# Patient Record
Sex: Male | Born: 1940 | Race: White | Hispanic: No | Marital: Married | Smoking: Former smoker
Health system: Southern US, Community
[De-identification: ages and names within clinical notes are randomized; demographics above are authoritative.]

## PROBLEM LIST (undated history)

## (undated) DIAGNOSIS — I82409 Acute embolism and thrombosis of unspecified deep veins of unspecified lower extremity: Secondary | ICD-10-CM

## (undated) DIAGNOSIS — I4891 Unspecified atrial fibrillation: Secondary | ICD-10-CM

## (undated) DIAGNOSIS — I472 Ventricular tachycardia, unspecified: Secondary | ICD-10-CM

## (undated) DIAGNOSIS — E785 Hyperlipidemia, unspecified: Secondary | ICD-10-CM

## (undated) DIAGNOSIS — Z951 Presence of aortocoronary bypass graft: Secondary | ICD-10-CM

## (undated) DIAGNOSIS — T82198A Other mechanical complication of other cardiac electronic device, initial encounter: Secondary | ICD-10-CM

## (undated) DIAGNOSIS — I2510 Atherosclerotic heart disease of native coronary artery without angina pectoris: Secondary | ICD-10-CM

## (undated) DIAGNOSIS — F32A Depression, unspecified: Secondary | ICD-10-CM

## (undated) DIAGNOSIS — I739 Peripheral vascular disease, unspecified: Secondary | ICD-10-CM

## (undated) DIAGNOSIS — Z95810 Presence of automatic (implantable) cardiac defibrillator: Secondary | ICD-10-CM

## (undated) DIAGNOSIS — I4892 Unspecified atrial flutter: Secondary | ICD-10-CM

## (undated) DIAGNOSIS — F329 Major depressive disorder, single episode, unspecified: Secondary | ICD-10-CM

## (undated) DIAGNOSIS — I639 Cerebral infarction, unspecified: Secondary | ICD-10-CM

## (undated) DIAGNOSIS — E669 Obesity, unspecified: Secondary | ICD-10-CM

## (undated) DIAGNOSIS — E119 Type 2 diabetes mellitus without complications: Secondary | ICD-10-CM

## (undated) HISTORY — DX: Cerebral infarction, unspecified: I63.9

## (undated) HISTORY — DX: Atherosclerotic heart disease of native coronary artery without angina pectoris: I25.10

## (undated) HISTORY — DX: Ventricular tachycardia, unspecified: I47.20

## (undated) HISTORY — DX: Unspecified atrial flutter: I48.92

## (undated) HISTORY — PX: CATARACT EXTRACTION: SUR2

## (undated) HISTORY — DX: Ventricular tachycardia: I47.2

## (undated) HISTORY — DX: Presence of automatic (implantable) cardiac defibrillator: Z95.810

## (undated) HISTORY — DX: Peripheral vascular disease, unspecified: I73.9

## (undated) HISTORY — DX: Unspecified atrial fibrillation: I48.91

## (undated) HISTORY — DX: Presence of aortocoronary bypass graft: Z95.1

## (undated) HISTORY — DX: Obesity, unspecified: E66.9

## (undated) HISTORY — PX: AORTIC VALVE REPLACEMENT (AVR)/CORONARY ARTERY BYPASS GRAFTING (CABG): SHX5725

## (undated) HISTORY — DX: Hyperlipidemia, unspecified: E78.5

## (undated) HISTORY — DX: Type 2 diabetes mellitus without complications: E11.9

## (undated) HISTORY — DX: Acute embolism and thrombosis of unspecified deep veins of unspecified lower extremity: I82.409

## (undated) HISTORY — DX: Major depressive disorder, single episode, unspecified: F32.9

## (undated) HISTORY — DX: Depression, unspecified: F32.A

## (undated) HISTORY — DX: Other mechanical complication of other cardiac electronic device, initial encounter: T82.198A

---

## 1995-08-17 HISTORY — PX: CARDIAC CATHETERIZATION: SHX172

## 2001-12-17 ENCOUNTER — Ambulatory Visit (HOSPITAL_COMMUNITY): Admission: RE | Admit: 2001-12-17 | Discharge: 2001-12-17 | Payer: Self-pay | Admitting: Internal Medicine

## 2002-01-01 ENCOUNTER — Encounter: Payer: Self-pay | Admitting: Internal Medicine

## 2002-01-01 ENCOUNTER — Ambulatory Visit (HOSPITAL_COMMUNITY): Admission: RE | Admit: 2002-01-01 | Discharge: 2002-01-01 | Payer: Self-pay | Admitting: Internal Medicine

## 2002-01-02 ENCOUNTER — Ambulatory Visit (HOSPITAL_COMMUNITY): Admission: RE | Admit: 2002-01-02 | Discharge: 2002-01-02 | Payer: Self-pay | Admitting: Internal Medicine

## 2002-06-20 ENCOUNTER — Encounter: Payer: Self-pay | Admitting: Emergency Medicine

## 2002-06-20 ENCOUNTER — Inpatient Hospital Stay (HOSPITAL_COMMUNITY): Admission: EM | Admit: 2002-06-20 | Discharge: 2002-06-22 | Payer: Self-pay | Admitting: Emergency Medicine

## 2002-11-28 ENCOUNTER — Encounter: Payer: Self-pay | Admitting: Internal Medicine

## 2002-11-28 ENCOUNTER — Ambulatory Visit (HOSPITAL_COMMUNITY): Admission: RE | Admit: 2002-11-28 | Discharge: 2002-11-28 | Payer: Self-pay | Admitting: Internal Medicine

## 2008-06-07 ENCOUNTER — Inpatient Hospital Stay (HOSPITAL_COMMUNITY): Admission: EM | Admit: 2008-06-07 | Discharge: 2008-06-08 | Payer: Self-pay | Admitting: Emergency Medicine

## 2008-06-07 ENCOUNTER — Ambulatory Visit: Payer: Self-pay | Admitting: Internal Medicine

## 2008-07-13 ENCOUNTER — Ambulatory Visit: Payer: Self-pay | Admitting: Internal Medicine

## 2008-07-13 LAB — CONVERTED CEMR LAB
BUN: 17 mg/dL (ref 6–23)
Creatinine, Ser: 0.7 mg/dL (ref 0.4–1.5)
GFR calc non Af Amer: 120 mL/min

## 2009-12-16 DIAGNOSIS — I639 Cerebral infarction, unspecified: Secondary | ICD-10-CM

## 2009-12-16 HISTORY — DX: Cerebral infarction, unspecified: I63.9

## 2010-01-03 ENCOUNTER — Ambulatory Visit: Payer: Self-pay | Admitting: Internal Medicine

## 2010-01-03 ENCOUNTER — Inpatient Hospital Stay (HOSPITAL_COMMUNITY): Admission: EM | Admit: 2010-01-03 | Discharge: 2010-01-12 | Payer: Self-pay | Admitting: Emergency Medicine

## 2010-01-04 ENCOUNTER — Encounter: Payer: Self-pay | Admitting: Internal Medicine

## 2010-01-05 ENCOUNTER — Encounter: Payer: Self-pay | Admitting: Internal Medicine

## 2010-01-05 ENCOUNTER — Ambulatory Visit: Payer: Self-pay | Admitting: Physical Medicine & Rehabilitation

## 2010-01-11 ENCOUNTER — Encounter: Payer: Self-pay | Admitting: Internal Medicine

## 2010-01-12 ENCOUNTER — Telehealth (INDEPENDENT_AMBULATORY_CARE_PROVIDER_SITE_OTHER): Payer: Self-pay | Admitting: *Deleted

## 2010-01-13 ENCOUNTER — Encounter: Payer: Self-pay | Admitting: Internal Medicine

## 2010-01-23 ENCOUNTER — Inpatient Hospital Stay (HOSPITAL_COMMUNITY): Admission: EM | Admit: 2010-01-23 | Discharge: 2010-01-26 | Payer: Self-pay | Admitting: Emergency Medicine

## 2010-01-23 ENCOUNTER — Telehealth (INDEPENDENT_AMBULATORY_CARE_PROVIDER_SITE_OTHER): Payer: Self-pay | Admitting: *Deleted

## 2010-01-23 ENCOUNTER — Ambulatory Visit: Payer: Self-pay | Admitting: Internal Medicine

## 2010-01-24 ENCOUNTER — Encounter (INDEPENDENT_AMBULATORY_CARE_PROVIDER_SITE_OTHER): Payer: Self-pay | Admitting: Neurology

## 2010-01-26 ENCOUNTER — Ambulatory Visit: Payer: Self-pay | Admitting: Physical Medicine & Rehabilitation

## 2010-01-26 ENCOUNTER — Inpatient Hospital Stay (HOSPITAL_COMMUNITY)
Admission: RE | Admit: 2010-01-26 | Discharge: 2010-02-06 | Payer: Self-pay | Admitting: Physical Medicine & Rehabilitation

## 2010-02-03 ENCOUNTER — Ambulatory Visit: Payer: Self-pay | Admitting: Physical Medicine & Rehabilitation

## 2010-02-21 ENCOUNTER — Ambulatory Visit (HOSPITAL_COMMUNITY)
Admission: RE | Admit: 2010-02-21 | Discharge: 2010-02-21 | Payer: Self-pay | Admitting: Physical Medicine & Rehabilitation

## 2010-03-08 ENCOUNTER — Encounter
Admission: RE | Admit: 2010-03-08 | Discharge: 2010-05-16 | Payer: Self-pay | Admitting: Physical Medicine & Rehabilitation

## 2010-03-14 ENCOUNTER — Ambulatory Visit: Payer: Self-pay | Admitting: Physical Medicine & Rehabilitation

## 2010-04-18 ENCOUNTER — Telehealth: Payer: Self-pay | Admitting: Internal Medicine

## 2010-04-18 ENCOUNTER — Ambulatory Visit: Payer: Self-pay | Admitting: Internal Medicine

## 2010-04-18 DIAGNOSIS — I4891 Unspecified atrial fibrillation: Secondary | ICD-10-CM

## 2010-04-18 DIAGNOSIS — I472 Ventricular tachycardia: Secondary | ICD-10-CM

## 2010-04-27 ENCOUNTER — Telehealth: Payer: Self-pay | Admitting: Internal Medicine

## 2010-05-16 ENCOUNTER — Ambulatory Visit: Payer: Self-pay | Admitting: Physical Medicine & Rehabilitation

## 2010-05-23 ENCOUNTER — Encounter: Admission: RE | Admit: 2010-05-23 | Discharge: 2010-05-23 | Payer: Self-pay

## 2010-07-20 NOTE — Progress Notes (Signed)
Summary: can pt take a shower -2 days post op?  Phone Note Call from Patient   Caller: Spouse Reason for Call: Talk to Nurse Summary of Call: pt had surgery on 7-26, pt wants to know if he can  take a shower, if so does he need to keep incision covered? pls advise [phone] Initial call taken by: Glynda Jaeger,  January 12, 2010 2:43 PM  Follow-up for Phone Call        No shower for 1 week post implant, wife aware. Follow-up by: Altha Harm, LPN,  January 12, 2010 3:59 PM

## 2010-07-20 NOTE — Miscellaneous (Signed)
Summary: Device change out  Clinical Lists Changes  Observations: Added new observation of ICDEXPLCOMM: 01/10/10 [employer] [id number] explanted (01/13/2010 8:18) Added new observation of ICD INDICATN: ICM (01/13/2010 8:18) Added new observation of ICDLEADSTAT1: active (01/13/2010 8:18) Added new observation of ICDLEADSER1: 147829  (01/13/2010 8:18) Added new observation of ICDLEADMOD1: 0125  (01/13/2010 8:18) Added new observation of ICDLEADDOI1: 09/12/1995  (01/13/2010 8:18) Added new observation of ICDLEADLOC1: RV  (01/13/2010 8:18) Added new observation of ICD IMP MD: Hillis Range, MD  (01/13/2010 8:18) Added new observation of ICD IMPL DTE: 01/10/2010  (01/13/2010 8:18) Added new observation of ICD SERL#: 562130  (01/13/2010 8:18) Added new observation of ICD MODL#: E102  (01/13/2010 8:18) Added new observation of ICDMANUFACTR: [employer]  (01/13/2010 8:18) Added new observation of ICD MD: Hillis Range, MD  (01/13/2010 8:18)       ICD Specifications Following MD:  Hillis Range, MD     ICD Vendor:  [facility] Scientific     ICD Model Number:  E102     ICD Serial Number:  [id number] ICD DOI:  01/10/2010     ICD Implanting MD:  Hillis Range, MD  Lead 1:    Location: RV     DOI: 09/12/1995     Model #: 0125     Serial #: [id number]     Status: active  Indications::  ICM  Explantation Comments: 01/10/10 [facility] Scientific Prizm [id number] explanted

## 2010-07-20 NOTE — Progress Notes (Signed)
Summary: pt has elevated pulse  Phone Note Call from Patient Call back at Home Phone [phone]   Caller: Spouse Reason for Call: Talk to Nurse, Talk to Doctor Summary of Call: pt pulse has been running between 116 - 122 b/p was 103/64 and she was wondering what to do and if they need to be concerned  Initial call taken by: Omer Jack,  April 27, 2010 10:35 AM  Follow-up for Phone Call        spoke w/spouse and she adv the following: 0740 b/p-82/58 hr 122 0845 b/p 120/81 hr 121 1030 bp 103/64 hr 123 pt denies shob, pain, dizziness, or any other symptoms. blood sugar 137. Adv pt to discuss bp changes with cardiologist Dr. York Spaniel office.   Follow-up by: Claris Gladden RN,  April 27, 2010 11:56 AM

## 2010-07-20 NOTE — Cardiovascular Report (Signed)
Summary: Office Visit   Office Visit   Imported By: Roderic Ovens 04/20/2010 16:13:56  _____________________________________________________________________  External Attachment:    Type:   Image     Comment:   External Document

## 2010-07-20 NOTE — Progress Notes (Signed)
Summary: pt not feeling well/cancel today's appt  Phone Note Call from Patient Call back at Home Phone [phone]   Caller: Patient Reason for Call: Talk to Nurse, Talk to Doctor Summary of Call: pt not feeling well and has been vomiting and will go back to hospital around 1pm when daughter comes home Initial call taken by: Omer Jack,  January 23, 2010 10:49 AM  Follow-up for Phone Call        Left message for patient to call and reschedule wound check when he's feeling better. Follow-up by: Altha Harm, LPN,  January 23, 2010 4:32 PM

## 2010-07-20 NOTE — Assessment & Plan Note (Signed)
Summary: pc2/ gd   Visit Type:  ICD-[employer]   History of Present Illness: Tristan Myers is seen in followup for ischemic cardiomyopathy with previously implanted ICD and history of ventricular tachycardia. He is status post recent generator replacement in July.  This was accomplished during the hospitalization associated with a probable embolic stroke. Coumadin was initiated. 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 He is no longer on Coumadin. He continues to have episodes of atrial fibrillation as detected by his device.  He has a history of bypass surgery with ejection fraction of 20-25% and class II congestive failure .    Current Medications (verified): 1)  Aminofen 325 Mg Tabs (Acetaminophen) .... Evert 4 Hours As Needed 2)  Aspirin 325 Mg Tabs (Aspirin) .... Once Daily 3)  Lantus 100 Unit/ml Soln (Insulin Glargine) .... As Directed 4)  Metformin Hcl 1000 Mg Tabs (Metformin Hcl) .... 1/2 Tablet Two Times A Day 5)  Atenolol 50 Mg Tabs (Atenolol) .... Once Daily 6)  Citalopram Hydrobromide 40 Mg Tabs (Citalopram Hydrobromide) .... Once Daily 7)  Gemfibrozil 600 Mg Tabs (Gemfibrozil) .... Two Times A Day 8)  Lipitor 20 Mg Tabs (Atorvastatin Calcium) 9)  Daily Multiple Vitamins  Tabs (Multiple Vitamin) .... Once Daily 10)  Sotalol Hcl 120 Mg Tabs (Sotalol Hcl) .... Two Times A Day 11)  Benazepril Hcl 10 Mg Tabs (Benazepril Hcl) .... 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 He is slow of response HEENT Normal.  Neck veins were flat, carotids were brisk.  Lungs were clear.  Heart sounds were regular with 2/6 murmur Abdomen was soft with active bowel sounds. There is no clubbing cyanosis or edema. Skin Warm and dry     ICD Specifications Following MD:  Sherryl Manges, MD     ICD Vendor:  [facility] Scientific     ICD Model Number:  E102     ICD Serial Number:  [id number] ICD DOI:  01/10/2010     ICD Implanting MD:  Hillis Range, MD  Lead 1:    Location: RV     DOI: 09/12/1995     Model #: 0125     Serial #: [id number]     Status: active  Indications::  ICM  Explantation Comments: 01/10/10 [facility] Scientific Prizm [id number] explanted  ICD Follow Up Remote Check?  No Charge Time:  8.2 seconds     Battery Est. Longevity:  9 years Underlying rhythm:  SR   ICD Device Measurements Right Ventricle:  Amplitude: 13.5 mV, Impedance: 784 ohms, Threshold:  1.7 V at 1.0 msec Shock Impedance: 54 ohms   Episodes Coumadin:  No Shock:  0     ATP:  0     Nonsustained:  1     Ventricular Pacing:  <1%  Brady Parameters Mode VVI     Lower Rate Limit:  40      Tachy Zones VF:  220     VT:  190     VT1:  165     Next Remote Date:  07/20/2010     Next Cardiology Appt Due:  12/17/2010 Tech Comments:  RV reprogrammed 3.0@1 .0.  Latitude transmissions every 3 months.  ROV 7/12 with Dr. Laurena Spies, LPN  April 18, 2010 9:26 AM   Impression & Recommendations:  Problem # 1:  ATRIAL FIBRILLATION (ICD-427.31) he patient continues to have atrial fibrillation.  QT intervals and laboratories are followed by Dr.  Donnie Aho. He is not a candidate for anticoagulation because of his hemorrhagic stroke His updated medication list for this problem includes:    Aspirin 325 Mg Tabs (Aspirin) ..... Once daily    Atenolol 50 Mg Tabs (Atenolol) ..... 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 Once daily    Atenolol 50 Mg Tabs (Atenolol) ..... Once daily    Sotalol Hcl 120 Mg Tabs (Sotalol hcl) .Marland Kitchen..Marland Kitchen Two times a day    Benazepril Hcl 10 Mg Tabs (Benazepril hcl) ..... Once daily  Problem # 4:  CARDIOMYOPATHY, ISCHEMIC S/P CABG (ICD-414.8) stable on current medications His updated medication list for this problem includes:    Aspirin 325 Mg Tabs (Aspirin) ..... Once daily    Atenolol 50 Mg Tabs (Atenolol) ..... Once daily    Sotalol Hcl 120 Mg Tabs (Sotalol hcl) .Marland Kitchen..Marland Kitchen Two times a day    Benazepril Hcl 10 Mg Tabs (Benazepril hcl) ..... Once daily  Problem # 5:  Hx of CVA ISCHEMIC AND HEMORRHAGIC (ICD-434.91) as above His updated medication list for this problem includes:    Aspirin 325 Mg Tabs (Aspirin) ..... Once daily  Patient Instructions: 1)  Your physician recommends that you continue on your current medications as directed. Please refer to the Current Medication list given to you today. 2)  Your physician wants you to follow-up in:   1 YEAR You will receive a reminder letter in the mail two months in advance. If you don't receive a letter, please call our office to schedule the follow-up appointment.

## 2010-07-20 NOTE — Progress Notes (Signed)
Summary: re pt's medication  Phone Note Call from Patient   Caller: Spouse Summary of Call: pt's benzipril is 10mg  Initial call taken by: Glynda Jaeger,  April 18, 2010 12:16 PM  Follow-up for Phone Call        pt confirmed the mg of medication. no action needed.  Follow-up by: Claris Gladden RN,  April 18, 2010 1:54 PM

## 2010-07-21 ENCOUNTER — Encounter: Payer: Self-pay | Admitting: Internal Medicine

## 2010-08-07 ENCOUNTER — Encounter (INDEPENDENT_AMBULATORY_CARE_PROVIDER_SITE_OTHER): Payer: Self-pay | Admitting: *Deleted

## 2010-08-15 NOTE — Cardiovascular Report (Signed)
Summary: Office Visit Remote   Office Visit Remote   Imported By: Roderic Ovens 08/11/2010 11:46:55  _____________________________________________________________________  External Attachment:    Type:   Image     Comment:   External Document

## 2010-08-15 NOTE — Letter (Signed)
Summary: Remote Device Check  [employer], Main Office  [address]   [place], [place] [address]   Phone: [phone]  Fax: [phone]     August 07, 2010 MRN: [id number]   [facility] [address] [place], [place]  [address]   Dear Mr. Saxe,   Your remote transmission was recieved and reviewed by your physician.  All diagnostics were within normal limits for you.  __X____Your next office visit is scheduled for:  July 2012 with Dr Graciela Husbands. Please call our office to schedule an appointment.    Sincerely,  Vella Kohler

## 2010-08-31 LAB — BASIC METABOLIC PANEL
BUN: 16 mg/dL (ref 6–23)
BUN: 25 mg/dL — ABNORMAL HIGH (ref 6–23)
Calcium: 9.7 mg/dL (ref 8.4–10.5)
Chloride: 103 mEq/L (ref 96–112)
Creatinine, Ser: 1.12 mg/dL (ref 0.4–1.5)
GFR calc non Af Amer: >60 mL/min (ref >60)
GFR calc non Af Amer: >60 mL/min (ref >60)
Glucose, Bld: 154 mg/dL — ABNORMAL HIGH (ref 70–99)
Glucose, Bld: 271 mg/dL — ABNORMAL HIGH (ref 70–99)
Potassium: 4.8 mEq/L (ref 3.5–5.1)
Sodium: 134 mEq/L — ABNORMAL LOW (ref 135–145)

## 2010-08-31 LAB — PREALBUMIN: Prealbumin: 24 mg/dL (ref 18.0–45.0)

## 2010-08-31 LAB — CBC
MCH: 31.4 pg (ref 26.0–34.0)
MCHC: 35.1 g/dL (ref 30.0–36.0)
MCV: 89.5 fL (ref 78.0–100.0)
Platelets: 170 10*3/uL (ref 150–400)
RBC: 4.08 MIL/uL — ABNORMAL LOW (ref 4.22–5.81)
RDW: 13.3 % (ref 11.5–15.5)
WBC: 7.8 10*3/uL (ref 4.0–10.5)

## 2010-08-31 LAB — GLUCOSE, CAPILLARY
Glucose-Capillary: 127 mg/dL — ABNORMAL HIGH (ref 70–99)
Glucose-Capillary: 131 mg/dL — ABNORMAL HIGH (ref 70–99)
Glucose-Capillary: 150 mg/dL — ABNORMAL HIGH (ref 70–99)
Glucose-Capillary: 160 mg/dL — ABNORMAL HIGH (ref 70–99)
Glucose-Capillary: 168 mg/dL — ABNORMAL HIGH (ref 70–99)
Glucose-Capillary: 172 mg/dL — ABNORMAL HIGH (ref 70–99)
Glucose-Capillary: 173 mg/dL — ABNORMAL HIGH (ref 70–99)
Glucose-Capillary: 175 mg/dL — ABNORMAL HIGH (ref 70–99)
Glucose-Capillary: 175 mg/dL — ABNORMAL HIGH (ref 70–99)
Glucose-Capillary: 183 mg/dL — ABNORMAL HIGH (ref 70–99)
Glucose-Capillary: 184 mg/dL — ABNORMAL HIGH (ref 70–99)
Glucose-Capillary: 206 mg/dL — ABNORMAL HIGH (ref 70–99)
Glucose-Capillary: 219 mg/dL — ABNORMAL HIGH (ref 70–99)
Glucose-Capillary: 227 mg/dL — ABNORMAL HIGH (ref 70–99)
Glucose-Capillary: 252 mg/dL — ABNORMAL HIGH (ref 70–99)
Glucose-Capillary: 277 mg/dL — ABNORMAL HIGH (ref 70–99)
Glucose-Capillary: 339 mg/dL — ABNORMAL HIGH (ref 70–99)
Glucose-Capillary: 400 mg/dL — ABNORMAL HIGH (ref 70–99)

## 2010-09-01 LAB — COMPREHENSIVE METABOLIC PANEL
ALT: 26 U/L (ref 0–53)
ALT: 29 U/L (ref 0–53)
AST: 33 U/L (ref 0–37)
AST: 35 U/L (ref 0–37)
Albumin: 3.6 g/dL (ref 3.5–5.2)
Alkaline Phosphatase: 70 U/L (ref 39–117)
CO2: 27 mEq/L (ref 19–32)
CO2: 28 mEq/L (ref 19–32)
Calcium: 9.4 mg/dL (ref 8.4–10.5)
Chloride: 99 mEq/L (ref 96–112)
Creatinine, Ser: 0.86 mg/dL (ref 0.4–1.5)
GFR calc Af Amer: >60 mL/min (ref >60)
GFR calc Af Amer: >60 mL/min (ref >60)
GFR calc non Af Amer: >60 mL/min (ref >60)
Glucose, Bld: 164 mg/dL — ABNORMAL HIGH (ref 70–99)
Potassium: 3.7 mEq/L (ref 3.5–5.1)
Potassium: 3.7 mEq/L (ref 3.5–5.1)
Sodium: 134 mEq/L — ABNORMAL LOW (ref 135–145)
Sodium: 134 mEq/L — ABNORMAL LOW (ref 135–145)
Total Bilirubin: 1.2 mg/dL (ref 0.3–1.2)
Total Protein: 7.5 g/dL (ref 6.0–8.3)

## 2010-09-01 LAB — GLUCOSE, CAPILLARY
Glucose-Capillary: 106 mg/dL — ABNORMAL HIGH (ref 70–99)
Glucose-Capillary: 107 mg/dL — ABNORMAL HIGH (ref 70–99)
Glucose-Capillary: 108 mg/dL — ABNORMAL HIGH (ref 70–99)
Glucose-Capillary: 162 mg/dL — ABNORMAL HIGH (ref 70–99)
Glucose-Capillary: 166 mg/dL — ABNORMAL HIGH (ref 70–99)
Glucose-Capillary: 168 mg/dL — ABNORMAL HIGH (ref 70–99)
Glucose-Capillary: 173 mg/dL — ABNORMAL HIGH (ref 70–99)
Glucose-Capillary: 64 mg/dL — ABNORMAL LOW (ref 70–99)
Glucose-Capillary: 70 mg/dL (ref 70–99)
Glucose-Capillary: 81 mg/dL (ref 70–99)
Glucose-Capillary: 83 mg/dL (ref 70–99)
Glucose-Capillary: 92 mg/dL (ref 70–99)

## 2010-09-01 LAB — POCT I-STAT, CHEM 8
BUN: 40 mg/dL — ABNORMAL HIGH (ref 6–23)
Calcium, Ion: 1.14 mmol/L (ref 1.12–1.32)
Creatinine, Ser: 2.5 mg/dL — ABNORMAL HIGH (ref 0.4–1.5)
Glucose, Bld: 141 mg/dL — ABNORMAL HIGH (ref 70–99)
TCO2: 19 mmol/L (ref 0–100)

## 2010-09-01 LAB — URINALYSIS, ROUTINE W REFLEX MICROSCOPIC
Ketones, ur: 15 mg/dL — AB
Leukocytes, UA: NEGATIVE
Nitrite: NEGATIVE
Specific Gravity, Urine: 1.019 (ref 1.005–1.030)
Urobilinogen, UA: 0.2 mg/dL (ref 0.0–1.0)

## 2010-09-01 LAB — URINE MICROSCOPIC-ADD ON

## 2010-09-01 LAB — CBC
HCT: 34.2 % — ABNORMAL LOW (ref 39.0–52.0)
HCT: 37 % — ABNORMAL LOW (ref 39.0–52.0)
HCT: 39 % (ref 39.0–52.0)
Hemoglobin: 11.8 g/dL — ABNORMAL LOW (ref 13.0–17.0)
Hemoglobin: 13.5 g/dL (ref 13.0–17.0)
MCH: 30.8 pg (ref 26.0–34.0)
MCHC: 34.5 g/dL (ref 30.0–36.0)
MCHC: 35.1 g/dL (ref 30.0–36.0)
MCV: 89.8 fL (ref 78.0–100.0)
RDW: 13.3 % (ref 11.5–15.5)
RDW: 13.3 % (ref 11.5–15.5)
RDW: 13.5 % (ref 11.5–15.5)
WBC: 7.4 10*3/uL (ref 4.0–10.5)

## 2010-09-01 LAB — DIFFERENTIAL
Basophils Relative: 1 % (ref 0–1)
Eosinophils Absolute: 0 10*3/uL (ref 0.0–0.7)
Eosinophils Absolute: 0.1 10*3/uL (ref 0.0–0.7)
Eosinophils Relative: 0 % (ref 0–5)
Eosinophils Relative: 1 % (ref 0–5)
Lymphs Abs: 1.5 10*3/uL (ref 0.7–4.0)
Lymphs Abs: 2.2 10*3/uL (ref 0.7–4.0)
Monocytes Absolute: 0.4 10*3/uL (ref 0.1–1.0)
Monocytes Relative: 8 % (ref 3–12)
Neutrophils Relative %: 61 % (ref 43–77)

## 2010-09-01 LAB — PREPARE FRESH FROZEN PLASMA

## 2010-09-01 LAB — PROTIME-INR: INR: 1.66 — ABNORMAL HIGH (ref 0.00–1.49)

## 2010-09-01 LAB — BASIC METABOLIC PANEL
BUN: 29 mg/dL — ABNORMAL HIGH (ref 6–23)
CO2: 22 mEq/L (ref 19–32)
Calcium: 9.1 mg/dL (ref 8.4–10.5)
GFR calc non Af Amer: >60 mL/min (ref >60)
Glucose, Bld: 149 mg/dL — ABNORMAL HIGH (ref 70–99)
Potassium: 3.7 mEq/L (ref 3.5–5.1)

## 2010-09-01 LAB — URINE CULTURE
Colony Count: NO GROWTH
Culture  Setup Time: 201108082311
Culture: NO GROWTH

## 2010-09-01 LAB — HEPATIC FUNCTION PANEL
Alkaline Phosphatase: 66 U/L (ref 39–117)
Bilirubin, Direct: 0.2 mg/dL (ref 0.0–0.3)
Indirect Bilirubin: 0.7 mg/dL (ref 0.3–0.9)
Total Bilirubin: 0.9 mg/dL (ref 0.3–1.2)
Total Protein: 6.5 g/dL (ref 6.0–8.3)

## 2010-09-01 LAB — POCT CARDIAC MARKERS: Troponin i, poc: <0.05 ng/mL (ref 0.00–0.09)

## 2010-09-01 LAB — LIPASE, BLOOD: Lipase: 75 U/L — ABNORMAL HIGH (ref 11–59)

## 2010-09-02 LAB — GLUCOSE, CAPILLARY
Glucose-Capillary: 108 mg/dL — ABNORMAL HIGH (ref 70–99)
Glucose-Capillary: 133 mg/dL — ABNORMAL HIGH (ref 70–99)
Glucose-Capillary: 152 mg/dL — ABNORMAL HIGH (ref 70–99)
Glucose-Capillary: 165 mg/dL — ABNORMAL HIGH (ref 70–99)
Glucose-Capillary: 175 mg/dL — ABNORMAL HIGH (ref 70–99)
Glucose-Capillary: 176 mg/dL — ABNORMAL HIGH (ref 70–99)
Glucose-Capillary: 182 mg/dL — ABNORMAL HIGH (ref 70–99)
Glucose-Capillary: 184 mg/dL — ABNORMAL HIGH (ref 70–99)
Glucose-Capillary: 185 mg/dL — ABNORMAL HIGH (ref 70–99)
Glucose-Capillary: 185 mg/dL — ABNORMAL HIGH (ref 70–99)
Glucose-Capillary: 186 mg/dL — ABNORMAL HIGH (ref 70–99)
Glucose-Capillary: 187 mg/dL — ABNORMAL HIGH (ref 70–99)
Glucose-Capillary: 193 mg/dL — ABNORMAL HIGH (ref 70–99)
Glucose-Capillary: 201 mg/dL — ABNORMAL HIGH (ref 70–99)
Glucose-Capillary: 201 mg/dL — ABNORMAL HIGH (ref 70–99)
Glucose-Capillary: 202 mg/dL — ABNORMAL HIGH (ref 70–99)
Glucose-Capillary: 215 mg/dL — ABNORMAL HIGH (ref 70–99)
Glucose-Capillary: 220 mg/dL — ABNORMAL HIGH (ref 70–99)
Glucose-Capillary: 224 mg/dL — ABNORMAL HIGH (ref 70–99)
Glucose-Capillary: 235 mg/dL — ABNORMAL HIGH (ref 70–99)
Glucose-Capillary: 279 mg/dL — ABNORMAL HIGH (ref 70–99)

## 2010-09-02 LAB — CBC
HCT: 31.5 % — ABNORMAL LOW (ref 39.0–52.0)
HCT: 32.6 % — ABNORMAL LOW (ref 39.0–52.0)
HCT: 32.9 % — ABNORMAL LOW (ref 39.0–52.0)
HCT: 33 % — ABNORMAL LOW (ref 39.0–52.0)
HCT: 33.5 % — ABNORMAL LOW (ref 39.0–52.0)
HCT: 34 % — ABNORMAL LOW (ref 39.0–52.0)
HCT: 37.5 % — ABNORMAL LOW (ref 39.0–52.0)
Hemoglobin: 10.8 g/dL — ABNORMAL LOW (ref 13.0–17.0)
Hemoglobin: 11.1 g/dL — ABNORMAL LOW (ref 13.0–17.0)
Hemoglobin: 11.3 g/dL — ABNORMAL LOW (ref 13.0–17.0)
Hemoglobin: 11.5 g/dL — ABNORMAL LOW (ref 13.0–17.0)
Hemoglobin: 11.6 g/dL — ABNORMAL LOW (ref 13.0–17.0)
Hemoglobin: 12.7 g/dL — ABNORMAL LOW (ref 13.0–17.0)
MCH: 32.1 pg (ref 26.0–34.0)
MCH: 32.4 pg (ref 26.0–34.0)
MCHC: 34.1 g/dL (ref 30.0–36.0)
MCHC: 34.1 g/dL (ref 30.0–36.0)
MCHC: 34.1 g/dL (ref 30.0–36.0)
MCHC: 34.8 g/dL (ref 30.0–36.0)
MCV: 93.9 fL (ref 78.0–100.0)
MCV: 94.3 fL (ref 78.0–100.0)
MCV: 95 fL (ref 78.0–100.0)
MCV: 95.1 fL (ref 78.0–100.0)
Platelets: 239 10*3/uL (ref 150–400)
Platelets: 254 10*3/uL (ref 150–400)
Platelets: 278 10*3/uL (ref 150–400)
RBC: 3.47 MIL/uL — ABNORMAL LOW (ref 4.22–5.81)
RBC: 3.47 MIL/uL — ABNORMAL LOW (ref 4.22–5.81)
RBC: 3.57 MIL/uL — ABNORMAL LOW (ref 4.22–5.81)
RBC: 3.57 MIL/uL — ABNORMAL LOW (ref 4.22–5.81)
RBC: 3.66 MIL/uL — ABNORMAL LOW (ref 4.22–5.81)
RBC: 3.95 MIL/uL — ABNORMAL LOW (ref 4.22–5.81)
RDW: 12.6 % (ref 11.5–15.5)
RDW: 13 % (ref 11.5–15.5)
RDW: 13.1 % (ref 11.5–15.5)
WBC: 12.2 10*3/uL — ABNORMAL HIGH (ref 4.0–10.5)
WBC: 7.3 10*3/uL (ref 4.0–10.5)
WBC: 7.4 10*3/uL (ref 4.0–10.5)
WBC: 8.4 10*3/uL (ref 4.0–10.5)
WBC: 9.3 10*3/uL (ref 4.0–10.5)
WBC: 9.7 10*3/uL (ref 4.0–10.5)

## 2010-09-02 LAB — URINALYSIS, ROUTINE W REFLEX MICROSCOPIC
Glucose, UA: 500 mg/dL — AB
Hgb urine dipstick: NEGATIVE
Ketones, ur: 15 mg/dL — AB
Leukocytes, UA: NEGATIVE
Protein, ur: 100 mg/dL — AB
pH: 6 (ref 5.0–8.0)

## 2010-09-02 LAB — BASIC METABOLIC PANEL
BUN: 10 mg/dL (ref 6–23)
BUN: 12 mg/dL (ref 6–23)
CO2: 24 mEq/L (ref 19–32)
Calcium: 8.6 mg/dL (ref 8.4–10.5)
Calcium: 8.8 mg/dL (ref 8.4–10.5)
Creatinine, Ser: 0.81 mg/dL (ref 0.4–1.5)
GFR calc Af Amer: >60 mL/min (ref >60)
GFR calc Af Amer: >60 mL/min (ref >60)
GFR calc Af Amer: >60 mL/min (ref >60)
GFR calc non Af Amer: >60 mL/min (ref >60)
GFR calc non Af Amer: >60 mL/min (ref >60)
GFR calc non Af Amer: >60 mL/min (ref >60)
Glucose, Bld: 170 mg/dL — ABNORMAL HIGH (ref 70–99)
Potassium: 3.9 mEq/L (ref 3.5–5.1)
Potassium: 4.2 mEq/L (ref 3.5–5.1)
Sodium: 134 mEq/L — ABNORMAL LOW (ref 135–145)

## 2010-09-02 LAB — PROTIME-INR
INR: 1.21 (ref 0.00–1.49)
INR: 1.37 (ref 0.00–1.49)
INR: 1.74 — ABNORMAL HIGH (ref 0.00–1.49)
Prothrombin Time: 14.8 seconds (ref 11.6–15.2)
Prothrombin Time: 16.3 seconds — ABNORMAL HIGH (ref 11.6–15.2)
Prothrombin Time: 16.6 seconds — ABNORMAL HIGH (ref 11.6–15.2)
Prothrombin Time: 16.8 seconds — ABNORMAL HIGH (ref 11.6–15.2)

## 2010-09-02 LAB — LIPID PANEL
Cholesterol: 116 mg/dL (ref 0–200)
LDL Cholesterol: 56 mg/dL (ref 0–99)
Triglycerides: 122 mg/dL (ref <150)

## 2010-09-02 LAB — COMPREHENSIVE METABOLIC PANEL
AST: 25 U/L (ref 0–37)
Albumin: 3.7 g/dL (ref 3.5–5.2)
Calcium: 9.1 mg/dL (ref 8.4–10.5)
Creatinine, Ser: 0.66 mg/dL (ref 0.4–1.5)
GFR calc Af Amer: >60 mL/min (ref >60)
Total Protein: 7.1 g/dL (ref 6.0–8.3)

## 2010-09-02 LAB — DIFFERENTIAL
Basophils Absolute: 0 10*3/uL (ref 0.0–0.1)
Lymphocytes Relative: 9 % — ABNORMAL LOW (ref 12–46)
Lymphs Abs: 1.1 10*3/uL (ref 0.7–4.0)
Neutro Abs: 10.5 10*3/uL — ABNORMAL HIGH (ref 1.7–7.7)

## 2010-09-02 LAB — URINE MICROSCOPIC-ADD ON

## 2010-09-02 LAB — TROPONIN I: Troponin I: 0.01 ng/mL (ref 0.00–0.06)

## 2010-09-02 LAB — APTT: aPTT: 32 seconds (ref 24–37)

## 2010-09-02 LAB — CK TOTAL AND CKMB (NOT AT ARMC)
CK, MB: 0.5 ng/mL (ref 0.3–4.0)
Relative Index: INVALID (ref 0.0–2.5)

## 2010-09-02 LAB — MAGNESIUM: Magnesium: 2.1 mg/dL (ref 1.5–2.5)

## 2010-10-31 NOTE — Discharge Summary (Signed)
[name], Myers NO.:  [id number]   MEDICAL RECORD NO.:  [id number]          PATIENT TYPE:  INP   LOCATION:  2016                         FACILITY:  [facility]   PHYSICIAN:  Georga Hacking, M.D.DATE OF BIRTH:  [birth year]   DATE OF ADMISSION:  06/07/2008  DATE OF DISCHARGE:  06/08/2008                               DISCHARGE SUMMARY   FINAL DIAGNOSES:  1. Implantable defibrillator discharge due to atrial arrhythmia.  2. Coronary artery disease with previous ischemic cardiomyopathy with      ejection fraction of 25-30%.  3. Previous bypass grafting for severe three-vessel coronary artery      disease.  4. History of ventricular tachycardia.  5. Diabetes mellitus.  6. Glaucoma.  7. Hyperlipidemia.   CONSULTATIONS:  Duke Salvia, MD, [facility]   HISTORY:  This 70 year old male has a previous history of coronary  artery disease and ischemic cardiomyopathy in March 1997.  He had severe  three-vessel coronary artery disease and had bypass grafting with the  mammary graft to the LAD, a vein graft to the obtuse marginal, and a  vein graft to the right coronary artery.  He developed ventricular  tachycardia postoperatively and ICD implanted.  He had replacement of  his ICD with a Guidant ICD device in July 2003.  He developed ICD  discharges in 2004, was transiently admitted and placed on increased  beta-blockers and later by sotalol by Dr. Graciela Husbands.  He was recently seen  in November after a long absence when we would not refill his  medications.  At the day of admission, he was out working in the yard  clearing snow and moving some wood and developed midsternal discomfort  that radiated up into his jaw.  He then had a defibrillator discharge  that terminated the pain.  He was brought to the hospital and was  feeling well at the present time and was admitted.  Please see the  previously dictated history and physical for remainder of the details.   HOSPITAL COURSE:   Laboratory data on admission shows a hemoglobin of  11.5 and hematocrit of 35.2.  PT/PTT were normal.  Chemistry panel shows  a glucose of 208, SGOT of 60, hemoglobin A1c is 8.4.  CPK and troponins  were all negative.  EKG showed right bundle-branch block and left axis  deviation.  Chest x-ray showed cardiomegaly.  The patient was admitted  and had no further episodes of defibrillator discharges.  His ICD was  interrogated and it appeared that he had an atrial arrhythmia that was  rapid, then was accelerated by antitachycardia pacemaking, and then  degenerated into ventricular flutter.  Dr. Graciela Husbands saw the patient in  consultation and felt this was the primary mechanism for his arrhythmia.  The patient had not taken his medications the day that all of this  happened.  It was hypothesized that perhaps this could have aggravated a  problem.  He does have some dyspnea with exertion and may need  outpatient evaluation of his coronary status.   Dr. Graciela Husbands reprogrammed his defibrillator to avoid inappropriate shocks  and in addition, programmed the diagnostic modes to determine how much  tachycardia he is having.  He is discharged at this time in improved  condition on:  1. Sotalol 120 mg b.i.d.  2. Atenolol 50 mg b.i.d.  3. Glucotrol 10 mg b.i.d.  4. Metformin 1000 mg b.i.d.  5. Lycopene 150 mg daily.  6. Lipitor 20 mg daily.  7. Niacin 500 mg daily.  8. Benazepril 10 mg daily.  9. Aspirin 81 mg daily.   He is to see Dr. Graciela Husbands in followup in 1 month to determine how much  arrhythmia he is having and is to see me in followup afterwards.      Georga Hacking, M.D.  Electronically Signed     WST/MEDQ  D:  06/08/2008  T:  06/09/2008  Job:  [id number]   cc:   Windle Guard, M.D.  Duke Salvia, MD, [facility]

## 2010-10-31 NOTE — H&P (Signed)
NAME:  Tristan Myers, Tristan Myers NO.:  [id number]   MEDICAL RECORD NO.:  [id number]          PATIENT TYPE:  EMS   LOCATION:  MAJO                         FACILITY:  [facility]   PHYSICIAN:  Georga Hacking, M.D.DATE OF BIRTH:  [birth year]   DATE OF ADMISSION:  06/07/2008  DATE OF DISCHARGE:                              HISTORY & PHYSICAL   REASON FOR ADMISSION:  Defibrillator went off.   HISTORY:  This 70 year old male has a previous history of coronary  artery disease with a non-Q-wave infarction in March 1997.  He underwent  catheterization showing severe three-vessel coronary artery disease and  had bypass graft with a mammary graft to the LAD, a vein graft to the  obtuse marginal and a vein graft to  the right coronary artery.  He had  ventricular tachycardia postoperatively and had an ICD implanted.  He  failed sotalol therapy on initial EP study.  He had replacement of his  ICD with a Guidant ICD device in July 2003.  He developed ICD discharges  in 2004, and was transiently admitted and placed on increased beta-  blockers.  He has had sporadic follow-up due his desire not to come to  the doctor and, in fact, was seen just this fall after a long absence  because we would not refill his medications.  At that time, he still had  battery life remaining on his defibrillator.  Today, he was out working  in the yard clearing snow and pharynx and moving some wood and developed  some mid sternal discomfort that radiated up into his jaw.  He then had  a defibrillator discharge that terminated the pain.  He was brought to  the hospital and is feeling fine at the present time.  His EKG is  nonacute and initial enzymes are negative.  He has not had any recent  episodes of tachycardia or chest pain.   PAST HISTORY:  1. Atherosclerotic coronary vascular disease.  2. Ventricular tachycardia.  3. Peripheral vascular disease.  4. Hyperlipidemia.  5. Glaucoma.  6. COPD.  7. Previous  diabetes.   PAST SURGERY:  Coronary bypass grafting.   ALLERGIES:  NONE.   CURRENT MEDICATIONS:  1. Lipitor 20 mg daily.  2. __________150 mg daily.  3. Atenolol 50 mg daily.  4. Sotalol 120 mg b.i.d.  5. Benazepril 10 mg daily.  6. Lipitor 20 mg daily.   FAMILY HISTORY:  Father died at age 61 of a myocardial infarction.  Mother died at age 7 with diabetes.   SOCIAL HISTORY:  He is currently retired.  He quit smoking in 1997.  Denies alcohol use.  He has been under situational stress with a  daughter in the hospital with severe complications of diabetes.   REVIEW OF SYSTEMS:  He has been obese for several years.  No recent ear,  nose or throat complaints.  No difficulty swallowing.  Mild dyspnea on  exertion.  He has no gastrointestinal complaints.  He has occasional  nocturia.  He denies edema and has some mild arthritis.  No recent  neurologic events.  PHYSICAL EXAMINATION:  GENERAL:  He is an obese male appearing stated  age in no acute distress.  VITAL SIGNS:  Blood pressure 110/60, pulse is 110 and regular.  SKIN:  Warm and dry.  EENT:  EOMI.  PERRLA.  Fundi unremarkable.  Pharynx negative.  CNS:  Clear.  NECK:  Supple.  No masses, JVD, thyromegaly or bruits.  LUNGS:  Clear to A and P.  CARDIAC:  Normal S1-S2.  No S3 or murmur.  ABDOMEN:  Soft, nontender.  EXTREMITIES:  Femoral and distal pulses are 1+.  NEUROLOGIC:  Normal   LABORATORY DATA:  Shows hemoglobin of 11.5, hematocrit 35.2, sodium 136,  potassium 3.5, chloride 104, CO2 of 22, glucose 208.  SGOT 60.  Initial  enzymes negative.   IMPRESSION:  1. Suspected implantable cardioverter-defibrillator discharge.  2. Coronary artery disease with previous bypass grafting and previous      infarction.  3. Hyperlipidemia on treatment.  4. Diabetes mellitus.  5. History of glaucoma.   RECOMMENDATIONS:  Interrogate defibrillator.  Give subcutaneous heparin  for DVT prophylaxis.  Rule out with serial CK-MBs.   Restart medicines as  he missed a dose earlier today.      Georga Hacking, M.D.  Electronically Signed     WST/MEDQ  D:  06/07/2008  T:  06/07/2008  Job:  [id number]   cc:   Windle Guard, M.D.  Duke Salvia, MD, [facility]

## 2010-10-31 NOTE — Assessment & Plan Note (Signed)
[facility]                         ELECTROPHYSIOLOGY OFFICE NOTE   Tristan Myers, Tristan Myers                        MRN:          [id number]  DATE:07/13/2008                            DOB:          Dec 16, 1940    [name] is seen in followup for ventricular tachycardia in the setting  of ischemic heart disease.  He was hospitalized in December when he  presented with ICD discharges.  This turned out to be an atrial  arrhythmia that was accelerated by anti-tachycardia pacing degenerating  ventricular flutter and thus had a secondary shock.  He has had no  intercurrent episodes.   MEDICATIONS:  1. Sotalol 120 b.i.d.  2. Benazepril 10.  3. Atenolol 50 b.i.d.  4. Glucotrol.  5. Lipitor.  6. Metformin.  7. Aspirin.  8. Niacin.   PHYSICAL EXAMINATION:  VITAL SIGNS:  His blood pressure is 121/71, his  pulse is 62.  His weight was 182, which is stable.  LUNGS:  Clear.  NECK:  Veins were flat.  HEART:  Sounds were regular.  EXTREMITIES:  Without edema.   Interrogation of his Guidant Prizm ICD demonstrates an R-wave of 12.2  with impedance of 561, a threshold of 1.2 at 0.4.  Battery voltage 2.58.  There have been no intercurrent episodes.   IMPRESSION:  1. Ischemic heart disease with:      a.     Prior bypass.      b.     Depressed left ventricular function.  She was status post       implantable cardioverter-defibrillator for the above with remote       appropriate therapy and now appropriate therapy from a secondary       ventricular arrhythmia caused by an atrial arrhythmia.   [name] is stable.  We will plan to continue to see him at Dr. York Spaniel  request.  He will be following with Dr. Donnie Aho as scheduled.    Duke Salvia, MD, [facility]  Electronically Signed   SCK/MedQ  DD: 07/13/2008  DT: 07/14/2008  Job #: [id number]   cc:   Georga Hacking, M.D.

## 2010-10-31 NOTE — Consult Note (Signed)
NAME:  FISHER, HARGADON NO.:  [id number]   MEDICAL RECORD NO.:  [id number]          PATIENT TYPE:  INP   LOCATION:  2016                         FACILITY:  [facility]   PHYSICIAN:  Duke Salvia, MD, FACCDATE OF BIRTH:  [birth year]   DATE OF CONSULTATION:  06/08/2008  DATE OF DISCHARGE:  06/08/2008                                 CONSULTATION   Thanks very much for asking [place] to see Tristan Myers on  electrophysiological consultation because of ICD discharge.   HISTORY OF PRESENT ILLNESS:  Tristan Myers is a 70 year old gentleman whose  remote history is not readily available.  He apparently had bypass  surgery in the mid-to-late 90s in the setting of moderate suppression of  LV function.  In the wake of that he had ventricular tachycardia and had  persistently inducible VT despite efforts with sotalol to suppress it.  He underwent ICD implantation.  He was admitted in 2004 for ICD  discharges.  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  Atenolol dose up-  titration was recommended.  There are no other records in the e-chart  system since 2004.  He presented yesterday with ICD discharges.  He had  been out shoveling snow off his car after the snow storm for about a  minute before ICD runoff, he began to feel lightheaded and as if there  were pressure emanating from his gastric area into his chest.  He then  received the ICD discharge and was feeling fine at that point, he has  ruled out for myocardial infarction.   PAST MEDICAL HISTORY:  1. Notable for thromboembolic risk factors including,      a.     Diabetes.      b.     Congestive heart failure.      c.     Age.      d.     Vascular disease.  2. COPD.  3. Peripheral vascular disease.  4. Hyperlipidemia.  5. Glaucoma.   PAST SURGICAL HISTORY:  As noted previously.   REVIEW OF SYSTEMS:  In addition to the above is notable for,  1. GE reflux  disease.  2. 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  Otherwise,      broadly negative across multiple-organ systems.   MEDICATIONS:  On arrival included,  1. Lipitor 20.  2. Atenolol 50 b.i.d.  3. Sotalol 120.  4. Benazepril 10.  5. Glucotrol 10 b.i.d.  6. Aspirin.   ALLERGIES:  He has no known drug allergies.   SOCIAL HISTORY:  He is retired.  He does not use cigarettes, alcohol, or  recreational drugs.  He lives with his wife.  His daughter is 78 years  old and is in hospital with cardiovascular complications of poorly  controlled diabetes.   PHYSICAL EXAMINATION:  GENERAL:  He is an older Caucasian male appearing  somewhat older than his stated age of 30.  VITAL SIGNS:  Blood pressure is 135/89, pulse was 68.  HEENT:  Demonstrated no icterus or xanthomata.  NECK:  His neck veins were flat.  His carotids were brisk and full.  Bruits were not appreciated.  BACK:  Without kyphosis or scoliosis.  LUNGS:  Clear.  HEART:  Sounds were distant, but were regular.  ABDOMEN:  Soft with active bowel sounds without midline pulsation or  hepatomegaly.  EXTREMITIES:  Femoral pulses were 2+.  Distal pulses were intact.  There  is no clubbing, cyanosis, or edema.  SKIN:  Notable for vitiligo as well as and excoriated rash on his left  neck.  NEUROLOGIC:  Grossly normal.   DIAGNOSTIC STUDIES:  Electrocardiogram dated yesterday showed what was  likely sinus rhythm at a rate of 103 with intervals of 0.13/0.14/0.38.  There is right bundle-branch block and left axis deviation.  Conduction  system was unchanged from 10 years before.   Interrogation of his ICD demonstrated atrial fibrillation being  subjected to antitachycardia pacing in the couple of minutes before he  was shocked.  Therapy was withheld following three attempts because end  of episode was declared.  He then had increasing rapid rates, he was  again submitted for ATP, this resulted in development of sustained   monomorphic ventricular tachycardia/flutter with a cycle length of 220  milliseconds.  High voltage shock was delivered terminating ventricular  flutter and restoring sinus rhythm.   IMPRESSION:  1. Atrial fibrillation with a rapid ventricular response.  2. ATP delivered during atrial fibrillation resulting in the      development of fast ventricular tachycardia/ventricular flutter.      This was appropriately treated by the ICD.  3. Previous history of ventricular tachycardia with sustained      monomorphic that was apparently also rate triggered.  4. Sotalol therapy for question indication.  5. Thromboembolic risk factors notable for,      a.     Diabetes.      b.     Congestive heart failure.      c.     Age.      d.     Vascular disease for CHADS VAS score of 4.  6. Coronary artery disease      a.     Status post coronary artery bypass grafting in 1997.      b.     LV dysfunction, question severity.      c.     Peripheral vascular disease.   DISCUSSION:  Tristan Myers had atrial fibrillation that was inappropriately  targeted for therapy by the ICD.  This resulted in proarrhythmia and  development of ventricular flutter.  ICD therapy have probably  terminated the ventricular flutter.   This is the second time that tachycardia has induced tachycardia,  previously was treated with beta-blocker therapy.   There are a number of issues.  1. How do we preventing inappropriate therapy in the setting of atrial      fibrillation.      a.     To that end, I think augmented rate control would be of       benefit.      b.     Device reprogramming has been unaccomplished,  To increase the rate prior to which therapy is delivered.  To activate SVT discriminators.   The other thing as we talked about on the phone just now was to create a  monitoring zone to try to determine how much atrial fibrillation he is  having and to see whether there is sufficient  atrial fibrillation to  warrant  Coumadin.  This is a tricky question with unclear threshold as  to how much atrial fibrillation wants Coumadin, but there are certain  studies that have said 5 minutes of atrial fibrillation in the setting  of CHADS score of more than 2 is associated with the risk of stroke of  45% and thereby may justify Coumadin.   Further review of the record done over the phone with Dr. Donnie Aho may  include that sotalol was started in the spring of 2004 after the patient  had recurrent VT following hospitalization in January 2004 as best as we  can tell that he has had no intercurrent ventricular tachycardia since  the sotalol was started at that time.  His last ejection fraction in the  2003-2004 range was estimated at 30% plus/minus.   Thank you for the consultation.      Duke Salvia, MD, [facility]  Electronically Signed     SCK/MEDQ  D:  06/08/2008  T:  06/09/2008  Job:  [id number]   cc:   Georga Hacking, M.D.  Windle Guard, M.D.

## 2010-11-03 NOTE — Discharge Summary (Signed)
   NAME:  Tristan Myers, STRAKA NO.:  [id number]   MEDICAL RECORD NO.:  [id number]                   PATIENT TYPE:  INP   LOCATION:  3743                                 FACILITY:  [facility]   PHYSICIAN:  Darden Palmer., M.D.         DATE OF BIRTH:  [birth year]   DATE OF ADMISSION:  06/20/2002  DATE OF DISCHARGE:  06/22/2002                                 DISCHARGE SUMMARY   FINAL DIAGNOSES:  1. Ventricular tachycardia with defibrillator discharges.  2. Previous inferior myocardial infarction.  3. Glucose intolerance.   PROCEDURE:  Treadmill test.   CONSULTATIONS:  Dr. Duke Salvia.   HISTORY:  This is a 70 year old male with a previous posterolateral  myocardial infarction 3/97.  He had bypass grafting and postoperatively he  developed sustained ventricular tachycardia treated with an ICD.  He had his  ICD replaced in 7/03 with a guided 1860 device.  He was doing heavy yard  work and felt light headed and the ICD discharged.  The symptoms recurred 15  minutes later and he was again shocked.  He was brought to the emergency  room and was admitted for further evaluation.  Please see the previously  dictated history and physical for the remainder of the details.   LABS AND STUDIES:  Chest x-ray showed cardiomegaly with an ICD.  EKG shows a  sinus rhythm with a right bundle branch block.  Hemoglobin was 13.2,  hematocrit 38.  Sodium is 138, potassium is 4, glucose is 246, BUN is 20,  creatinine 0.8.  CPK and troponins were negative.   HOSPITAL COURSE:  He was observed over night and enzymes were negative.  He  had a consultation by Dr. Sherryl Manges who felt that he had recurrent  ventricular tachycardia which was monomorphic ventricular tachycardia.  He  favored increasing Atenolol prior to initiation of antiarrhythmic drugs.  He  underwent treadmill testing the day of discharge achieving a heart rate of  105 with peak exercise and no ventricular  tachycardia was seen.   MEDICATIONS:  He was discharged home on his medications to include Atenolol  50 mg b.i.d. and to continue his other medications as before, including  Lipitor 20 mg daily, Lotensin 10 mg daily, multivitamins daily, and aspirin  81 mg daily.   FOLLOW UP:  He is to follow up with me in one week.                                                 Darden Palmer., M.D.    WST/MEDQ  D:  08/11/2002  T:  08/12/2002  Job:  [id number]

## 2010-11-03 NOTE — H&P (Signed)
NAME:  Tristan Myers, Tristan Myers NO.:  [id number]   MEDICAL RECORD NO.:  [id number]                   PATIENT TYPE:  EMS   LOCATION:  MAJO                                 FACILITY:  [facility]   PHYSICIAN:  Peter M. [place], M.D.               DATE OF BIRTH:  1941/06/18   DATE OF ADMISSION:  06/20/2002  DATE OF DISCHARGE:                                HISTORY & PHYSICAL   HISTORY OF PRESENT ILLNESS:  The patient is a 70 year old white male with a  history of coronary artery disease status post non-Q-wave myocardial  infarction in March 1997.  Cardiac catheterization done at [facility] showed severe three-vessel coronary disease and depressed LV  function, although the extent of his LV dysfunction is not clear.  He  underwent coronary artery bypass surgery x 3 with LIMA graft to the LAD,  saphenous vein graft to obtuse marginal branch, and saphenous vein graft to  the right coronary artery.  Postoperatively, he developed sustained  ventricular tachycardia and underwent placement of an ICD.  He was not  placed on anti-arrhythmic therapy and, in fact, at initial EP study, he  failed sotalol therapy.  He denies any history of ICD discharges since then,  although in reviewing Dr. Odessa Fleming notes, this suggests there were two prior  episodes, one of ventricular tachycardia and one of ventricular  fibrillation, although it is unclear when these occurred.  His ICD was  replaced in July of 2003 with a Guidant 1860 device.  Today the patient was  doing heavy yard work when he felt lightheaded for a couple of seconds, then  his ICD discharged.  His symptoms recurred again 15 minutes later, and he  again was shocked.  He had no preceding chest pain, shortness of breath, or  syncope.  ICD interrogation shows two episodes of sustained monomorphic  ventricular tachycardia with a rate of 290 beats per minute, both  successfully stopped with ICD shock.  As noted, his heart  rate was up to 120  prior to these episodes.   PAST MEDICAL HISTORY:  1. Atherosclerotic coronary artery disease status post non-Q-wave myocardial     infarction.  He is status post CABG x 3.  2. Ventricular tachycardia status post ICD implant.  3. Peripheral vascular disease.  4. Hypercholesterolemia.  5. History of COPD.  6. Glaucoma.   ALLERGIES:  No known allergies.   CURRENT MEDICATIONS:  1. Lipitor 20 mg per day.  2. Atenolol 50 mg per day.  3. Lotensin 10 mg per day.  4. Aspirin daily.  5. Multivitamins daily.   FAMILY HISTORY:  Father died at age 69 of myocardial infarction.  Mother  died at age 68 with diabetes.   SOCIAL HISTORY:  The patient quit smoking in 1997, denies alcohol.   PHYSICAL EXAMINATION:  GENERAL:  The patient is a well-developed white male  in  no apparent distress.  VITAL SIGNS:  Blood pressure 116/53, pulse 87 and regular, respirations 18,  temperature afebrile.  HEENT:  Exam is unremarkable.  NECK:  No JVD or bruits.  LUNGS:  Clear.  CARDIAC:  No gallops, murmurs, or rubs.  He has an ICD implanted in his left  subclavicular area.  ABDOMEN:  Soft and nontender.  He has no masses or bruits.  EXTREMITIES:  Without edema.  Pulses 1+ and symmetric.  NEUROLOGIC:  Intact.   LABORATORY DATA:  White count 9700, hemoglobin 13.2, hematocrit 38,  platelets 184,000.   Chest x-ray shows no active disease.  ICD leads are in place.   ECG shows normal sinus rhythm with left axis deviation and right bundle  branch block.   IMPRESSION:  1. Sustained ventricular tachycardia status post ICD discharge x 2.  2. Atherosclerotic coronary artery disease status post coronary artery     bypass grafting x 3.  3. Hyperlipidemia.  4. Peripheral vascular disease.   PLAN:  The patient will be admitted to telemetry.  Will check serial cardiac  enzymes when increases atenolol dose.  Dr. Sherryl Manges has been consulted.  Question need to add additional anti-arrhythmic  drug therapy.                                               Peter M. [place], M.D.    PMJ/MEDQ  D:  06/20/2002  T:  06/20/2002  Job:  [id number]   cc:   Duke Salvia, M.D. [facility]   W. Ashley Royalty., M.D.  [address], [address]  [place]  [place] [address]  Fax: [phone]

## 2010-11-03 NOTE — Procedures (Signed)
[place]  Patient:    TYR, FRANCA Visit Number: [id number] MRN: [id number]          Service Type: CAT Location: [facility] 2853 [id number] Attending Physician:  Nathen May Dictated by:   Nathen May, M.D., [facility] Proc. Date: 12/17/01 Admit Date:  12/17/2001 Discharge Date: 12/17/2001   CC:         Darden Palmer., M.D.  Electrophysiology Laboratory  [facility]   Procedure Report  PREOPERATIVE DIAGNOSIS:  Ventricular tachycardia with a history of coronary artery disease, status post bypass surgery, whose defibrillator has now reached end of life.  POSTOPERATIVE DIAGNOSIS:  Ventricular tachycardia with a history of coronary artery disease, status post bypass surgery, whose defibrillator has now reached end of life.  PROCEDURE:  Defibrillator explantation from the submuscular pocket and defibrillator reimplantation, with intraoperative defibrillation threshold testing.  DESCRIPTION OF PROCEDURE:  Following the obtaining of informed consent, Mr. Martensen was brought to the cardiac catheterization laboratory and placed on the fluoroscopic table in the supine position.  After routine prep and drape, lidocaine was infiltrated along the line of the previous incision and the incision was made, was extended and carried down to the level of the pectoral fascia using sharp dissection.  The pectoral muscles were then carefully separated in the line of the defibrillator and mobilized so as to expose the defibrillator.  The defibrillator had moved laterally and caudally from its initial insertion, and the leads were exposed at the cephalic portion of the pocket.  The leads were freed up and then the incision had to be extended laterally to allow for access to the pocket.  The pocket was then opened and the defibrillator was explanted.  Because of the submuscular nature of this and the displacement of the generator  inferolaterally, this required extensive dissection.  The defibrillator was ultimately freed up.  The lead was analyzed and the R-wave was 13 millivolts with a pacing impedance of 539 Ohms, a pacing threshold at 0.5 msec at 1.4 volts and a currented threshold of 1.9 MA.  The proximal coil unipolar impedance was 560 and the distal coil impedance was 520.  With these acceptable parameters recorded, the pocket was copiously irrigated with antibiotic-containing saline solution and hemostasis was assured, and Surgicel was placed in the cephalad portion of the pocket because of oozing. The leads were then attached to a Guidant Prizm II-VR ICD, model 180, serial number [id number].  Through the device the bipolar R-wave was 8 millivolts with an impedance of 516 Ohms and a pacing threshold of 1.4 volts at 0.5 msec, and high-voltage impedance was 51 Ohms.  It should be parenthetically noted that the leads were freed up from the extensive scar tissue and that as they were freed up, it was noted that there was a hair-turn lie of the leads, creating an S appearance on the cephalad portion of the pocket.  This was expanded but not straightened gently.  With this having been accomplished, the leads and the pulse generator were then placed in the pocket and secured to the medial aspect of the pocket so as to prevent migration.  Ventricular fibrillation was induced via the T-wave shock.  After a total duration of seven seconds, an 11-joule shock was delivered through a measured resistance of 42 Ohms, terminating ventricular fibrillation and restoring sinus rhythm.  At this point the system was implanted.  The pectoral muscles were loosely apposed and the wound was then  closed in three layers in the normal fashion. A Surgicel was applied between the first and second layers of closure, and pressure was applied to make sure that the hemostasis was adequate.  This appeared to be fine, and the last layer was  then closed.  The patient tolerated the procedure well and without difficulty.  The patients defibrillator was reprogrammed as previously, and will be transferred to the holding area in stable condition. Dictated by:   Nathen May, M.D., [facility] Attending Physician:  Nathen May DD:  12/17/01 TD:  12/20/01 Job: [id number]

## 2010-11-23 ENCOUNTER — Encounter: Payer: Self-pay | Admitting: Internal Medicine

## 2011-01-02 ENCOUNTER — Encounter: Payer: Self-pay | Admitting: Internal Medicine

## 2011-01-02 ENCOUNTER — Ambulatory Visit (INDEPENDENT_AMBULATORY_CARE_PROVIDER_SITE_OTHER): Payer: Medicare Other | Admitting: Internal Medicine

## 2011-01-02 DIAGNOSIS — T82198A Other mechanical complication of other cardiac electronic device, initial encounter: Secondary | ICD-10-CM

## 2011-01-02 DIAGNOSIS — I255 Ischemic cardiomyopathy: Secondary | ICD-10-CM | POA: Insufficient documentation

## 2011-01-02 DIAGNOSIS — I4891 Unspecified atrial fibrillation: Secondary | ICD-10-CM

## 2011-01-02 DIAGNOSIS — I472 Ventricular tachycardia: Secondary | ICD-10-CM

## 2011-01-02 DIAGNOSIS — Z95810 Presence of automatic (implantable) cardiac defibrillator: Secondary | ICD-10-CM

## 2011-01-02 DIAGNOSIS — I2589 Other forms of chronic ischemic heart disease: Secondary | ICD-10-CM

## 2011-01-02 NOTE — Assessment & Plan Note (Signed)
Histogram data suggests some intercurrent atrial fibrillation

## 2011-01-02 NOTE — Patient Instructions (Signed)
Your physician wants you to follow-up in: 1 year  You will receive a reminder letter in the mail two months in advance. If you don't receive a letter, please call our office to schedule the follow-up appointment.  Your physician recommends that you continue on your current medications as directed. Please refer to the Current Medication list given to you today.  

## 2011-01-02 NOTE — Assessment & Plan Note (Signed)
Ventricular pacing threshold has doubled since last year. It is less likely a interface issue given the chronicity of the lead. I worry more about her problem near the header. Impedances are all right at this time as his sensing. We will continue to observe

## 2011-01-02 NOTE — Progress Notes (Signed)
  HPI  Tristan Myers is a 70 y.o. male followup for ischemic cardiomyopathy with previously implanted ICD and history of ventricular tachycardia. He is status post recent generator replacement in July 2011 This was accomplished during the hospitalization associated with a probable embolic stroke. Coumadin was initiated. He failed to followup and he subsequenttly  had a hemorrhagic stroke. He is no longer on Coumadin. He continues to have episodes of atrial fibrillation as suggested by his device.  He has a history of bypass surgery with ejection fraction of 20-25% and class II congestive failure .  The patient denies chest pain, shortness of breath, nocturnal dyspnea, orthopnea or peripheral edema.  There have been no palpitations, lightheadedness or syncope.    Past Medical History  Diagnosis Date  . CAD (coronary artery disease)     s/p ICD implant in 1997  . S/P CABG x 1   . Ventricular tachycardia     Hx of it  . 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 Diabetes mellitus, type 2   . PVD (peripheral vascular disease)   . Glaucoma   . 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  Current Outpatient Prescriptions  Medication Sig Dispense Refill  . acetaminophen (TYLENOL) 325 MG tablet Take 650 mg by mouth every 4 (four) hours as needed.        Marland Kitchen aspirin 325 MG tablet Take 325 mg by mouth daily.        Marland Kitchen atenolol (TENORMIN) 50 MG tablet Take 25 mg by mouth daily.       Marland Kitchen atorvastatin (LIPITOR) 20 MG tablet Take 20 mg by mouth daily.        . benazepril (LOTENSIN) 10 MG tablet Take 5 mg by mouth daily.       . citalopram (CELEXA) 40 MG tablet Take 20 mg by mouth daily.       Marland Kitchen gemfibrozil (LOPID) 600 MG tablet Take 600 mg by mouth 2 (two) times daily.        . insulin glargine (LANTUS) 100 UNIT/ML injection Inject 23 Units into the skin at bedtime. UAD      . metFORMIN (GLUMETZA) 1000 MG  (MOD) 24 hr tablet Take 500 mg by mouth 2 (two) times daily.        . Multiple Vitamin (MULTIVITAMIN) tablet Take 1 tablet by mouth daily.        . sotalol (BETAPACE) 120 MG tablet Take 120 mg by mouth 2 (two) times daily.          Allergies  Allergen Reactions  . Betadine (Povidone Iodine)     Review of Systems negative except from HPI and PMH  Physical Exam Well developed and well nourished in no acute distress woth pursed lip breathing HENT normal E scleral and icterus clear Neck Supple JVP 6-7 cm carotids brisk and full Clear to ausculation Regular rate and rhythm, no murmurs gallops or rub Soft with active bowel sounds No clubbing cyanosis and edema Alert and oriented, grossly normal motor and sensory function Skin Warm and Dry    Assessment and  Plan

## 2011-01-02 NOTE — Assessment & Plan Note (Signed)
Nonsustained ventricular tachycardia identified on monitor. He continues on sotalol. Potassium was 4.2 in March.

## 2011-01-02 NOTE — Assessment & Plan Note (Signed)
Stable on current medication.  

## 2011-01-02 NOTE — Assessment & Plan Note (Signed)
The patient's device was interrogated and the information was fully reviewed.  The device was reprogrammed to Account for the increasing ventricular lead pacing threshold

## 2011-03-20 ENCOUNTER — Other Ambulatory Visit: Payer: Self-pay | Admitting: Cardiology

## 2011-03-20 ENCOUNTER — Ambulatory Visit
Admission: RE | Admit: 2011-03-20 | Discharge: 2011-03-20 | Disposition: A | Discharge status: Home / Self Care | Payer: Medicare Other | Source: Ambulatory Visit | Attending: Cardiology | Admitting: Cardiology

## 2011-03-20 DIAGNOSIS — Z95810 Presence of automatic (implantable) cardiac defibrillator: Secondary | ICD-10-CM

## 2011-03-22 LAB — COMPREHENSIVE METABOLIC PANEL
ALT: 45 U/L (ref 0–53)
AST: 60 U/L — ABNORMAL HIGH (ref 0–37)
CO2: 22 mEq/L (ref 19–32)
Calcium: 9.2 mg/dL (ref 8.4–10.5)
GFR calc Af Amer: >60 mL/min (ref >60)
GFR calc non Af Amer: >60 mL/min (ref >60)
Potassium: 3.5 mEq/L (ref 3.5–5.1)
Sodium: 136 mEq/L (ref 135–145)
Total Protein: 6.8 g/dL (ref 6.0–8.3)

## 2011-03-22 LAB — CBC
MCHC: 32.8 g/dL (ref 30.0–36.0)
RBC: 3.68 MIL/uL — ABNORMAL LOW (ref 4.22–5.81)

## 2011-03-22 LAB — DIFFERENTIAL
Eosinophils Absolute: 0.1 10*3/uL (ref 0.0–0.7)
Eosinophils Relative: 1 % (ref 0–5)
Lymphs Abs: 1.4 10*3/uL (ref 0.7–4.0)
Monocytes Relative: 5 % (ref 3–12)

## 2011-03-22 LAB — CARDIAC PANEL(CRET KIN+CKTOT+MB+TROPI)
Relative Index: INVALID (ref 0.0–2.5)
Total CK: 46 U/L (ref 7–232)
Total CK: 46 U/L (ref 7–232)

## 2011-03-22 LAB — B-NATRIURETIC PEPTIDE (CONVERTED LAB): Pro B Natriuretic peptide (BNP): 243 pg/mL — ABNORMAL HIGH (ref 0.0–100.0)

## 2011-03-22 LAB — CK TOTAL AND CKMB (NOT AT ARMC)
CK, MB: 1 ng/mL (ref 0.3–4.0)
Relative Index: INVALID (ref 0.0–2.5)
Total CK: 60 U/L (ref 7–232)

## 2011-03-22 LAB — HEMOGLOBIN A1C: Hgb A1c MFr Bld: 8.4 % — ABNORMAL HIGH (ref 4.6–6.1)

## 2011-04-05 ENCOUNTER — Encounter: Payer: No Typology Code available for payment source | Admitting: *Deleted

## 2011-04-09 ENCOUNTER — Encounter: Payer: Self-pay | Admitting: *Deleted

## 2011-04-19 ENCOUNTER — Other Ambulatory Visit: Payer: Self-pay | Admitting: Internal Medicine

## 2011-04-19 ENCOUNTER — Ambulatory Visit (INDEPENDENT_AMBULATORY_CARE_PROVIDER_SITE_OTHER): Payer: Medicare Other | Admitting: *Deleted

## 2011-04-19 DIAGNOSIS — Z95810 Presence of automatic (implantable) cardiac defibrillator: Secondary | ICD-10-CM

## 2011-04-19 DIAGNOSIS — I472 Ventricular tachycardia: Secondary | ICD-10-CM

## 2011-04-22 LAB — REMOTE ICD DEVICE
BRDY-0002RV: 40 {beats}/min
DEVICE MODEL ICD: 271249
FVT: 0
HV IMPEDENCE: 56 Ohm
PACEART VT: 0
TOT-0006: 20120802000000
TZAT-0001FASTVT: 1
TZAT-0001SLOWVT: 2
TZAT-0002FASTVT: NEGATIVE
TZAT-0002SLOWVT: NEGATIVE
TZAT-0018FASTVT: NEGATIVE
TZAT-0018SLOWVT: NEGATIVE
TZON-0003FASTVT: 315.8 ms
TZON-0003SLOWVT: 363.6 ms
TZST-0001FASTVT: 3
TZST-0001FASTVT: 4
TZST-0001FASTVT: 5
TZST-0001SLOWVT: 3
TZST-0001SLOWVT: 6
TZST-0002SLOWVT: NEGATIVE
TZST-0002SLOWVT: NEGATIVE
TZST-0003FASTVT: 41 J
TZST-0003FASTVT: 41 J
TZST-0003FASTVT: 41 J
TZST-0003FASTVT: 41 J
VENTRICULAR PACING ICD: 0 pct
VF: 0

## 2011-05-04 NOTE — Progress Notes (Signed)
icd remote check  

## 2011-06-05 ENCOUNTER — Encounter: Payer: Self-pay | Admitting: Internal Medicine

## 2011-07-26 ENCOUNTER — Encounter: Payer: Medicare Other | Admitting: *Deleted

## 2011-08-12 IMAGING — CR DG CHEST 1V PORT
1 series · 1 of 1 positions shown · non-contrast
Comparison: 06/07/2008

CLINICAL DATA: Weakness, confusion, stroke

PORTABLE CHEST - 1 VIEW

[AP]
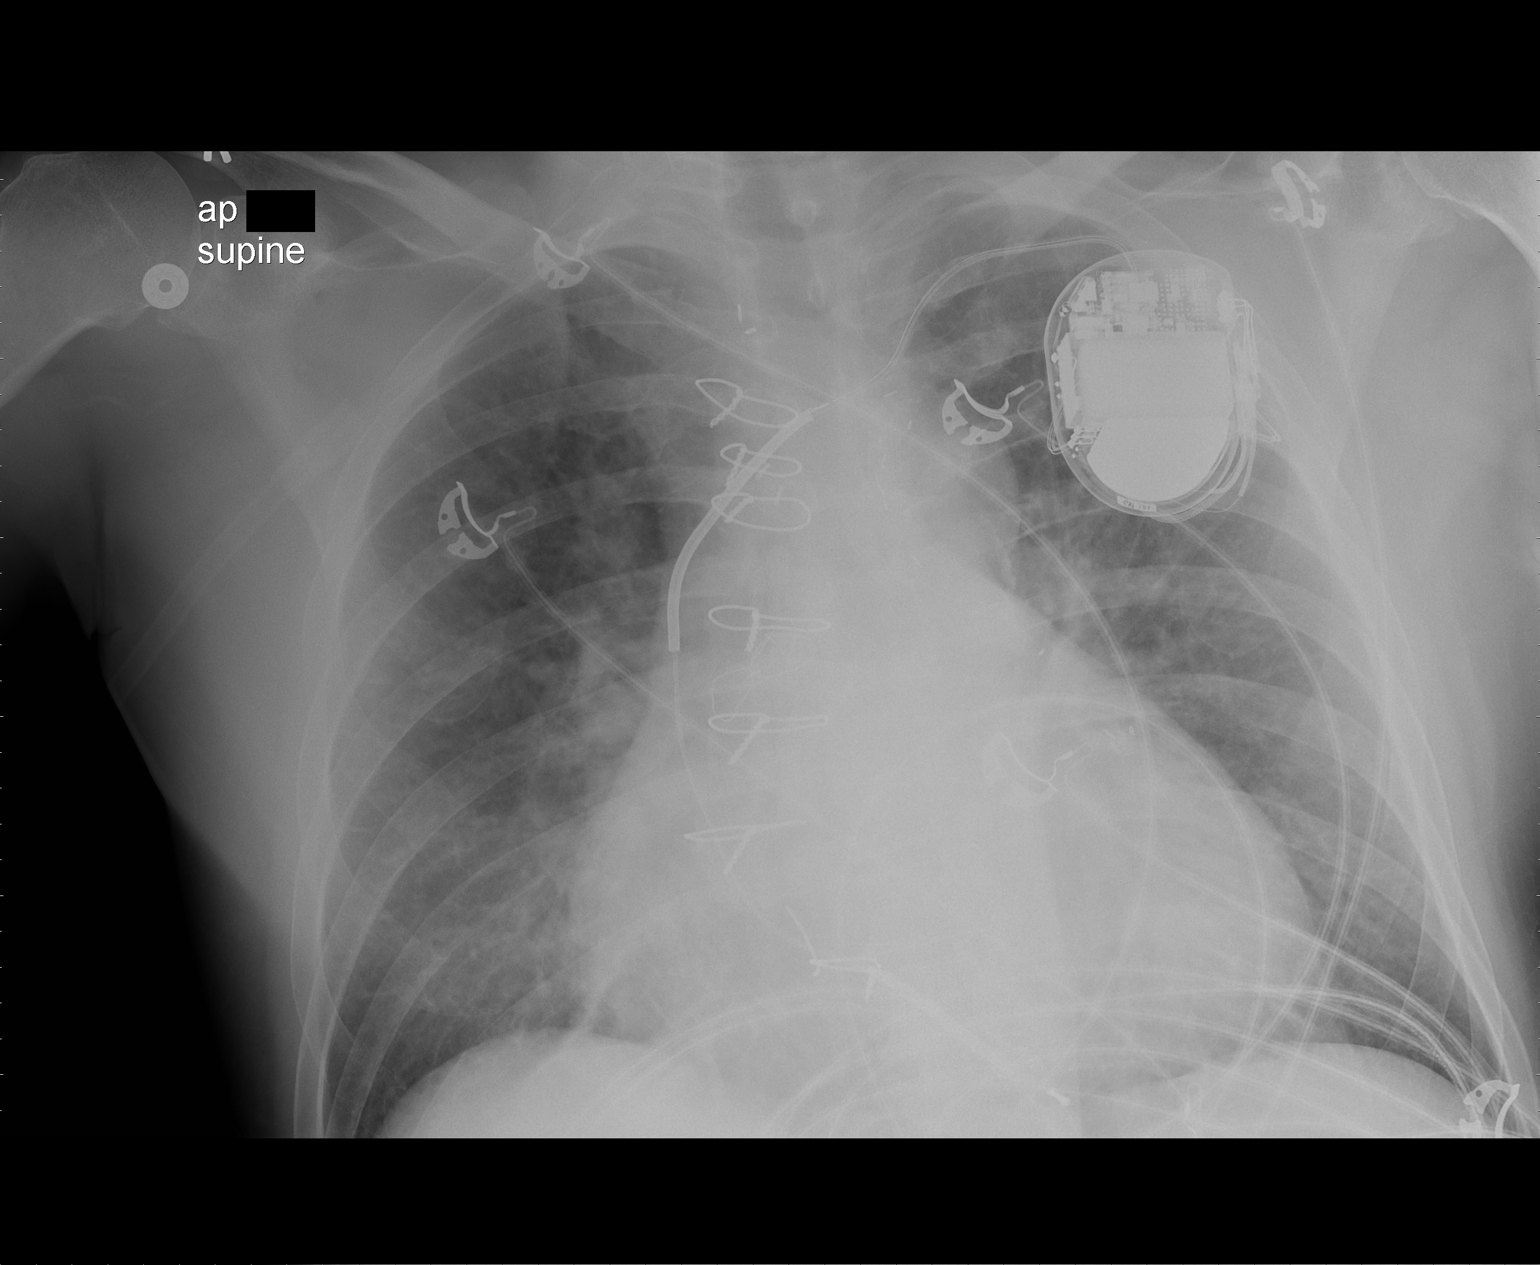

[1 of 1 positions shown; findings below may reference images not displayed]

FINDINGS: Left AICD noted.  Evidence of CABG.  Enlargement of the
cardiomediastinal silhouette is stable with central vascular
congestion and possible interstitial edema but no focal pulmonary
opacity otherwise.  Linear right lower lobe probable scarring or
atelectasis is stable. The patient is rotated to the right, which
may account for apparent displacement of sternal wires, although
correlate with any symptoms referable to this area if there is
concern for sternal dehiscence.
IMPRESSION: Apparent cardiomegaly with central vascular congestion and possible
early interstitial edema.

## 2011-08-20 IMAGING — CR DG CHEST 2V
1 series · 1 of 1 positions shown · non-contrast
Comparison: Portable chest x-ray of 01/03/2010

CLINICAL DATA: Post pacer insertion

CHEST - 2 VIEW

[view not recorded]
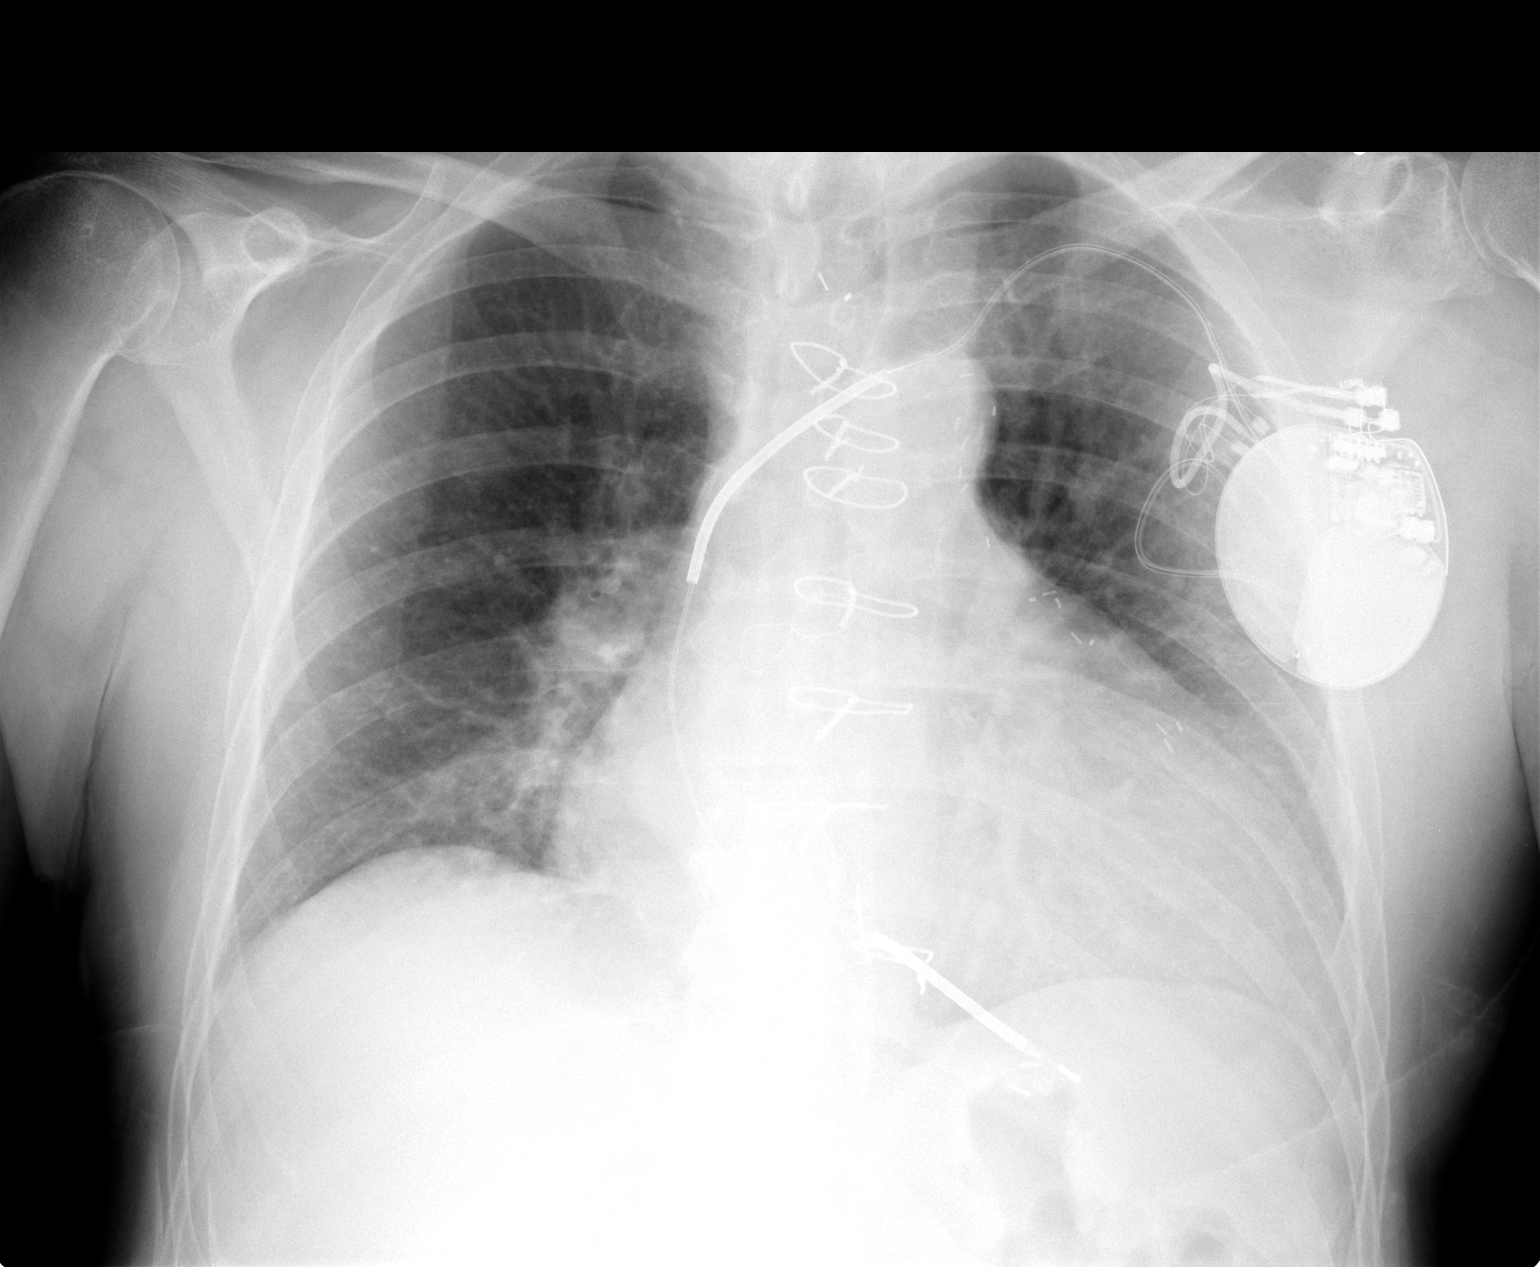

[1 of 1 positions shown; findings below may reference images not displayed]

FINDINGS: The lungs appear better aerated and the pulmonary
vascular congestion has improved.  Moderate cardiomegaly is stable
with mild pulmonary vascular congestion remaining.  A pacer with
AICD lead remains.  Median sternotomy sutures are noted.  No
pleural effusion is seen.
IMPRESSION: Improvement in pulmonary vascular congestion.  Stable cardiomegaly
with pacer and AICD lead.

## 2011-09-02 IMAGING — CT CT HEAD W/O CM
1 series · 15 of 30 positions shown, 19 images · non-contrast
Comparison: 01/23/2010.  01/03/2010.

CLINICAL DATA: 69-year-old male status post stroke.

CT HEAD WITHOUT CONTRAST
TECHNIQUE: Contiguous axial images were obtained from the base of
the skull through the vertex without contrast.

[Series 2: head routine 4.8 h37s · axial · 0.45mm/px · z∈[+1103,+1236]mm · 15 of 30 slices shown, 19 images]
[im 2/30  brain]
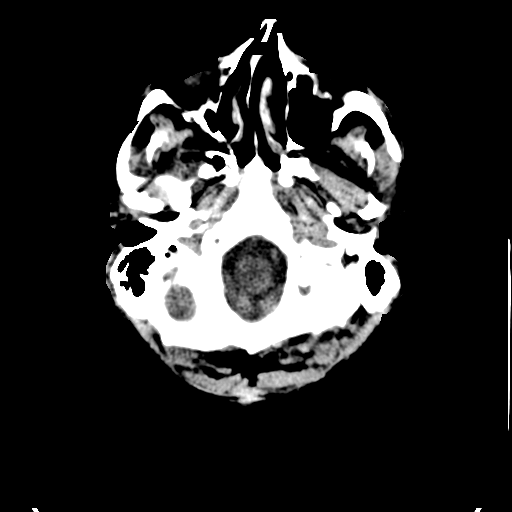
[im 2/30  bone]
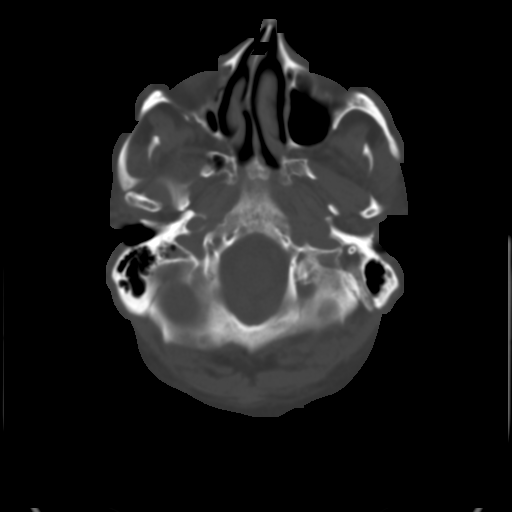
[im 4/30  brain]
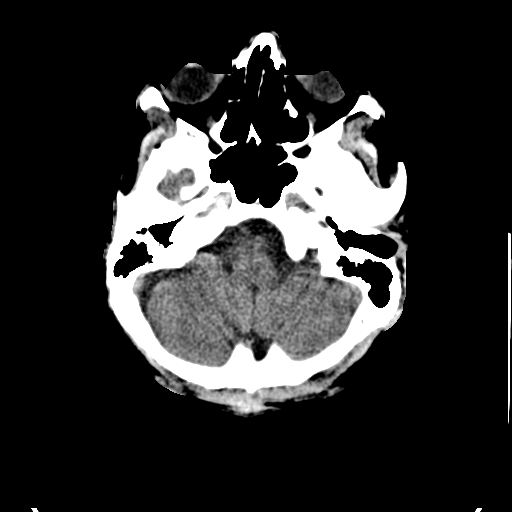
[im 6/30  brain]
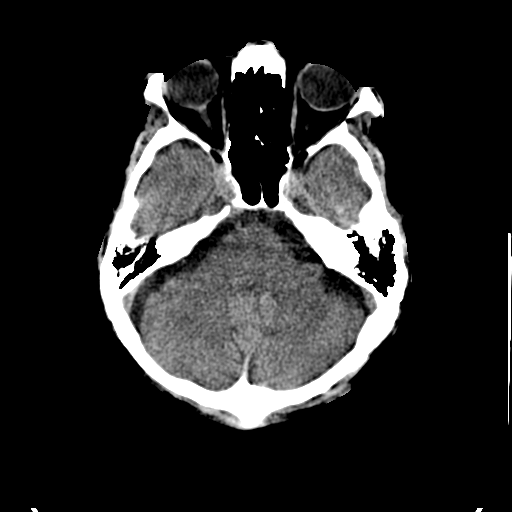
[im 8/30  brain]
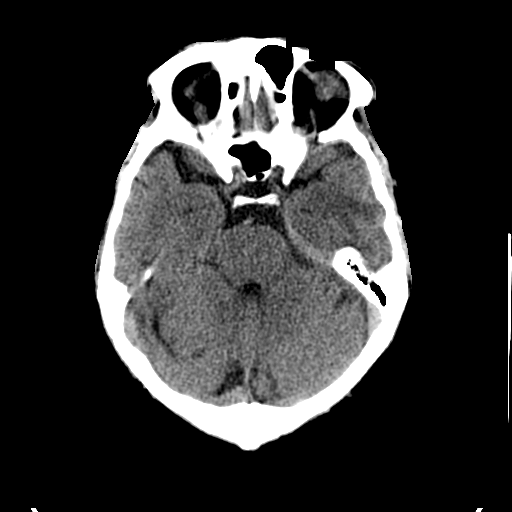
[im 10/30  brain]
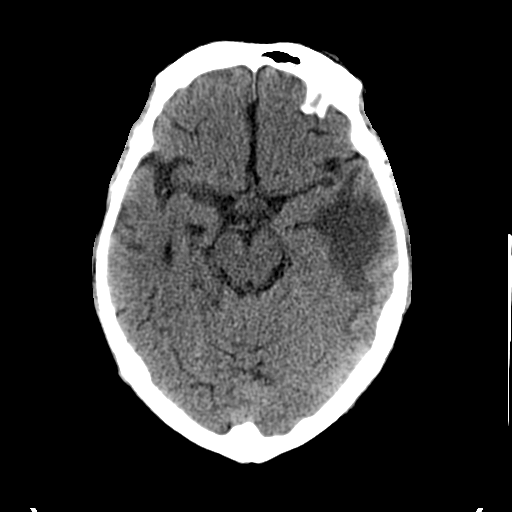
[im 10/30  bone]
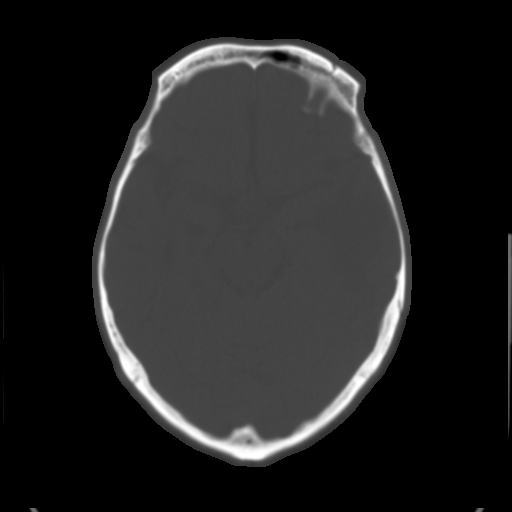
[im 12/30  brain]
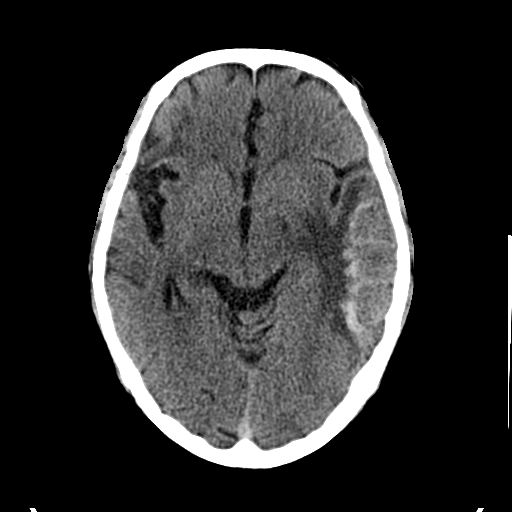
[im 14/30  brain]
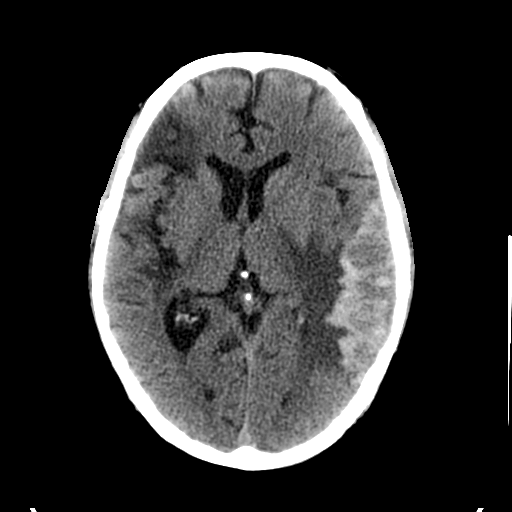
[im 16/30  brain]
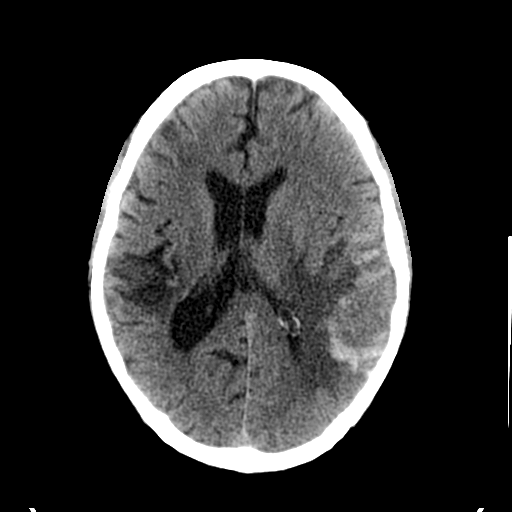
[im 17/30  brain]
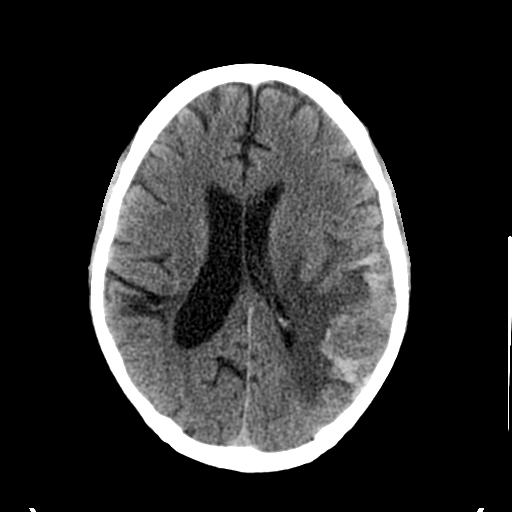
[im 17/30  bone]
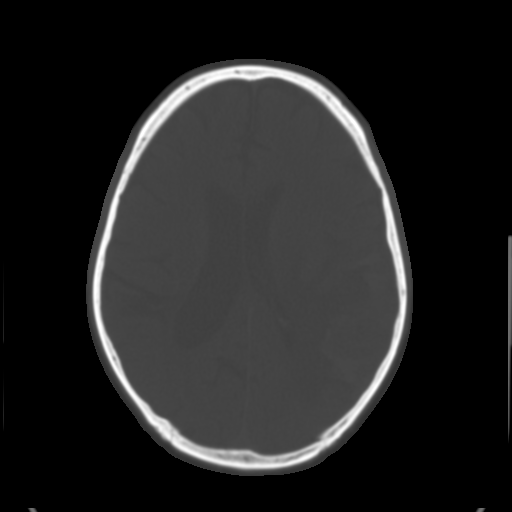
[im 19/30  brain]
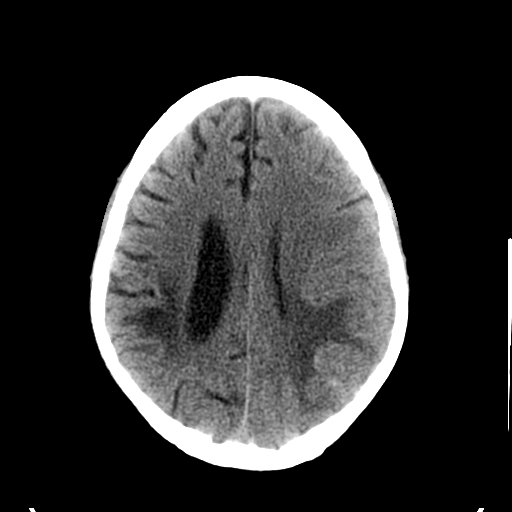
[im 21/30  brain]
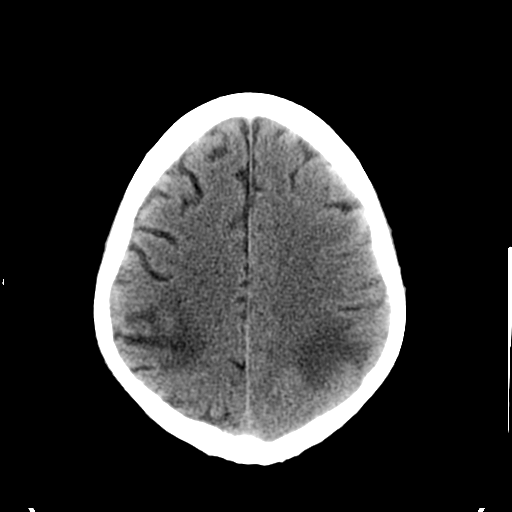
[im 23/30  brain]
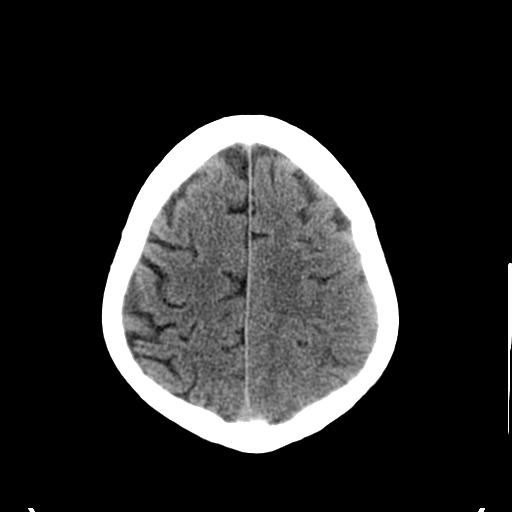
[im 25/30  brain]
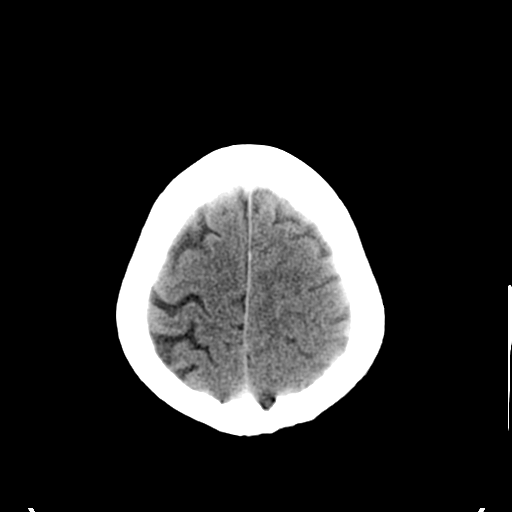
[im 25/30  bone]
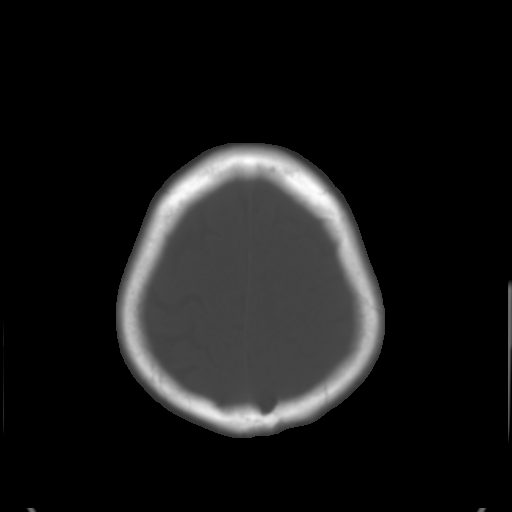
[im 27/30  brain]
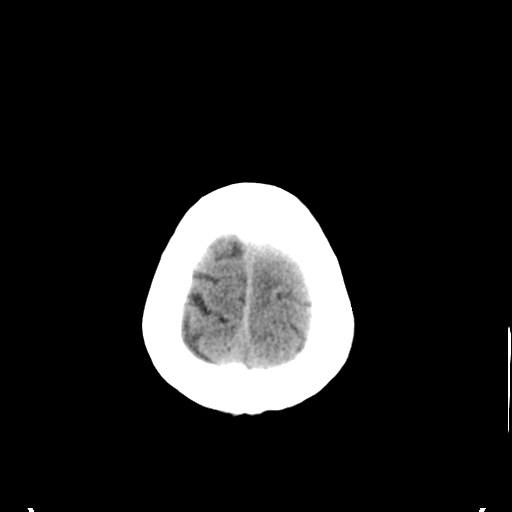
[im 29/30  brain]
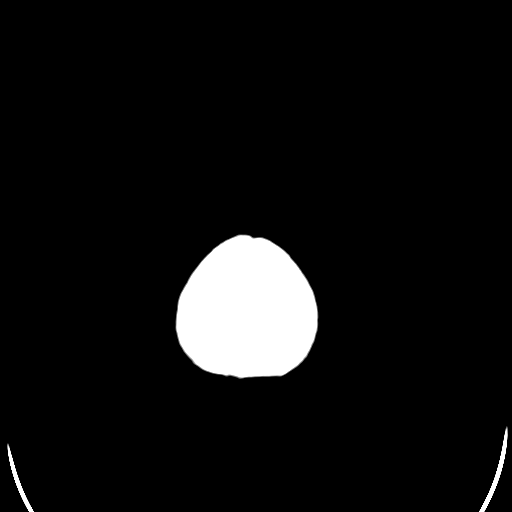

[15 of 30 positions shown; findings below may reference images not displayed]

FINDINGS: Stable paranasal sinuses and osseous structures.
Visualized orbits and scalp soft tissues are within normal limits.

Stable CT appearance of the left hemisphere from 1 day earlier
demonstrating hyperdensity confined to the gray-white matter
junction and extending somewhat into the gyri with associated
vasogenic edema tracking into the posterior limb of the left white
matter capsules.  Partial effacement of the right lateral ventricle
and rightward midline shift of 4 mm are stable.  No extra-axial or
intraventricular hemorrhage.  Chronic encephalomalacia in the right
operculum is stable.  Basilar cisterns are patent.  Gray-white
matter differentiation elsewhere is within normal limits. No
suspicious intracranial vascular hyperdensity.
IMPRESSION: 1.  Stable appearance of the left hemisphere with combination of
parenchymal hyperdensity and subjacent vasogenic edema as described
on yesterday's exam.  Stable rightward midline shift of 4 mm and
mild effacement of the left lateral ventricle.
2.  No new intracranial abnormality.
3.  Chronic right operculum encephalomalacia.

## 2011-12-25 ENCOUNTER — Encounter: Payer: Self-pay | Admitting: Internal Medicine

## 2011-12-25 ENCOUNTER — Ambulatory Visit (INDEPENDENT_AMBULATORY_CARE_PROVIDER_SITE_OTHER): Payer: Medicare Other | Admitting: Internal Medicine

## 2011-12-25 VITALS — BP 106/61 | HR 63 | Resp 18 | Ht 66.0 in | Wt 183.6 lb

## 2011-12-25 DIAGNOSIS — I472 Ventricular tachycardia: Secondary | ICD-10-CM

## 2011-12-25 DIAGNOSIS — I255 Ischemic cardiomyopathy: Secondary | ICD-10-CM

## 2011-12-25 DIAGNOSIS — Z95810 Presence of automatic (implantable) cardiac defibrillator: Secondary | ICD-10-CM

## 2011-12-25 DIAGNOSIS — I4891 Unspecified atrial fibrillation: Secondary | ICD-10-CM

## 2011-12-25 DIAGNOSIS — I2589 Other forms of chronic ischemic heart disease: Secondary | ICD-10-CM

## 2011-12-25 DIAGNOSIS — T82198A Other mechanical complication of other cardiac electronic device, initial encounter: Secondary | ICD-10-CM

## 2011-12-25 LAB — ICD DEVICE OBSERVATION
BRDY-0002RV: 40 {beats}/min
CHARGE TIME: 8.6 s
DEV-0020ICD: NEGATIVE
DEVICE MODEL ICD: 271249
TZAT-0001FASTVT: 2
TZAT-0001SLOWVT: 2
TZAT-0002FASTVT: NEGATIVE
TZAT-0013FASTVT: 2
TZAT-0018FASTVT: NEGATIVE
TZAT-0018SLOWVT: NEGATIVE
TZST-0001FASTVT: 3
TZST-0001FASTVT: 4
TZST-0001FASTVT: 8
TZST-0001SLOWVT: 7
TZST-0002SLOWVT: NEGATIVE
TZST-0002SLOWVT: NEGATIVE
TZST-0002SLOWVT: NEGATIVE
TZST-0003FASTVT: 41 J
TZST-0003FASTVT: 41 J
TZST-0003FASTVT: 41 J

## 2011-12-25 NOTE — Patient Instructions (Signed)
Your physician wants you to follow-up in: 1 year with Dr. Klein. You will receive a reminder letter in the mail two months in advance. If you don't receive a letter, please call our office to schedule the follow-up appointment.  Your physician recommends that you continue on your current medications as directed. Please refer to the Current Medication list given to you today.  

## 2011-12-25 NOTE — Assessment & Plan Note (Signed)
Stable on current medications 

## 2011-12-25 NOTE — Assessment & Plan Note (Signed)
The patient's device was interrogated.  The information was reviewed. No changes were made in the programming.    

## 2011-12-25 NOTE — Progress Notes (Signed)
  HPI  Tristan Myers is a 71 y.o. male Seen in followup for ischemic cardiomyopathy with previously implanted ICD and history of ventricular tachycardia. He is status post recent generator replacement in July 2011  This was accomplished during the hospitalization associated with a probable embolic stroke. Coumadin was initiated. He failed to followup and he subsequenttly had a hemorrhagic stroke. He is no longer on Coumadin. When last seen 2012 we  noted significant increase the pacing threshold of his LV lead. This is modestly improved   The patient denies chest pain,  nocturnal dyspnea, orthopnea or peripheral edema.  There have been no palpitations, lightheadedness or syncope. 1 he has chronic pursed lip breathing.  He says blood work was recently checked by Dr. Donnie Aho      Past Medical History  Diagnosis Date  . CAD (coronary artery disease)     s/p ICD implant in 1997  . S/P CABG x 1   . Ventricular tachycardia     Hx of it  . 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 Diabetes mellitus, type 2   . PVD (peripheral vascular disease)   . Glaucoma   . 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  Current Outpatient Prescriptions  Medication Sig Dispense Refill  . acetaminophen (TYLENOL) 325 MG tablet Take 650 mg by mouth every 4 (four) hours as needed.        Marland Kitchen aspirin 325 MG tablet Take 325 mg by mouth daily.        Marland Kitchen atenolol (TENORMIN) 50 MG tablet Take 25 mg by mouth daily.       Marland Kitchen atorvastatin (LIPITOR) 20 MG tablet Take 20 mg by mouth daily.       . benazepril (LOTENSIN) 10 MG tablet Take 5 mg by mouth daily.       . citalopram (CELEXA) 40 MG tablet Take 10 mg by mouth daily.       Marland Kitchen gemfibrozil (LOPID) 600 MG tablet Take 600 mg by mouth 2 (two) times daily.        . insulin glargine (LANTUS) 100 UNIT/ML injection Inject 26 Units into the skin at bedtime. UAD      . metFORMIN  (GLUMETZA) 1000 MG (MOD) 24 hr tablet Take 500 mg by mouth 2 (two) times daily.       . sotalol (BETAPACE) 120 MG tablet Take 120 mg by mouth 2 (two) times daily.          Allergies  Allergen Reactions  . Betadine (Povidone Iodine)     Review of Systems negative except from HPI and PMH  Physical Exam BP 106/61  Pulse 63  Resp 18  Ht 5\' 6"  (1.676 m)  Wt 183 lb 9.6 oz (83.28 kg)  BMI 29.63 kg/m2  SpO2 93% Well developed and well nourished in no acute distress with purse lipped breathing HENT normal E scleral and icterus clear Neck Supple JVP flat; carotids brisk and full Clear to ausculation Regular rate and rhythm, no murmurs gallops or rub Soft with active bowel sounds No clubbing cyanosis none Edema Alert and oriented, grossly normal motor and sensory function Skin Warm and Dry   Electrocardiogram dated today demonstrates sinus rhythm with right bundle branch block left axis deviation poor R-wave progression   Assessment and  Plan

## 2011-12-25 NOTE — Assessment & Plan Note (Signed)
No intercurrent ventricular tachycardia. I anticipate the potassium and magnesium levels are being followed by Dr. Donnie Aho and are normal

## 2011-12-25 NOTE — Assessment & Plan Note (Signed)
Pacing threshold has diminished somewhat. This appears to be stable no

## 2012-10-13 ENCOUNTER — Telehealth: Payer: Self-pay | Admitting: *Deleted

## 2012-10-13 NOTE — Telephone Encounter (Signed)
Received alert from [facility] that patient had received shock therapy on Saturday for a ventricular arrhythmia.  Review of transmission demonstrates V rate in VT zone with morphology slightly different than that of SR.  Pt was treated with ATP without success then rhythm was terminated with 41J shock.  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  Will have Dr Graciela Husbands review for any further recommendations.  Pt saw Dr Donnie Aho last week and has appt with Dr Graciela Husbands on 12-24-12.  Pt denies chest pain, shortness of breath, syncope, or tachy palpitations.

## 2012-12-23 ENCOUNTER — Encounter: Payer: Self-pay | Admitting: *Deleted

## 2012-12-24 ENCOUNTER — Encounter: Payer: Self-pay | Admitting: Internal Medicine

## 2012-12-24 ENCOUNTER — Ambulatory Visit (INDEPENDENT_AMBULATORY_CARE_PROVIDER_SITE_OTHER): Payer: Medicare Other | Admitting: Internal Medicine

## 2012-12-24 VITALS — BP 140/59 | HR 64 | Ht 67.0 in | Wt 189.0 lb

## 2012-12-24 DIAGNOSIS — I4891 Unspecified atrial fibrillation: Secondary | ICD-10-CM

## 2012-12-24 DIAGNOSIS — I2510 Atherosclerotic heart disease of native coronary artery without angina pectoris: Secondary | ICD-10-CM

## 2012-12-24 DIAGNOSIS — T82198A Other mechanical complication of other cardiac electronic device, initial encounter: Secondary | ICD-10-CM

## 2012-12-24 DIAGNOSIS — I472 Ventricular tachycardia, unspecified: Secondary | ICD-10-CM

## 2012-12-24 DIAGNOSIS — I4892 Unspecified atrial flutter: Secondary | ICD-10-CM

## 2012-12-24 DIAGNOSIS — Z95810 Presence of automatic (implantable) cardiac defibrillator: Secondary | ICD-10-CM

## 2012-12-24 DIAGNOSIS — I2589 Other forms of chronic ischemic heart disease: Secondary | ICD-10-CM

## 2012-12-24 DIAGNOSIS — I255 Ischemic cardiomyopathy: Secondary | ICD-10-CM

## 2012-12-24 LAB — ICD DEVICE OBSERVATION
BRDY-0002RV: 40 {beats}/min
DEVICE MODEL ICD: 271249
RV LEAD AMPLITUDE: 14.1 mv
TZAT-0001FASTVT: 1
TZAT-0001SLOWVT: 1
TZAT-0002SLOWVT: NEGATIVE
TZAT-0013FASTVT: 2
TZAT-0018FASTVT: NEGATIVE
TZAT-0018SLOWVT: NEGATIVE
TZAT-0018SLOWVT: NEGATIVE
TZON-0003SLOWVT: 363.6 ms
TZST-0001FASTVT: 3
TZST-0001FASTVT: 4
TZST-0001FASTVT: 6
TZST-0001SLOWVT: 3
TZST-0001SLOWVT: 6
TZST-0001SLOWVT: 7
TZST-0002SLOWVT: NEGATIVE
TZST-0002SLOWVT: NEGATIVE
TZST-0002SLOWVT: NEGATIVE
TZST-0003FASTVT: 41 J
TZST-0003FASTVT: 41 J
VENTRICULAR PACING ICD: 1 pct

## 2012-12-24 NOTE — Patient Instructions (Addendum)
Your physician wants you to follow-up in: 1 year with Dr Klein.  You will receive a reminder letter in the mail two months in advance. If you don't receive a letter, please call our office to schedule the follow-up appointment.  

## 2012-12-24 NOTE — Assessment & Plan Note (Signed)
The patient had an episode of atrial tachycardia/flutter 4/14 with an atrial rate of approximately 230 beats per minute. This was associated with antegrade Wenckebach initially 4:3 followed by 5:4 and then 1:1 with subsequent detection and delivery of therapy following SRD time mouth. The device had appropriately with a therapy through rhythm ID until this point. SRD was reprogrammed to 3--5 minutes.  I left this on as persistent tachycardia is likely resulting congestive symptoms. Up titration rate control is limited by resting bradycardia

## 2012-12-24 NOTE — Progress Notes (Signed)
Patient Care Team: Kaleen Mask, MD as PCP - General   HPI  Tristan Myers is a 72 y.o. male seen in followup for ischemic cardiomyopathy with prior bypass surgery andwith previously implanted ICD and history of ventricular tachycardia. He is status post recent generator replacement in July 2011     This was accomplished during the hospitalization associated with a probable embolic stroke. Coumadin was initiated. He failed to followup and he subsequenttly had a hemorrhagic stroke.     The patient denies chest pain, nocturnal dyspnea, orthopnea or peripheral edema. There have been no palpitations, lightheadedness or syncope. 1 he has chronic pursed lip breathing.  He had an episode of ICD discharge in April. He was active at the time. (See below)     Past Medical History  Diagnosis Date  . CAD (coronary artery disease)     s/p ICD implant in 1997  . S/P CABG x 1   . Ventricular tachycardia     Hx of it  . 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 Diabetes mellitus, type 2   . PVD (peripheral vascular disease)   . Glaucoma   . 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  Current Outpatient Prescriptions  Medication Sig Dispense Refill  . acetaminophen (TYLENOL) 325 MG tablet Take 650 mg by mouth every 4 (four) hours as needed.        Marland Kitchen aspirin 325 MG tablet Take 325 mg by mouth daily.        Marland Kitchen atenolol (TENORMIN) 50 MG tablet Take 25 mg by mouth daily.       Marland Kitchen atorvastatin (LIPITOR) 20 MG tablet Take 20 mg by mouth daily.       . benazepril (LOTENSIN) 10 MG tablet Take 5 mg by mouth daily.       . citalopram (CELEXA) 40 MG tablet Take 10 mg by mouth daily.       Marland Kitchen gemfibrozil (LOPID) 600 MG tablet Take 600 mg by mouth 2 (two) times daily.        . insulin glargine (LANTUS) 100 UNIT/ML injection Inject 26 Units into the skin at bedtime. UAD      . metFORMIN (GLUMETZA) 1000 MG  (MOD) 24 hr tablet Take 500 mg by mouth 2 (two) times daily.       . sotalol (BETAPACE) 120 MG tablet Take 120 mg by mouth 2 (two) times daily.         No current facility-administered medications for this visit.    Allergies  Allergen Reactions  . Betadine (Povidone Iodine)     Review of Systems negative except from HPI and PMH  Physical Exam BP 140/59  Pulse 64  Ht 5\' 7"  (1.702 m)  Wt 189 lb (85.73 kg)  BMI 29.59 kg/m2 Well developed and nourished in no acute distress HENT normal Neck supple with JVP-flat Clear Device pocket well healed; without hematoma or erythema  Regular rate and rhythm, no murmurs or gallops Abd-soft with active BS No Clubbing cyanosis edema Skin-warm and dry A & Oriented  Grossly normal sensory and motor function  ECG demonstrates sinus rhythm at 64 intervals 19/16/48 Right bundle branch block with left axis deviation Inferior wall infarct with posterior extension Inferolateral infarct  Assessment and  Plan

## 2012-12-24 NOTE — Assessment & Plan Note (Signed)
No intercurrent Ventricular tachycardia  

## 2012-12-24 NOTE — Assessment & Plan Note (Signed)
As above.

## 2012-12-24 NOTE — Assessment & Plan Note (Signed)
Stable but improved elevation of ventricular pacing threshold

## 2012-12-24 NOTE — Assessment & Plan Note (Signed)
The patient's device was interrogated and the information was fully reviewed.  The device was reprogrammed to Extent SRD

## 2014-08-10 ENCOUNTER — Telehealth: Payer: Self-pay | Admitting: Internal Medicine

## 2014-08-10 NOTE — Telephone Encounter (Signed)
New message     Pt has to go to court on 09-02-14.  Want Dr Graciela HusbandsKlein to write a letter that states he has a defibulator and to explain his condition.  Not sure what he is wanting the letter to say.  Please mail letter to patient.

## 2014-08-16 NOTE — Telephone Encounter (Signed)
Follow Up  Pt called to follow up on the letter requested for court purposes. Pt simply requests a letter that documents he has a defibrillator. No other information given per pt. Please assist.

## 2014-08-16 NOTE — Telephone Encounter (Signed)
Patient was worried about device going off while in court house.  Informed patient to make sure and show his card before going through metal detector. He stated, "that's what I needed to know, this makes me feel better. I was nervous till you called and explained." Patient thankful for helping answer questions/concerns.

## 2018-04-18 ENCOUNTER — Other Ambulatory Visit: Payer: Self-pay | Admitting: Cardiology

## 2018-04-18 MED ORDER — SOTALOL HCL 80 MG PO TABS: 120.0000 mg | ORAL_TABLET | Freq: Two times a day (BID) | ORAL | 1 refills | Status: DC

## 2018-04-18 MED ORDER — METOPROLOL SUCCINATE ER 25 MG PO TB24: 25.0000 mg | ORAL_TABLET | Freq: Every day | ORAL | 1 refills | Status: DC

## 2018-04-18 MED ORDER — ATORVASTATIN CALCIUM 40 MG PO TABS: 40.0000 mg | ORAL_TABLET | Freq: Every day | ORAL | 1 refills | Status: DC

## 2018-04-18 NOTE — Telephone Encounter (Signed)
° ° °  1. Which medications need to be refilled? (please list name of each medication and dose if known) sotalol 80mg  1-1/2 tabs bid; metoprololsuccinate ER 25mg  1QD; atorvastatin 40mg  1QD  2. Which pharmacy/location (including street and city if local pharmacy) is medication to be sent to? [facility]  3. Do they need a 30 day or 90 day supply? 90

## 2018-04-18 NOTE — Telephone Encounter (Signed)
Refills sent to [facility] as requested. Patient is not due for follow up until May 2020.

## 2018-04-23 ENCOUNTER — Other Ambulatory Visit: Payer: Self-pay | Admitting: Cardiology

## 2018-04-23 MED ORDER — METOPROLOL SUCCINATE ER 25 MG PO TB24: 25.0000 mg | ORAL_TABLET | Freq: Every day | ORAL | 0 refills | Status: DC

## 2018-04-23 MED ORDER — SOTALOL HCL 80 MG PO TABS: 120.0000 mg | ORAL_TABLET | Freq: Two times a day (BID) | ORAL | 0 refills | Status: DC

## 2018-04-23 MED ORDER — ATORVASTATIN CALCIUM 40 MG PO TABS: 40.0000 mg | ORAL_TABLET | Freq: Every day | ORAL | 0 refills | Status: DC

## 2018-04-23 NOTE — Telephone Encounter (Signed)
I have records for Dr. Bing Matter to review.

## 2018-04-23 NOTE — Telephone Encounter (Signed)
Tristan Myers,  Can you help with this? This patient was last seen in [place] with Dr. Graciela Husbands in 2014. The phone note was taken under Dr. York Spaniel name.  How are we handling those patient's?

## 2018-04-23 NOTE — Telephone Encounter (Signed)
Spoke with [employer] as this is a Dr. Donnie Aho patient [place] is to handle.  I have tried calling Dr. York Spaniel office but got the voicemail.

## 2018-04-23 NOTE — Telephone Encounter (Signed)
Gunnar Fusi I am unable to get into the system still, will you please either have Hayley review these records or send them my way please :)

## 2018-04-23 NOTE — Telephone Encounter (Signed)
° ° ° °  1. Which medications need to be refilled? (please list name of each medication and dose if known) atorvastatin tab, sotalol hcl tab; metoprolol succ er tab  2. Which pharmacy/location (including street and city if local pharmacy) is medication to be sent to?[facility]  3. Do they need a 30 day or 90 day supply? 90

## 2018-04-23 NOTE — Telephone Encounter (Signed)
Tristan Myers patient

## 2018-04-23 NOTE — Telephone Encounter (Signed)
Atorvastatin 40 mg daily, metoprolol succinate 25 mg tablet daily, and sotalol 80 mg tablet 1 1/2 tablet daily refilled per Dr. Bing Matter for 90 days

## 2018-04-28 ENCOUNTER — Telehealth: Payer: Self-pay | Admitting: Emergency Medicine

## 2018-04-28 NOTE — Telephone Encounter (Signed)
Left message for patient to return call.

## 2018-04-30 ENCOUNTER — Telehealth: Payer: Self-pay | Admitting: *Deleted

## 2018-04-30 NOTE — Telephone Encounter (Signed)
Confirmed with pharmacy that this has been approved by Dr. Bing MatterKrasowski.

## 2018-04-30 NOTE — Telephone Encounter (Signed)
Pharmacy now wants to know if pt can have a 90 day supply. Please advise

## 2018-04-30 NOTE — Telephone Encounter (Signed)
Confirmed with pharmacy sotalol 80 mg 1 and a half tablet twice daily is to be refilled for 90 day supply per Dr. Bing MatterKrasowski.

## 2018-04-30 NOTE — Telephone Encounter (Signed)
[facility] called to verify if Pt should be on both Metoprolol 25 mg and Sotalol 80 mg. Call back number is [phone] Ref. #[id number]. Please advise

## 2018-06-24 ENCOUNTER — Other Ambulatory Visit: Payer: Self-pay | Admitting: Cardiology

## 2018-07-17 ENCOUNTER — Other Ambulatory Visit: Payer: Self-pay | Admitting: Cardiology

## 2018-08-04 ENCOUNTER — Encounter: Payer: Self-pay | Admitting: Internal Medicine

## 2018-08-05 ENCOUNTER — Encounter: Payer: Self-pay | Admitting: Internal Medicine

## 2018-08-05 ENCOUNTER — Ambulatory Visit: Payer: Medicare Other | Admitting: Internal Medicine

## 2018-08-05 ENCOUNTER — Encounter (INDEPENDENT_AMBULATORY_CARE_PROVIDER_SITE_OTHER): Payer: Self-pay

## 2018-08-05 VITALS — BP 118/70 | HR 64 | Ht 67.0 in | Wt 166.0 lb

## 2018-08-05 DIAGNOSIS — I255 Ischemic cardiomyopathy: Secondary | ICD-10-CM | POA: Diagnosis not present

## 2018-08-05 MED ORDER — ROSUVASTATIN CALCIUM 10 MG PO TABS: 10.0000 mg | ORAL_TABLET | ORAL | 3 refills | Status: DC

## 2018-08-05 NOTE — Progress Notes (Signed)
Patient Care Team: Kaleen Mask, MD as PCP - General   HPI  Tristan Myers is a 78 y.o. male Seen to reestablish care.  He was last seen 2014 for ischemic cardiomyopathy with prior bypass surgery 1997 and with previously implanted ICD and history of ventricular tachycardia. He is status post recent generator replacement in July 2011.  He is on adjunctive sotalol therapy   This was accomplished during the hospitalization associated with a probable embolic stroke. Coumadin was initiated. He failed to followup and he subsequenttly had a hemorrhagic stroke.  The decision had been made previously to leave him on aspirin and not to pursue anticoagulation  History of inappropriate shock related to atrial tachycardia/flutter.  Device was reprogrammed.  7/14  DATE TEST EF   7/11 Echo   25 %          He denies chest pain or shortness of breath.  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  No palpitations or syncope.  No ICD discharges  Past Medical History:  Diagnosis Date  . Atrial fibrillation and flutter (HCC)    on coumadin  . CAD (coronary artery disease)    s/p ICD implant in 1997  . CVA (cerebral vascular accident) (HCC) 12/2009   LEFT PARIETAL WITH APHASIA / APRAXIA  . 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 Diabetes mellitus, type 2 (HCC)   . DVT (deep venous thrombosis) (HCC)    OF LEFT ARM FOLLOWING ICD PLACEMENT  . Glaucoma   . Hyperlipidemia   . ICD (implantable cardioverter-defibrillator)[employer]   . 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 Obesity   . PVD (peripheral vascular disease) (HCC)   . S/P CABG x 1   . 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 Ventricular tachycardia (HCC)    Hx of it    Past Surgical History:  Procedure Laterality Date  . AORTIC VALVE REPLACEMENT  (AVR)/CORONARY ARTERY BYPASS GRAFTING (CABG)     W LIMA TO LAD, SVG TO OM1, AND SVG TO RCA 09/03/1995 DR. PNTIRWER, AICD IMPLANT MARCH 1997, AICD GENERATOR REPLACEMENT 12/2001  . CARDIAC CATHETERIZATION Left 08/1995  . CATARACT EXTRACTION     OD    Current Outpatient Medications  Medication Sig Dispense Refill  . aspirin EC 81 MG tablet Take 81 mg by mouth daily.    Marland Kitchen atenolol (TENORMIN) 50 MG tablet Take 25 mg by mouth daily.     . insulin glargine (LANTUS) 100 UNIT/ML injection Inject 48 Units into the skin at bedtime. UAD    . losartan (COZAAR) 25 MG tablet Take 1 tablet by mouth daily.    . metoprolol succinate (TOPROL-XL) 25 MG 24 hr tablet Take 1 tablet (25 mg total) by mouth daily. 90 tablet 0  . sotalol (BETAPACE) 80 MG tablet TAKE 1 AND 1/2 TABLETS BY  MOUTH TWO TIMES DAILY 270 tablet 1   No current facility-administered medications for this visit.     Allergies  Allergen Reactions  . Ace Inhibitors Cough  . Betadine [Povidone Iodine]   . Warfarin And Related Other (See Comments)    BLEEDING (NON-SPECIFIC)   Social History   Tobacco Use  . Smoking status: Former Games developer  . Smokeless tobacco: Never Used  . Tobacco comment: quit in 1997  Substance Use Topics  . Alcohol use:  No  . Drug use: Never    Family History  Problem Relation Age of Onset  . Coronary artery disease Other   . Diabetes Other   . Diabetes Mother   . Heart attack Father   . Other Sister        MALIGNANT NEOPLASM OF LIVER      Review of Systems negative except from HPI and PMH  Physical Exam BP 118/70   Pulse 64   Ht 5\' 7"  (1.702 m)   Wt 166 lb (75.3 kg)   SpO2 97%   BMI 26.00 kg/m  Well developed and nourished in no acute distress HENT normal Neck supple with JVP-flat Clear Device pocket well healed; without hematoma or erythema  Regular rate and rhythm, no murmurs or gallops Abd-soft with active BS No Clubbing cyanosis edema Skin-warm and dry A & Oriented  Grossly normal  sensory and motor function  ECG  Sinus @ 64 23/15/46 RBBB occ V pacing PVC   Assessment and  Plan  Ischemic cardiomyopathy  ICD  [employer]  The patient's device was interrogated.  The information was reviewed. No changes were made in the programming.      VT remote  Atrial tachy with inappropriate shock   CVA x2, embolic and hemorrhagic     Euvolemic continue current meds  No intercurrent Ventricular tachycardia  Has stopped his statin.  Will resume.  On sotalol, will check his BMP and magnesium.  We will repeat his LVEF as is been about 5 years; at that time was 25%.  He would be a candidate for [employer].  Lost their daughter 1 month ago-Vicki-melanoma and heart failure  We did not broach the issue as to whether he should be anticoagulated for his atrial fibrillation.  This was decided previously to forego.

## 2018-08-05 NOTE — Patient Instructions (Signed)
Medication Instructions:  Your physician has recommended you make the following change in your medication:   1. Begin Crestor, 10mg  tablet, 3 times per week  Labwork: You will have labs drawn today: CBC, BMP, Mg  Testing/Procedures: Your physician has requested that you have an echocardiogram. Echocardiography is a painless test that uses sound waves to create images of your heart. It provides your doctor with information about the size and shape of your heart and how well your heart's chambers and valves are working. This procedure takes approximately one hour. There are no restrictions for this procedure.   Follow-Up: Your physician recommends that you schedule a follow-up appointment in:   6 months with Dr Graciela Husbands  Any Other Special Instructions Will Be Listed Below (If Applicable).     If you need a refill on your cardiac medications before your next appointment, please call your pharmacy.

## 2018-08-06 LAB — CBC
HEMATOCRIT: 36.4 % — AB (ref 37.5–51.0)
Hemoglobin: 12.2 g/dL — ABNORMAL LOW (ref 13.0–17.7)
MCH: 31.4 pg (ref 26.6–33.0)
MCHC: 33.5 g/dL (ref 31.5–35.7)
MCV: 94 fL (ref 79–97)
PLATELETS: 164 10*3/uL (ref 150–450)
RBC: 3.88 x10E6/uL — AB (ref 4.14–5.80)
RDW: 12.8 % (ref 11.6–15.4)
WBC: 7.7 10*3/uL (ref 3.4–10.8)

## 2018-08-06 LAB — BASIC METABOLIC PANEL
BUN/Creatinine Ratio: 16 (ref 10–24)
BUN: 13 mg/dL (ref 8–27)
CALCIUM: 9.3 mg/dL (ref 8.6–10.2)
CHLORIDE: 100 mmol/L (ref 96–106)
CO2: 24 mmol/L (ref 20–29)
Creatinine, Ser: 0.8 mg/dL (ref 0.76–1.27)
GFR, EST AFRICAN AMERICAN: 101 mL/min/{1.73_m2} (ref >59)
GFR, EST NON AFRICAN AMERICAN: 87 mL/min/{1.73_m2} (ref >59)
Glucose: 86 mg/dL (ref 65–99)
Potassium: 4.2 mmol/L (ref 3.5–5.2)
Sodium: 137 mmol/L (ref 134–144)

## 2018-08-06 LAB — MAGNESIUM: Magnesium: 2.2 mg/dL (ref 1.6–2.3)

## 2018-08-08 LAB — CUP PACEART INCLINIC DEVICE CHECK
HighPow Impedance: 36 Ohm
HighPow Impedance: 59 Ohm
Implantable Lead Location: 753860
Implantable Lead Model: 125
Lead Channel Impedance Value: 583 Ohm
Lead Channel Pacing Threshold Pulse Width: 1.5 ms
Lead Channel Setting Pacing Amplitude: 4 V
Lead Channel Setting Sensing Sensitivity: 0.5 mV
MDC IDC LEAD IMPLANT DT: 19970327
MDC IDC LEAD SERIAL: 209616
MDC IDC MSMT LEADCHNL RV PACING THRESHOLD AMPLITUDE: 1.9 V
MDC IDC MSMT LEADCHNL RV SENSING INTR AMPL: 8.4 mV
MDC IDC PG IMPLANT DT: 20110726
MDC IDC PG SERIAL: 271249
MDC IDC SESS DTM: 20200218050000
MDC IDC SET LEADCHNL RV PACING PULSEWIDTH: 1.5 ms

## 2018-08-12 ENCOUNTER — Ambulatory Visit (HOSPITAL_COMMUNITY): Payer: Medicare Other | Attending: Cardiovascular Disease

## 2018-08-12 DIAGNOSIS — I255 Ischemic cardiomyopathy: Secondary | ICD-10-CM | POA: Diagnosis present

## 2018-08-14 ENCOUNTER — Telehealth: Payer: Self-pay

## 2018-08-14 NOTE — Telephone Encounter (Signed)
Called pt's wife. She wants to cancel her husband's appointment with Dr Bing Matter as it is too far for her to drive. I advised her I would speak with Dr Graciela Husbands to be sure he did not want Mr Fennell to be followed by general cardiology as well as him. She understands I will contact her back.   I updated pt's medication list as well. Pt is not taking Atenolol. It was removed from his list. Pt's wife was instructed to not take Atorvastatin with his new prescription for Rosuvastatin as he was taking both.   Pt's wife has verbalized understanding and had no additional questions.

## 2018-08-14 NOTE — Telephone Encounter (Signed)
-----   Message from Jefferey Pica, RN sent at 08/13/2018 11:27 AM EST ----- Tristan Myers,  I spoke with the patient today- no DPR on file for his wife, but he gave an ok for me to speak with her. She was asking about a letter from Dr. Donnie Aho that they brought in when they saw Dr. Graciela Husbands. She would like this back- not sure if you have it or have seen it. It was basically a letter stating Dr. Donnie Aho wasn't practicing any longer.  She states they signed a DPR, but it's not uploaded in his chart yet.   She was also asking about the echo from yesterday- she is aware this has been read, but is waiting for [facility] to sign off on.   If you can touch base with her if you have the letter from Dr. Donnie Aho, that would be great.  Thanks!

## 2018-08-21 ENCOUNTER — Other Ambulatory Visit: Payer: Self-pay | Admitting: Cardiology

## 2018-08-21 NOTE — Telephone Encounter (Signed)
Patient sees Dr. Graciela Husbands, refill request was re-routed to him/staff.

## 2018-08-22 NOTE — Telephone Encounter (Signed)
Per Dr Graciela Husbands, pt may see him for his needs until he decided on a general cardiology. Pt's wife is aware and had no additional questions.

## 2018-08-30 ENCOUNTER — Other Ambulatory Visit: Payer: Self-pay

## 2018-08-30 ENCOUNTER — Emergency Department (HOSPITAL_COMMUNITY)
Admission: EM | Admit: 2018-08-30 | Discharge: 2018-08-31 | Disposition: A | Discharge status: Home / Self Care | Payer: Medicare Other | Attending: Emergency Medicine | Admitting: Emergency Medicine

## 2018-08-30 DIAGNOSIS — Z87891 Personal history of nicotine dependence: Secondary | ICD-10-CM | POA: Diagnosis not present

## 2018-08-30 DIAGNOSIS — Z7982 Long term (current) use of aspirin: Secondary | ICD-10-CM | POA: Insufficient documentation

## 2018-08-30 DIAGNOSIS — Z86718 Personal history of other venous thrombosis and embolism: Secondary | ICD-10-CM | POA: Insufficient documentation

## 2018-08-30 DIAGNOSIS — Z79899 Other long term (current) drug therapy: Secondary | ICD-10-CM | POA: Diagnosis not present

## 2018-08-30 DIAGNOSIS — Z794 Long term (current) use of insulin: Secondary | ICD-10-CM | POA: Diagnosis not present

## 2018-08-30 DIAGNOSIS — Z951 Presence of aortocoronary bypass graft: Secondary | ICD-10-CM | POA: Insufficient documentation

## 2018-08-30 DIAGNOSIS — Z8673 Personal history of transient ischemic attack (TIA), and cerebral infarction without residual deficits: Secondary | ICD-10-CM | POA: Insufficient documentation

## 2018-08-30 DIAGNOSIS — I2510 Atherosclerotic heart disease of native coronary artery without angina pectoris: Secondary | ICD-10-CM | POA: Diagnosis not present

## 2018-08-30 DIAGNOSIS — Z95810 Presence of automatic (implantable) cardiac defibrillator: Secondary | ICD-10-CM | POA: Insufficient documentation

## 2018-08-30 DIAGNOSIS — E11649 Type 2 diabetes mellitus with hypoglycemia without coma: Secondary | ICD-10-CM | POA: Insufficient documentation

## 2018-08-30 DIAGNOSIS — E785 Hyperlipidemia, unspecified: Secondary | ICD-10-CM | POA: Insufficient documentation

## 2018-08-30 DIAGNOSIS — E162 Hypoglycemia, unspecified: Secondary | ICD-10-CM

## 2018-08-30 LAB — URINALYSIS, ROUTINE W REFLEX MICROSCOPIC
Bilirubin Urine: NEGATIVE
GLUCOSE, UA: NEGATIVE mg/dL
Hgb urine dipstick: NEGATIVE
Ketones, ur: NEGATIVE mg/dL
Leukocytes,Ua: NEGATIVE
Nitrite: NEGATIVE
PROTEIN: NEGATIVE mg/dL
Specific Gravity, Urine: 1.012 (ref 1.005–1.030)
pH: 7 (ref 5.0–8.0)

## 2018-08-30 LAB — CBG MONITORING, ED
GLUCOSE-CAPILLARY: 71 mg/dL (ref 70–99)
Glucose-Capillary: 131 mg/dL — ABNORMAL HIGH (ref 70–99)

## 2018-08-30 MED ORDER — BACITRACIN ZINC 500 UNIT/GM EX OINT
1.0000 "application " | TOPICAL_OINTMENT | Freq: Two times a day (BID) | CUTANEOUS | Status: DC
  Administered 2018-08-30: 1 via TOPICAL

## 2018-08-30 NOTE — ED Triage Notes (Signed)
Pt arrived via [facility] from home after wife reported pt having two episodes of hypoglycemia today. EMS reported cbg of 45, D10 given. Repeat cbg by ems was 103 @2130 . 197/92, 100% ra, hr 35-70 (defib in place). Pt is alert to baseline.

## 2018-08-30 NOTE — ED Notes (Signed)
Nurse drawing labs. 

## 2018-08-30 NOTE — ED Provider Notes (Signed)
Medical screening examination/treatment/procedure(s) were conducted as a shared visit with non-physician practitioner(s) and myself.  I personally evaluated the patient during the encounter.  Diabetes on insulin no long-acting hypoglycemic agents the presents to the emergency department today secondary to hypoglycemia.  Sounds like the patient was confused so EMS was called after his blood sugar was low per his wife's report his blood sugar with them was 45 and they are giving him dextrose which improved and back to his baseline.  Recent illnesses.  He has multiple wounds to include his right dorsum of his hand and his left shoulder and his left hand that he states is from moving and carrying wood outside.  He does not report any falls.  Is reported headache, chest pain, back pain, domino pain.  No recent fevers, cough, shortness of breath or urinary symptoms.  His lungs are clear.  His abdomen is benign.  He intermittently have a heart rate in the low 50s but is asymptomatic with that. Patient's blood sugar on arrival here was in the 130s and is now in the 70s.  Will offer p.o. food to try to keep his blood sugar up.  Will also check CBC, CMP, EKG and a urinalysis to make sure none of those are related to the hypoglycemia.  If he is able to maintain blood sugars with p.o. intake then will be discharged if not will bring in for observation.  None     Raunak Antuna, Barbara Cower, MD 08/31/18 0830

## 2018-08-30 NOTE — ED Provider Notes (Signed)
[facility] EMERGENCY DEPARTMENT Provider Note   CSN: [id number] Arrival date & time: 08/30/18  2140    History   Chief Complaint Chief Complaint  Patient presents with  . Hypoglycemia    HPI Tristan Myers is a 78 y.o. male.     Patient presents to the emergency department with a chief complaint of hypoglycemia.  Patient's wife called EMS today after patient had low blood sugar on home fingerstick.  When EMS arrived, his CBG was 45 and patient was confused.  They gave dextrose and patient was returned to his baseline.  Currently, he denies any symptoms.  Denies any fever, chills, cough, shortness of breath, nausea, vomiting, diarrhea, abdominal pain.  Denies any associated symptoms.  There are no aggravating factors.  The history is provided by the patient. No language interpreter was used.    Past Medical History:  Diagnosis Date  . Atrial fibrillation and flutter (HCC)    on coumadin  . CAD (coronary artery disease)    s/p ICD implant in 1997  . CVA (cerebral vascular accident) (HCC) 12/2009   LEFT PARIETAL WITH APHASIA / APRAXIA  . 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 Diabetes mellitus, type 2 (HCC)   . DVT (deep venous thrombosis) (HCC)    OF LEFT ARM FOLLOWING ICD PLACEMENT  . Glaucoma   . Hyperlipidemia   . ICD (implantable cardioverter-defibrillator)[employer]   . 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 Obesity   . PVD (peripheral vascular disease) (HCC)   . S/P CABG x 1   . Stroke [facility])    left brain CVA with aphasia 01/03/10  . Ventricular tachycardia (HCC)    Hx of it    Patient Active Problem List   Diagnosis Date Noted  . 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 ICD (implantable cardioverter-defibrillator)[employer]   . Atrial fibrillation and flutter (HCC)   . Ventricular  tachycardia (HCC)   . CAD (coronary artery disease)   . Ischemic cardiomyopathy 01/02/2011  . 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 VENTRICULAR TACHYCARDIA 04/18/2010    Past Surgical History:  Procedure Laterality Date  . AORTIC VALVE REPLACEMENT (AVR)/CORONARY ARTERY BYPASS GRAFTING (CABG)     W LIMA TO LAD, SVG TO OM1, AND SVG TO RCA 09/03/1995 DR. [id number], AICD IMPLANT MARCH 1997, AICD GENERATOR REPLACEMENT 12/2001  . CARDIAC CATHETERIZATION Left 08/1995  . CATARACT EXTRACTION     OD        Home Medications    Prior to Admission medications   Medication Sig Start Date End Date Taking? Authorizing Provider  aspirin EC 81 MG tablet Take 81 mg by mouth daily.    [provider]  insulin glargine (LANTUS) 100 UNIT/ML injection Inject 48 Units into the skin at bedtime. UAD    [provider]  losartan (COZAAR) 25 MG tablet Take 1 tablet by mouth daily. 07/07/18   [provider]  metoprolol succinate (TOPROL-XL) 25 MG 24 hr tablet TAKE 1 TABLET BY MOUTH  DAILY 08/22/18   Duke Salvia, MD  rosuvastatin (CRESTOR) 10 MG tablet Take 1 tablet (10 mg total) by mouth 3 (three) times a week. 08/06/18 11/04/18  Duke Salvia, MD  sotalol (BETAPACE) 80 MG tablet TAKE 1 AND 1/2 TABLETS BY  MOUTH TWO TIMES DAILY 06/27/18   Georgeanna Lea, MD  Family History Family History  Problem Relation Age of Onset  . Coronary artery disease Other   . Diabetes Other   . Diabetes Mother   . Heart attack Father   . Other Sister        MALIGNANT NEOPLASM OF LIVER    Social History Social History   Tobacco Use  . Smoking status: Former Games developer  . Smokeless tobacco: Never Used  . Tobacco comment: quit in 1997  Substance Use Topics  . Alcohol use: No  . Drug use: Never     Allergies   Ace inhibitors; Betadine [povidone iodine]; and Warfarin and related   Review of  Systems Review of Systems  All other systems reviewed and are negative.    Physical Exam Updated Vital Signs There were no vitals taken for this visit.  Physical Exam Vitals signs and nursing note reviewed.  Constitutional:      Appearance: He is well-developed.  HENT:     Head: Normocephalic and atraumatic.  Eyes:     General: No scleral icterus.       Right eye: No discharge.        Left eye: No discharge.     Conjunctiva/sclera: Conjunctivae normal.     Pupils: Pupils are equal, round, and reactive to light.  Neck:     Musculoskeletal: Normal range of motion and neck supple.     Vascular: No JVD.  Cardiovascular:     Rate and Rhythm: Normal rate and regular rhythm.     Heart sounds: Normal heart sounds. No murmur. No friction rub. No gallop.   Pulmonary:     Effort: Pulmonary effort is normal. No respiratory distress.     Breath sounds: Normal breath sounds. No wheezing or rales.  Chest:     Chest wall: No tenderness.  Abdominal:     General: There is no distension.     Palpations: Abdomen is soft. There is no mass.     Tenderness: There is no abdominal tenderness. There is no guarding or rebound.  Musculoskeletal: Normal range of motion.        General: No tenderness.  Skin:    General: Skin is warm and dry.  Neurological:     Mental Status: He is alert and oriented to person, place, and time.  Psychiatric:        Behavior: Behavior normal.        Thought Content: Thought content normal.        Judgment: Judgment normal.      ED Treatments / Results  Labs (all labs ordered are listed, but only abnormal results are displayed) Labs Reviewed  COMPREHENSIVE METABOLIC PANEL - Abnormal; Notable for the following components:      Result Value   Sodium 133 (*)    Glucose, Bld 161 (*)    Calcium 8.8 (*)    AST 51 (*)    Total Bilirubin 1.5 (*)    All other components within normal limits  CBG MONITORING, ED - Abnormal; Notable for the following components:    Glucose-Capillary 131 (*)    All other components within normal limits  CBG MONITORING, ED - Abnormal; Notable for the following components:   Glucose-Capillary 159 (*)    All other components within normal limits  URINALYSIS, ROUTINE W REFLEX MICROSCOPIC  CBC  TROPONIN I  CBG MONITORING, ED    EKG EKG Interpretation  Date/Time:  Saturday August 30 2018 23:29:08 EDT Ventricular Rate:  60 PR Interval:  QRS Duration: 174 QT Interval:  503 QTC Calculation: 503 R Axis:   -116 Text Interpretation:  Sinus rhythm Prolonged PR interval IVCD, consider atypical RBBB Abnormal T, consider ischemia, lateral leads likely repol abnormality 2/2 IVCD Confirmed by Mesner, Jason [phone] Memos/15/2020 1:50:37 AM   Radiology No results found.  Procedures Procedures (including critical care time)  Medications Ordered in ED Medications  bacitracin ointment 1 application (has no administration in time range)     Initial Impression / Assessment and Plan / ED Course  I have reviewed the triage vital signs and the nursing notes.  Pertinent labs & imaging results that were available during my care of the patient were reviewed by me and considered in my medical decision making (see chart for details).       Patient with several episodes of hypoglycemia over the last 24 hours.  He is complaining of no symptoms at this time.  He is in no acute distress.  CBG in triage is 131.  Laboratory work-up is reassuring.  Patient eating and drinking, glucose stable.  Patient seen by discussed with Dr. Clayborne Dana, who recommends EKG and troponin.  Both EKG and troponin have been reviewed and are non-concerning for acute process.  Has been observed in the ED with further complaints or problems.  We will discharged home.  Final Clinical Impressions(s) / ED Diagnoses   Final diagnoses:  Hypoglycemia    ED Discharge Orders    None       Kasper, Mudrick, PA-C 08/31/18 0503    Mesner, Barbara Cower, MD  08/31/18 0830

## 2018-08-31 LAB — COMPREHENSIVE METABOLIC PANEL
ALT: 28 U/L (ref 0–44)
AST: 51 U/L — ABNORMAL HIGH (ref 15–41)
Albumin: 4 g/dL (ref 3.5–5.0)
Alkaline Phosphatase: 54 U/L (ref 38–126)
Anion gap: 8 (ref 5–15)
BUN: 14 mg/dL (ref 8–23)
CO2: 23 mmol/L (ref 22–32)
Calcium: 8.8 mg/dL — ABNORMAL LOW (ref 8.9–10.3)
Chloride: 102 mmol/L (ref 98–111)
Creatinine, Ser: 0.8 mg/dL (ref 0.61–1.24)
GFR calc Af Amer: >60 mL/min (ref >60)
GFR calc non Af Amer: >60 mL/min (ref >60)
Glucose, Bld: 161 mg/dL — ABNORMAL HIGH (ref 70–99)
POTASSIUM: 5.1 mmol/L (ref 3.5–5.1)
Sodium: 133 mmol/L — ABNORMAL LOW (ref 135–145)
Total Bilirubin: 1.5 mg/dL — ABNORMAL HIGH (ref 0.3–1.2)
Total Protein: 7.3 g/dL (ref 6.5–8.1)

## 2018-08-31 LAB — CBC
HCT: 43 % (ref 39.0–52.0)
HEMOGLOBIN: 14.3 g/dL (ref 13.0–17.0)
MCH: 31.9 pg (ref 26.0–34.0)
MCHC: 33.3 g/dL (ref 30.0–36.0)
MCV: 96 fL (ref 80.0–100.0)
Platelets: 150 10*3/uL (ref 150–400)
RBC: 4.48 MIL/uL (ref 4.22–5.81)
RDW: 13.2 % (ref 11.5–15.5)
WBC: 8.3 10*3/uL (ref 4.0–10.5)
nRBC: 0 % (ref 0.0–0.2)

## 2018-08-31 LAB — CBG MONITORING, ED: Glucose-Capillary: 159 mg/dL — ABNORMAL HIGH (ref 70–99)

## 2018-08-31 LAB — TROPONIN I: Troponin I: <0.03 ng/mL (ref <0.03)

## 2018-08-31 NOTE — ED Notes (Signed)
Patient in bed no complaints at bedside.

## 2018-08-31 NOTE — ED Notes (Signed)
Patient ambulated to the restroom with one assist.

## 2018-08-31 NOTE — ED Notes (Signed)
Contact patient wife advised patient is up for discharge.

## 2018-08-31 NOTE — ED Notes (Signed)
Patient requested, Wife be called per patient request. No answer.

## 2018-08-31 NOTE — ED Notes (Signed)
Called Lab about results. Reports labs should result soon.

## 2018-08-31 NOTE — ED Notes (Signed)
Pt wife- [phone]  Call when pt is ready for discharge

## 2018-09-01 ENCOUNTER — Telehealth: Payer: Self-pay | Admitting: Internal Medicine

## 2018-09-01 NOTE — Telephone Encounter (Signed)
New message   Patient's wife states that she needs a list of patient's medications.

## 2018-09-01 NOTE — Telephone Encounter (Signed)
Pt's wife calling to clarify pt's medication list. She thought Dr Graciela Husbands had discontinued his losartan, but he has not dc'd this. Meds were reviewed and pt's wife repeated back meds and dosages of all medications. She had no additional needs at this time.

## 2018-10-08 ENCOUNTER — Other Ambulatory Visit: Payer: Self-pay | Admitting: Cardiology

## 2018-10-23 ENCOUNTER — Ambulatory Visit: Payer: Medicare Other | Admitting: Cardiology

## 2018-11-24 ENCOUNTER — Other Ambulatory Visit: Payer: Self-pay | Admitting: Internal Medicine

## 2018-11-24 ENCOUNTER — Other Ambulatory Visit: Payer: Self-pay | Admitting: Cardiology

## 2018-12-01 ENCOUNTER — Telehealth: Payer: Self-pay | Admitting: Cardiology

## 2018-12-01 NOTE — Telephone Encounter (Signed)
°*  STAT* If patient is at the pharmacy, call can be transferred to refill team.   1. Which medications need to be refilled? (please list name of each medication and dose if known) Atorvastatin 40mg  tablet once daily  2. Which pharmacy/location (including street and city if local pharmacy) is medication to be sent to? [id number]  3. Do they need a 30 day or 90 day supply? [place]

## 2018-12-01 NOTE — Telephone Encounter (Signed)
Patient also needs Metoprolol ER 25mg  tablets once daily 90 pills

## 2018-12-02 ENCOUNTER — Telehealth: Payer: Self-pay | Admitting: Internal Medicine

## 2018-12-02 ENCOUNTER — Other Ambulatory Visit: Payer: Self-pay | Admitting: *Deleted

## 2018-12-02 MED ORDER — ROSUVASTATIN CALCIUM 10 MG PO TABS: 10.0000 mg | ORAL_TABLET | ORAL | 3 refills | Status: DC

## 2018-12-02 MED ORDER — METOPROLOL SUCCINATE ER 25 MG PO TB24: 25.0000 mg | ORAL_TABLET | Freq: Every day | ORAL | 3 refills | Status: DC

## 2018-12-02 NOTE — Addendum Note (Signed)
Addended by: Dollene Primrose on: 12/02/2018 12:07 PM   Modules accepted: Orders

## 2018-12-02 NOTE — Telephone Encounter (Signed)
Medication was sent in for 10mg , three times per week. I re-sent the same order as it needs to remain 10mg , three times per week.

## 2018-12-02 NOTE — Telephone Encounter (Signed)
I reviewed the patient's chart and it looks like some medicatons were changed by Dr. Caryl Comes. Could you please verify if these refills are needed. Thanks

## 2018-12-02 NOTE — Telephone Encounter (Signed)
F/U Message              Patient called again about his medication being put in the mail.

## 2018-12-02 NOTE — Telephone Encounter (Signed)
Rosuvastatin refill was sent to [place].

## 2018-12-02 NOTE — Telephone Encounter (Signed)
Patient came into the office today, requesting refill for Atorvastatin 40 mg and Metoprolol Succ 25 mg.  Metoprolol was sent into [place] on 12/02/18. Per epic note from 08/05/18, pt was to begin Crestor 10 mg. Please advise. Thank you.

## 2018-12-02 NOTE — Telephone Encounter (Signed)
Refill sent into pt's [place].

## 2018-12-02 NOTE — Telephone Encounter (Signed)
[facility] pharmacy is requesting clarification on Rosuvastatin 10 mg tablet. This medication was ordered for pt to take 3 times daily. Pharmacy is stating that this medication is usually prescribed once per day. LOV 08/05/18 stated that Dr. Caryl Comes wanted pt to take 3 times a week. Please clarify and send in new Rx if needed. Thanks

## 2018-12-10 ENCOUNTER — Other Ambulatory Visit: Payer: Self-pay | Admitting: Internal Medicine

## 2018-12-10 MED ORDER — ROSUVASTATIN CALCIUM 10 MG PO TABS: 10.0000 mg | ORAL_TABLET | ORAL | 2 refills | Status: DC

## 2018-12-17 ENCOUNTER — Other Ambulatory Visit: Payer: Self-pay | Admitting: Internal Medicine

## 2018-12-17 MED ORDER — LOSARTAN POTASSIUM 25 MG PO TABS: 25.0000 mg | ORAL_TABLET | Freq: Every day | ORAL | 2 refills | Status: DC

## 2018-12-22 ENCOUNTER — Ambulatory Visit (INDEPENDENT_AMBULATORY_CARE_PROVIDER_SITE_OTHER): Payer: Medicare Other | Admitting: *Deleted

## 2018-12-22 DIAGNOSIS — I255 Ischemic cardiomyopathy: Secondary | ICD-10-CM | POA: Diagnosis not present

## 2018-12-22 LAB — CUP PACEART REMOTE DEVICE CHECK
Battery Remaining Longevity: 60 mo
Battery Remaining Percentage: 63 %
Brady Statistic RV Percent Paced: 0 %
Date Time Interrogation Session: 20200706061100
HighPow Impedance: 56 Ohm
Implantable Lead Implant Date: 19970327
Implantable Lead Location: 753860
Implantable Lead Model: 125
Implantable Lead Serial Number: 209616
Implantable Pulse Generator Implant Date: 20110726
Lead Channel Impedance Value: 627 Ohm
Lead Channel Pacing Threshold Amplitude: 1.9 V
Lead Channel Pacing Threshold Pulse Width: 1.5 ms
Lead Channel Setting Pacing Amplitude: 4 V
Lead Channel Setting Pacing Pulse Width: 1.5 ms
Lead Channel Setting Sensing Sensitivity: 0.5 mV
Pulse Gen Serial Number: 271249

## 2018-12-28 ENCOUNTER — Encounter: Payer: Self-pay | Admitting: Cardiology

## 2018-12-28 NOTE — Progress Notes (Signed)
Remote ICD transmission.   

## 2019-01-27 ENCOUNTER — Encounter: Payer: Self-pay | Admitting: Internal Medicine

## 2019-01-27 ENCOUNTER — Ambulatory Visit (INDEPENDENT_AMBULATORY_CARE_PROVIDER_SITE_OTHER): Payer: Medicare Other | Admitting: Internal Medicine

## 2019-01-27 ENCOUNTER — Other Ambulatory Visit: Payer: Self-pay

## 2019-01-27 VITALS — BP 118/62 | HR 58 | Ht 67.0 in | Wt 162.6 lb

## 2019-01-27 DIAGNOSIS — I472 Ventricular tachycardia: Secondary | ICD-10-CM | POA: Diagnosis not present

## 2019-01-27 DIAGNOSIS — Z95810 Presence of automatic (implantable) cardiac defibrillator: Secondary | ICD-10-CM

## 2019-01-27 DIAGNOSIS — I255 Ischemic cardiomyopathy: Secondary | ICD-10-CM

## 2019-01-27 DIAGNOSIS — I4892 Unspecified atrial flutter: Secondary | ICD-10-CM

## 2019-01-27 DIAGNOSIS — I4891 Unspecified atrial fibrillation: Secondary | ICD-10-CM | POA: Diagnosis not present

## 2019-01-27 DIAGNOSIS — I4729 Other ventricular tachycardia: Secondary | ICD-10-CM

## 2019-01-27 LAB — CUP PACEART INCLINIC DEVICE CHECK
Date Time Interrogation Session: 20200811040000
HighPow Impedance: 36 Ohm
HighPow Impedance: 56 Ohm
Implantable Lead Implant Date: 19970327
Implantable Lead Location: 753860
Implantable Lead Model: 125
Implantable Lead Serial Number: 209616
Implantable Pulse Generator Implant Date: 20110726
Lead Channel Impedance Value: 649 Ohm
Lead Channel Pacing Threshold Amplitude: 2 V
Lead Channel Pacing Threshold Pulse Width: 1.5 ms
Lead Channel Sensing Intrinsic Amplitude: 7.4 mV
Lead Channel Setting Pacing Amplitude: 4 V
Lead Channel Setting Pacing Pulse Width: 1.5 ms
Lead Channel Setting Sensing Sensitivity: 0.5 mV
Pulse Gen Serial Number: 271249

## 2019-01-27 NOTE — Progress Notes (Signed)
Patient Care Team: Kaleen MaskElkins, Wilson Oliver, MD as PCP - General   HPI  Tristan Myers is a 78 y.o. male Seen to reestablish care.  He was last seen  in followup for  2014 for ischemic cardiomyopathy with prior bypass surgery 1997  previously implanted ICD and ventricular tachycardia. Generator replacement 7/11.  He is on adjunctive sotalol therapy   Hx of probable embolic stroke. Coumadin was initiated. He failed to followup and he subsequenttly had a hemorrhagic stroke.  The decision had been made previously to leave him on aspirin and not to pursue anticoagulation  History of inappropriate shock related to atrial tachycardia/flutter.  Device was reprogrammed.  7/14  The patient denies chest pain, shortness of breath, nocturnal dyspnea, orthopnea or peripheral edema.  There have been no palpitations, lightheadedness or syncope.     DATE TEST EF   7/11 Echo   25 %         Date Cr K Hgb  3/20 0.8 5.1 14.3            Past Medical History:  Diagnosis Date  . Atrial fibrillation and flutter (HCC)    on coumadin  . CAD (coronary artery disease)    s/p ICD implant in 1997  . CVA (cerebral vascular accident) (HCC) 12/2009   LEFT PARIETAL WITH APHASIA / APRAXIA  . 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 Diabetes mellitus, type 2 (HCC)   . DVT (deep venous thrombosis) (HCC)    OF LEFT ARM FOLLOWING ICD PLACEMENT  . Glaucoma   . Hyperlipidemia   . ICD (implantable cardioverter-defibrillator)[employer]   . 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 Obesity   . PVD (peripheral vascular disease) (HCC)   . S/P CABG x 1   . Stroke [facility](HCC)    left brain CVA with aphasia 01/03/10  . Ventricular tachycardia (HCC)    Hx of it    Past Surgical History:  Procedure Laterality Date  . AORTIC VALVE REPLACEMENT (AVR)/CORONARY ARTERY BYPASS GRAFTING (CABG)     W LIMA TO LAD, SVG TO OM1, AND  SVG TO RCA 09/03/1995 DR. [id number], AICD IMPLANT MARCH 1997, AICD GENERATOR REPLACEMENT 12/2001  . CARDIAC CATHETERIZATION Left 08/1995  . CATARACT EXTRACTION     OD    Current Outpatient Medications  Medication Sig Dispense Refill  . aspirin EC 81 MG tablet Take 81 mg by mouth daily.    . insulin glargine (LANTUS) 100 UNIT/ML injection Inject 20-40 Units into the skin at bedtime. Sliding Scale    . losartan (COZAAR) 25 MG tablet Take 1 tablet (25 mg total) by mouth daily. 90 tablet 2  . metoprolol succinate (TOPROL-XL) 25 MG 24 hr tablet Take 1 tablet (25 mg total) by mouth daily. 90 tablet 3  . rosuvastatin (CRESTOR) 10 MG tablet Take 1 tablet (10 mg total) by mouth 3 (three) times a week. 39 tablet 2  . sotalol (BETAPACE) 80 MG tablet TAKE 1 AND 1/2 TABLETS BY  MOUTH TWO TIMES DAILY 270 tablet 1   No current facility-administered medications for this visit.     Allergies  Allergen Reactions  . Ace Inhibitors Cough  . Betadine [Povidone Iodine]   . Warfarin And Related Other (See Comments)    BLEEDING (NON-SPECIFIC)   Social History   Tobacco Use  . Smoking status: Former Games developermoker  . Smokeless tobacco: Never Used  . Tobacco comment: quit in  1997  Substance Use Topics  . Alcohol use: No  . Drug use: Never    Family History  Problem Relation Age of Onset  . Coronary artery disease Other   . Diabetes Other   . Diabetes Mother   . Heart attack Father   . Other Sister        MALIGNANT NEOPLASM OF LIVER      Review of Systems negative except from HPI and PMH  Physical Exam BP 118/62   Pulse (!) 58   Ht 5\' 7"  (1.702 m)   Wt 162 lb 9.6 oz (73.8 kg)   SpO2 97%   BMI 25.47 kg/m  Well developed and nourished in no acute distress HENT normal Neck supple with JVP-flat Clear Device pocket well healed; without hematoma or erythema  Regular rate and rhythm, no murmurs or gallops Abd-soft with active BS No Clubbing cyanosis edema Skin-warm and dry A & Oriented  Grossly  normal sensory and motor function  ECG sinus @ 62 25/16/48 Assessment and  Plan  Ischemic cardiomyopathy  ICD  [employer]  The patient's device was interrogated.  The information was reviewed. No changes were made in the programming.      VT remote  Atrial tachy with inappropriate shock   CVA x2, embolic and hemorrhagic   Euvolemic continue current meds  Without symptoms of ischemia  No intercurrent Ventricular tachycardia  Continue sotalol will check surveillance   .

## 2019-01-27 NOTE — Patient Instructions (Signed)
Medication Instructions:  Your physician recommends that you continue on your current medications as directed. Please refer to the Current Medication list given to you today.  Labwork: You will have labs drawn today: BMP and Mg  Testing/Procedures: None ordered.  Follow-Up: Your physician recommends that you schedule a follow-up appointment in:   6 months with Dr. Klein  Any Other Special Instructions Will Be Listed Below (If Applicable).     If you need a refill on your cardiac medications before your next appointment, please call your pharmacy.  

## 2019-01-28 LAB — BASIC METABOLIC PANEL WITH GFR
BUN/Creatinine Ratio: 19 (ref 10–24)
BUN: 15 mg/dL (ref 8–27)
CO2: 24 mmol/L (ref 20–29)
Calcium: 9.3 mg/dL (ref 8.6–10.2)
Chloride: 103 mmol/L (ref 96–106)
Creatinine, Ser: 0.79 mg/dL (ref 0.76–1.27)
GFR calc Af Amer: 101 mL/min/{1.73_m2}
GFR calc non Af Amer: 87 mL/min/{1.73_m2}
Glucose: 159 mg/dL — ABNORMAL HIGH (ref 65–99)
Potassium: 4.4 mmol/L (ref 3.5–5.2)
Sodium: 140 mmol/L (ref 134–144)

## 2019-01-28 LAB — MAGNESIUM: Magnesium: 2.2 mg/dL (ref 1.6–2.3)

## 2019-02-11 ENCOUNTER — Other Ambulatory Visit: Payer: Self-pay | Admitting: Cardiology

## 2019-03-23 ENCOUNTER — Ambulatory Visit (INDEPENDENT_AMBULATORY_CARE_PROVIDER_SITE_OTHER): Payer: Medicare Other | Admitting: *Deleted

## 2019-03-23 DIAGNOSIS — I4892 Unspecified atrial flutter: Secondary | ICD-10-CM

## 2019-03-23 DIAGNOSIS — I4891 Unspecified atrial fibrillation: Secondary | ICD-10-CM

## 2019-03-23 DIAGNOSIS — I255 Ischemic cardiomyopathy: Secondary | ICD-10-CM

## 2019-03-23 LAB — CUP PACEART REMOTE DEVICE CHECK
Battery Remaining Longevity: 54 mo
Battery Remaining Percentage: 60 %
Brady Statistic RV Percent Paced: 0 %
Date Time Interrogation Session: 20201005072900
HighPow Impedance: 56 Ohm
Implantable Lead Implant Date: 19970327
Implantable Lead Location: 753860
Implantable Lead Model: 125
Implantable Lead Serial Number: 209616
Implantable Pulse Generator Implant Date: 20110726
Lead Channel Impedance Value: 605 Ohm
Lead Channel Pacing Threshold Amplitude: 2 V
Lead Channel Pacing Threshold Pulse Width: 1.5 ms
Lead Channel Setting Pacing Amplitude: 4 V
Lead Channel Setting Pacing Pulse Width: 1.5 ms
Lead Channel Setting Sensing Sensitivity: 0.5 mV
Pulse Gen Serial Number: 271249

## 2019-03-30 NOTE — Progress Notes (Signed)
Remote ICD transmission.   

## 2019-05-26 ENCOUNTER — Encounter (HOSPITAL_COMMUNITY): Payer: Self-pay | Admitting: *Deleted

## 2019-05-26 ENCOUNTER — Inpatient Hospital Stay (HOSPITAL_BASED_OUTPATIENT_CLINIC_OR_DEPARTMENT_OTHER): Payer: Medicare Other

## 2019-05-26 ENCOUNTER — Other Ambulatory Visit: Payer: Self-pay

## 2019-05-26 ENCOUNTER — Emergency Department (HOSPITAL_COMMUNITY): Payer: Medicare Other

## 2019-05-26 ENCOUNTER — Inpatient Hospital Stay (HOSPITAL_COMMUNITY)
Admission: EM | Admit: 2019-05-26 | Discharge: 2019-06-02 | DRG: 064 | Disposition: A | Discharge status: Skilled Nursing Facility | Payer: Medicare Other | Attending: Internal Medicine | Admitting: Internal Medicine

## 2019-05-26 DIAGNOSIS — Z95810 Presence of automatic (implantable) cardiac defibrillator: Secondary | ICD-10-CM

## 2019-05-26 DIAGNOSIS — I5022 Chronic systolic (congestive) heart failure: Secondary | ICD-10-CM | POA: Diagnosis present

## 2019-05-26 DIAGNOSIS — Z8249 Family history of ischemic heart disease and other diseases of the circulatory system: Secondary | ICD-10-CM

## 2019-05-26 DIAGNOSIS — I619 Nontraumatic intracerebral hemorrhage, unspecified: Secondary | ICD-10-CM | POA: Diagnosis present

## 2019-05-26 DIAGNOSIS — R4182 Altered mental status, unspecified: Secondary | ICD-10-CM

## 2019-05-26 DIAGNOSIS — R471 Dysarthria and anarthria: Secondary | ICD-10-CM | POA: Diagnosis present

## 2019-05-26 DIAGNOSIS — Z7982 Long term (current) use of aspirin: Secondary | ICD-10-CM

## 2019-05-26 DIAGNOSIS — R29711 NIHSS score 11: Secondary | ICD-10-CM | POA: Diagnosis present

## 2019-05-26 DIAGNOSIS — I6340 Cerebral infarction due to embolism of unspecified cerebral artery: Principal | ICD-10-CM | POA: Diagnosis present

## 2019-05-26 DIAGNOSIS — R1310 Dysphagia, unspecified: Secondary | ICD-10-CM | POA: Diagnosis present

## 2019-05-26 DIAGNOSIS — E1165 Type 2 diabetes mellitus with hyperglycemia: Secondary | ICD-10-CM | POA: Diagnosis present

## 2019-05-26 DIAGNOSIS — I639 Cerebral infarction, unspecified: Secondary | ICD-10-CM

## 2019-05-26 DIAGNOSIS — I4892 Unspecified atrial flutter: Secondary | ICD-10-CM | POA: Diagnosis present

## 2019-05-26 DIAGNOSIS — R233 Spontaneous ecchymoses: Secondary | ICD-10-CM | POA: Diagnosis present

## 2019-05-26 DIAGNOSIS — Z79899 Other long term (current) drug therapy: Secondary | ICD-10-CM

## 2019-05-26 DIAGNOSIS — Z951 Presence of aortocoronary bypass graft: Secondary | ICD-10-CM

## 2019-05-26 DIAGNOSIS — G9341 Metabolic encephalopathy: Secondary | ICD-10-CM | POA: Diagnosis present

## 2019-05-26 DIAGNOSIS — I6930 Unspecified sequelae of cerebral infarction: Secondary | ICD-10-CM

## 2019-05-26 DIAGNOSIS — I4891 Unspecified atrial fibrillation: Secondary | ICD-10-CM | POA: Diagnosis present

## 2019-05-26 DIAGNOSIS — E1151 Type 2 diabetes mellitus with diabetic peripheral angiopathy without gangrene: Secondary | ICD-10-CM | POA: Diagnosis present

## 2019-05-26 DIAGNOSIS — I255 Ischemic cardiomyopathy: Secondary | ICD-10-CM | POA: Diagnosis present

## 2019-05-26 DIAGNOSIS — F329 Major depressive disorder, single episode, unspecified: Secondary | ICD-10-CM | POA: Diagnosis present

## 2019-05-26 DIAGNOSIS — Z833 Family history of diabetes mellitus: Secondary | ICD-10-CM

## 2019-05-26 DIAGNOSIS — Z8673 Personal history of transient ischemic attack (TIA), and cerebral infarction without residual deficits: Secondary | ICD-10-CM

## 2019-05-26 DIAGNOSIS — I69320 Aphasia following cerebral infarction: Secondary | ICD-10-CM

## 2019-05-26 DIAGNOSIS — I63441 Cerebral infarction due to embolism of right cerebellar artery: Secondary | ICD-10-CM | POA: Diagnosis not present

## 2019-05-26 DIAGNOSIS — N179 Acute kidney failure, unspecified: Secondary | ICD-10-CM | POA: Diagnosis present

## 2019-05-26 DIAGNOSIS — J449 Chronic obstructive pulmonary disease, unspecified: Secondary | ICD-10-CM | POA: Diagnosis present

## 2019-05-26 DIAGNOSIS — I2510 Atherosclerotic heart disease of native coronary artery without angina pectoris: Secondary | ICD-10-CM | POA: Diagnosis present

## 2019-05-26 DIAGNOSIS — E669 Obesity, unspecified: Secondary | ICD-10-CM | POA: Diagnosis present

## 2019-05-26 DIAGNOSIS — Z781 Physical restraint status: Secondary | ICD-10-CM

## 2019-05-26 DIAGNOSIS — R4702 Dysphasia: Secondary | ICD-10-CM | POA: Diagnosis present

## 2019-05-26 DIAGNOSIS — F0390 Unspecified dementia without behavioral disturbance: Secondary | ICD-10-CM | POA: Diagnosis present

## 2019-05-26 DIAGNOSIS — I110 Hypertensive heart disease with heart failure: Secondary | ICD-10-CM | POA: Diagnosis present

## 2019-05-26 DIAGNOSIS — Z66 Do not resuscitate: Secondary | ICD-10-CM | POA: Diagnosis present

## 2019-05-26 DIAGNOSIS — Z800 Family history of malignant neoplasm of digestive organs: Secondary | ICD-10-CM

## 2019-05-26 DIAGNOSIS — I63449 Cerebral infarction due to embolism of unspecified cerebellar artery: Secondary | ICD-10-CM

## 2019-05-26 DIAGNOSIS — R29810 Facial weakness: Secondary | ICD-10-CM | POA: Diagnosis present

## 2019-05-26 DIAGNOSIS — Z952 Presence of prosthetic heart valve: Secondary | ICD-10-CM

## 2019-05-26 DIAGNOSIS — I472 Ventricular tachycardia: Secondary | ICD-10-CM | POA: Diagnosis present

## 2019-05-26 DIAGNOSIS — L8990 Pressure ulcer of unspecified site, unspecified stage: Secondary | ICD-10-CM | POA: Insufficient documentation

## 2019-05-26 DIAGNOSIS — Z9119 Patient's noncompliance with other medical treatment and regimen: Secondary | ICD-10-CM

## 2019-05-26 DIAGNOSIS — L89312 Pressure ulcer of right buttock, stage 2: Secondary | ICD-10-CM | POA: Diagnosis present

## 2019-05-26 DIAGNOSIS — Z87891 Personal history of nicotine dependence: Secondary | ICD-10-CM

## 2019-05-26 DIAGNOSIS — E785 Hyperlipidemia, unspecified: Secondary | ICD-10-CM | POA: Diagnosis present

## 2019-05-26 DIAGNOSIS — Z20828 Contact with and (suspected) exposure to other viral communicable diseases: Secondary | ICD-10-CM | POA: Diagnosis present

## 2019-05-26 DIAGNOSIS — Z86718 Personal history of other venous thrombosis and embolism: Secondary | ICD-10-CM

## 2019-05-26 LAB — URINALYSIS, ROUTINE W REFLEX MICROSCOPIC
Bilirubin Urine: NEGATIVE
Glucose, UA: 50 mg/dL — AB
Hgb urine dipstick: NEGATIVE
Ketones, ur: 20 mg/dL — AB
Leukocytes,Ua: NEGATIVE
Nitrite: NEGATIVE
Protein, ur: 30 mg/dL — AB
Specific Gravity, Urine: 1.018 (ref 1.005–1.030)
pH: 8 (ref 5.0–8.0)

## 2019-05-26 LAB — CBG MONITORING, ED
Glucose-Capillary: 203 mg/dL — ABNORMAL HIGH (ref 70–99)
Glucose-Capillary: 176 mg/dL — ABNORMAL HIGH (ref 70–99)
Glucose-Capillary: 175 mg/dL — ABNORMAL HIGH (ref 70–99)

## 2019-05-26 LAB — COMPREHENSIVE METABOLIC PANEL
ALT: 31 U/L (ref 0–44)
AST: 24 U/L (ref 15–41)
Albumin: 3.8 g/dL (ref 3.5–5.0)
Alkaline Phosphatase: 58 U/L (ref 38–126)
Anion gap: 13 (ref 5–15)
BUN: 14 mg/dL (ref 8–23)
CO2: 23 mmol/L (ref 22–32)
Calcium: 9.1 mg/dL (ref 8.9–10.3)
Chloride: 101 mmol/L (ref 98–111)
Creatinine, Ser: 0.74 mg/dL (ref 0.61–1.24)
GFR calc Af Amer: >60 mL/min (ref >60)
GFR calc non Af Amer: >60 mL/min (ref >60)
Glucose, Bld: 188 mg/dL — ABNORMAL HIGH (ref 70–99)
Potassium: 4.6 mmol/L (ref 3.5–5.1)
Sodium: 137 mmol/L (ref 135–145)
Total Bilirubin: 1.4 mg/dL — ABNORMAL HIGH (ref 0.3–1.2)
Total Protein: 7.4 g/dL (ref 6.5–8.1)

## 2019-05-26 LAB — DIFFERENTIAL
Abs Immature Granulocytes: 0.05 10*3/uL (ref 0.00–0.07)
Basophils Absolute: 0 10*3/uL (ref 0.0–0.1)
Basophils Relative: 0 %
Eosinophils Absolute: 0 10*3/uL (ref 0.0–0.5)
Eosinophils Relative: 0 %
Immature Granulocytes: 0 %
Lymphocytes Relative: 17 %
Lymphs Abs: 1.9 10*3/uL (ref 0.7–4.0)
Monocytes Absolute: 0.9 10*3/uL (ref 0.1–1.0)
Monocytes Relative: 8 %
Neutro Abs: 8.4 10*3/uL — ABNORMAL HIGH (ref 1.7–7.7)
Neutrophils Relative %: 75 %

## 2019-05-26 LAB — CBC
HCT: 41.3 % (ref 39.0–52.0)
Hemoglobin: 13.9 g/dL (ref 13.0–17.0)
MCH: 32.3 pg (ref 26.0–34.0)
MCHC: 33.7 g/dL (ref 30.0–36.0)
MCV: 96 fL (ref 80.0–100.0)
Platelets: 168 10*3/uL (ref 150–400)
RBC: 4.3 MIL/uL (ref 4.22–5.81)
RDW: 13 % (ref 11.5–15.5)
WBC: 11.3 10*3/uL — ABNORMAL HIGH (ref 4.0–10.5)
nRBC: 0 % (ref 0.0–0.2)

## 2019-05-26 LAB — I-STAT CHEM 8, ED
BUN: 15 mg/dL (ref 8–23)
Calcium, Ion: 1.08 mmol/L — ABNORMAL LOW (ref 1.15–1.40)
Chloride: 103 mmol/L (ref 98–111)
Creatinine, Ser: 0.6 mg/dL — ABNORMAL LOW (ref 0.61–1.24)
Glucose, Bld: 188 mg/dL — ABNORMAL HIGH (ref 70–99)
HCT: 41 % (ref 39.0–52.0)
Hemoglobin: 13.9 g/dL (ref 13.0–17.0)
Potassium: 4.5 mmol/L (ref 3.5–5.1)
Sodium: 137 mmol/L (ref 135–145)
TCO2: 25 mmol/L (ref 22–32)

## 2019-05-26 LAB — PROTIME-INR
INR: 1 (ref 0.8–1.2)
Prothrombin Time: 13.6 seconds (ref 11.4–15.2)

## 2019-05-26 LAB — SARS CORONAVIRUS 2 (TAT 6-24 HRS): SARS Coronavirus 2: NEGATIVE

## 2019-05-26 LAB — POC SARS CORONAVIRUS 2 AG -  ED: SARS Coronavirus 2 Ag: NEGATIVE

## 2019-05-26 LAB — APTT: aPTT: 30 seconds (ref 24–36)

## 2019-05-26 MED ORDER — ROSUVASTATIN CALCIUM 5 MG PO TABS
10.0000 mg | ORAL_TABLET | ORAL | Status: DC
  Administered 2019-05-29 – 2019-06-01 (×2): 10 mg via ORAL
  Filled 2019-05-26 (×3): qty 2

## 2019-05-26 MED ORDER — SODIUM CHLORIDE 0.9 % IV SOLN
INTRAVENOUS | Status: DC
  Administered 2019-05-27 – 2019-05-29 (×3): via INTRAVENOUS

## 2019-05-26 MED ORDER — ACETAMINOPHEN 325 MG PO TABS
650.0000 mg | ORAL_TABLET | ORAL | Status: DC | PRN
  Administered 2019-05-31: 650 mg via ORAL
  Filled 2019-05-26: qty 2

## 2019-05-26 MED ORDER — ASPIRIN EC 81 MG PO TBEC: 81.0000 mg | DELAYED_RELEASE_TABLET | Freq: Every day | ORAL | Status: DC

## 2019-05-26 MED ORDER — ACETAMINOPHEN 160 MG/5ML PO SOLN: 650.0000 mg | ORAL | Status: DC | PRN

## 2019-05-26 MED ORDER — INSULIN ASPART 100 UNIT/ML ~~LOC~~ SOLN
0.0000 [IU] | Freq: Three times a day (TID) | SUBCUTANEOUS | Status: DC
  Administered 2019-05-26: 20:00:00 3 [IU] via SUBCUTANEOUS
  Administered 2019-05-27: 5 [IU] via SUBCUTANEOUS
  Administered 2019-05-27 – 2019-05-29 (×6): 3 [IU] via SUBCUTANEOUS
  Administered 2019-05-29 – 2019-05-30 (×4): 2 [IU] via SUBCUTANEOUS
  Administered 2019-05-31: 5 [IU] via SUBCUTANEOUS
  Administered 2019-05-31: 07:00:00 3 [IU] via SUBCUTANEOUS
  Administered 2019-05-31: 2 [IU] via SUBCUTANEOUS
  Administered 2019-06-01: 3 [IU] via SUBCUTANEOUS
  Administered 2019-06-01 (×2): 2 [IU] via SUBCUTANEOUS
  Administered 2019-06-02: 3 [IU] via SUBCUTANEOUS
  Administered 2019-06-02: 2 [IU] via SUBCUTANEOUS
  Administered 2019-06-02: 3 [IU] via SUBCUTANEOUS

## 2019-05-26 MED ORDER — METOPROLOL SUCCINATE ER 25 MG PO TB24
25.0000 mg | ORAL_TABLET | Freq: Every day | ORAL | Status: DC
  Administered 2019-05-26: 25 mg via ORAL
  Filled 2019-05-26: qty 1

## 2019-05-26 MED ORDER — LOSARTAN POTASSIUM 50 MG PO TABS
25.0000 mg | ORAL_TABLET | Freq: Every day | ORAL | Status: DC
  Administered 2019-05-26: 20:00:00 25 mg via ORAL
  Filled 2019-05-26: qty 1

## 2019-05-26 MED ORDER — STROKE: EARLY STAGES OF RECOVERY BOOK
Freq: Once | Status: AC
  Administered 2019-05-28: 04:00:00

## 2019-05-26 MED ORDER — ACETAMINOPHEN 650 MG RE SUPP: 650.0000 mg | RECTAL | Status: DC | PRN

## 2019-05-26 MED ORDER — INSULIN GLARGINE 100 UNIT/ML ~~LOC~~ SOLN
20.0000 [IU] | Freq: Every day | SUBCUTANEOUS | Status: DC
  Administered 2019-05-26 – 2019-06-01 (×7): 20 [IU] via SUBCUTANEOUS
  Filled 2019-05-26 (×8): qty 0.2

## 2019-05-26 MED ORDER — SENNOSIDES-DOCUSATE SODIUM 8.6-50 MG PO TABS: 1.0000 | ORAL_TABLET | Freq: Every evening | ORAL | Status: DC | PRN

## 2019-05-26 MED ORDER — SOTALOL HCL 120 MG PO TABS
120.0000 mg | ORAL_TABLET | Freq: Two times a day (BID) | ORAL | Status: DC
  Administered 2019-05-26 – 2019-06-02 (×12): 120 mg via ORAL
  Filled 2019-05-26 (×12): qty 1
  Filled 2019-05-26: qty 1.5
  Filled 2019-05-26: qty 1
  Filled 2019-05-26: qty 1.5
  Filled 2019-05-26 (×2): qty 1

## 2019-05-26 MED ORDER — SODIUM CHLORIDE 0.9% FLUSH: 3.0000 mL | Freq: Once | INTRAVENOUS | Status: DC

## 2019-05-26 NOTE — ED Notes (Signed)
Soft restraints applied to wrists. Condom catheter placed again and vitals signs obtained.

## 2019-05-26 NOTE — Consult Note (Addendum)
Referring Physician: Dr. Eulis Foster    Chief Complaint: AMS x 4 days with speech and gait changes  HPI: Tristan Myers is a 78 y.o. male with a history of atrial fibrillation/flutter, formerly on Coumadin, strokes, CAD, DM, HLD, s/p CABG x 1, PVD, ICD placement, who presented to the ED this AM for evaluation of AMS x 4 days with speech and gait changes. He was brought in from home by EMS. Home medications include antihypertensives, ASA and rosuvastatin.  Wife is not at bedside and patient has moderate expressive and receptive dysphasia in conjunction with disorientation, thus history cannot be obtained from patient.  Per notes, wife states that on 05/23/2019, patient was outside cutting wood as he usually does on Saturdays.  At that time he complained of generalized weakness and fatigue after returning to the house.  30 to 40 minutes later he started having vomiting episodes with diarrhea.  She noted that he was unable to stand without assistance and drifted to the right.  She also felt that he had slurred speech more than normal.  For this reason patient was brought to the ED, CT head was obtained, revealing several old ischemic infarctions and a subacute right cerebellar ischemic infarction with petechial hemorrhagic conversion.   In the ED, WBC was mildly elevated at 11.3. LFTs normal. eGFR > 60. INR 1.0.   EKG:  Sinus rhythm with 1st degree A-V block Left axis deviation Right bundle branch block Possible Lateral infarct , age undetermined Inferior infarct , age undetermined Abnormal ECG  LSN: 4 days previously tPA Given: No: Out of time window.    Past Medical History:  Diagnosis Date  . Atrial fibrillation and flutter (HCC)    on coumadin  . CAD (coronary artery disease)    s/p ICD implant in 1997  . CVA (cerebral vascular accident) ([place]) 12/2009   LEFT PARIETAL WITH APHASIA / APRAXIA  . 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 Diabetes mellitus, type 2 ([place])   . DVT (deep  venous thrombosis) ([place])    OF LEFT ARM FOLLOWING ICD PLACEMENT  . Glaucoma   . Hyperlipidemia   . ICD (implantable cardioverter-defibrillator)[employer]   . 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 Obesity   . PVD (peripheral vascular disease) ([place])   . S/P CABG x 1   . Stroke [facility])    left brain CVA with aphasia 01/03/10  . Ventricular tachycardia (HCC)    Hx of it    Past Surgical History:  Procedure Laterality Date  . AORTIC VALVE REPLACEMENT (AVR)/CORONARY ARTERY BYPASS GRAFTING (CABG)     W LIMA TO LAD, SVG TO OM1, AND SVG TO RCA 09/03/1995 DR. [id number], AICD IMPLANT MARCH 1997, [place] REPLACEMENT 12/2001  . CARDIAC CATHETERIZATION Left 08/1995  . CATARACT EXTRACTION     OD    Family History  Problem Relation Age of Onset  . Coronary artery disease Other   . Diabetes Other   . Diabetes Mother   . Heart attack Father   . Other Sister        MALIGNANT NEOPLASM OF LIVER   Social History:  reports that he has quit smoking. He has never used smokeless tobacco. He reports that he does not drink alcohol or use drugs.  Allergies:  Allergies  Allergen Reactions  . Ace Inhibitors Cough  . Betadine [Povidone Iodine]   . Warfarin And Related Other (See Comments)  BLEEDING (NON-SPECIFIC)    Medications:  Prior to Admission:  Insulin ASA Losartan Toprol-XL Crestor Sotalol  Inpatient medications: .  stroke: mapping our early stages of recovery book   Does not apply Once  . insulin aspart  0-15 Units Subcutaneous TID WC  . insulin glargine  20 Units Subcutaneous QHS  . losartan  25 mg Oral Daily  . metoprolol succinate  25 mg Oral Daily  . [START ON 05/27/2019] rosuvastatin  10 mg Oral Q M,W,F  . sodium chloride flush  3 mL Intravenous Once  . sotalol  120 mg Oral BID       ROS: Unable to obtain secondary to patient's expressive and receptive  aphasia  Physical Examination: Blood pressure (!) 156/73, pulse 72, temperature 98.2 F (36.8 C), temperature source Oral, resp. rate 16, SpO2 100 %.  Physical Exam  Constitutional: Appears well-developed and well-nourished.  Psych: Affect appropriate to situation Eyes: No scleral injection HENT: No OP obstrucion Head: Normocephalic.  Respiratory: Effort normal, non-labored breathing GI: Soft.  No distension. There is no tenderness.  Skin: WDI  Neuro: Mental Status: Patient is awake, in good spirits however unable to follow directions secondary to receptive and expressive aphasia. Unable to give history. He answers all 5 orientation questions with the correct categories of words but all are incorrect (e.g. answered 1980 when asked the year).  Cranial Nerves: II: Difficult to ascertain however I do believe he has a right field cut. PERRL III,IV, VI: EOMI positive left ptosis.  V: Facial sensation is symmetric to temperature VII: Right facial droop-old.  Motor: Tone is normal. Bulk is normal. 5/5 strength was present in all four extremities.  Sensory: Sensation is symmetric to noxious stimuli. Decreased FT sensation to LUE and LLE. Inconsistently with left sided extinction to DSS.  Deep Tendon Reflexes: 2+ in the upper extremities, 1+ patellae, no Achilles Plantars: Downgoing bilaterally Cerebellar: Finger-nose showed past-pointing bilaterally, worse on the right, but with slower movements on the left.    Results for orders placed or performed during the hospital encounter of 05/26/19 (from the past 48 hour(s))  Protime-INR     Status: None   Collection Time: 05/26/19  8:39 AM  Result Value Ref Range   Prothrombin Time 13.6 11.4 - 15.2 seconds   INR 1.0 0.8 - 1.2    Comment: (NOTE) INR goal varies based on device and disease states. Performed at [facility], [place] [address], [place], [place] [address]   APTT     Status: None   Collection Time: 05/26/19  8:39 AM   Result Value Ref Range   aPTT 30 24 - 36 seconds    Comment: Performed at [facility] [address], [place], [place] [address]  CBC     Status: Abnormal   Collection Time: 05/26/19  8:39 AM  Result Value Ref Range   WBC 11.3 (H) 4.0 - 10.5 K/uL   RBC 4.30 4.22 - 5.81 MIL/uL   Hemoglobin 13.9 13.0 - 17.0 g/dL   HCT 41.3 39.0 - 52.0 %   MCV 96.0 80.0 - 100.0 fL   MCH 32.3 26.0 - 34.0 pg   MCHC 33.7 30.0 - 36.0 g/dL   RDW 13.0 11.5 - 15.5 %   Platelets 168 150 - 400 K/uL   nRBC 0.0 0.0 - 0.2 %    Comment: Performed at [facility], [place] [address], [place], [place] [address]  Differential     Status: Abnormal  Collection Time: 05/26/19  8:39 AM  Result Value Ref Range   Neutrophils Relative % 75 %   Neutro Abs 8.4 (H) 1.7 - 7.7 K/uL   Lymphocytes Relative 17 %   Lymphs Abs 1.9 0.7 - 4.0 K/uL   Monocytes Relative 8 %   Monocytes Absolute 0.9 0.1 - 1.0 K/uL   Eosinophils Relative 0 %   Eosinophils Absolute 0.0 0.0 - 0.5 K/uL   Basophils Relative 0 %   Basophils Absolute 0.0 0.0 - 0.1 K/uL   Immature Granulocytes 0 %   Abs Immature Granulocytes 0.05 0.00 - 0.07 K/uL    Comment: Performed at [facility] [address], [place], [place] [address]  Comprehensive metabolic panel     Status: Abnormal   Collection Time: 05/26/19  8:39 AM  Result Value Ref Range   Sodium 137 135 - 145 mmol/L   Potassium 4.6 3.5 - 5.1 mmol/L   Chloride 101 98 - 111 mmol/L   CO2 23 22 - 32 mmol/L   Glucose, Bld 188 (H) 70 - 99 mg/dL   BUN 14 8 - 23 mg/dL   Creatinine, Ser 0.74 0.61 - 1.24 mg/dL   Calcium 9.1 8.9 - 10.3 mg/dL   Total Protein 7.4 6.5 - 8.1 g/dL   Albumin 3.8 3.5 - 5.0 g/dL   AST 24 15 - 41 U/L   ALT 31 0 - 44 U/L   Alkaline Phosphatase 58 38 - 126 U/L   Total Bilirubin 1.4 (H) 0.3 - 1.2 mg/dL   GFR calc non Af Amer >60 >60 mL/min   GFR calc Af Amer >60 >60 mL/min   Anion gap 13 5 - 15    Comment: Performed at [facility] [address]  [place], [place] [address]  Urinalysis, Routine w reflex microscopic     Status: Abnormal   Collection Time: 05/26/19  8:39 AM  Result Value Ref Range   Color, Urine YELLOW YELLOW   APPearance HAZY (A) CLEAR   Specific Gravity, Urine 1.018 1.005 - 1.030   pH 8.0 5.0 - 8.0   Glucose, UA 50 (A) NEGATIVE mg/dL   Hgb urine dipstick NEGATIVE NEGATIVE   Bilirubin Urine NEGATIVE NEGATIVE   Ketones, ur 20 (A) NEGATIVE mg/dL   Protein, ur 30 (A) NEGATIVE mg/dL   Nitrite NEGATIVE NEGATIVE   Leukocytes,Ua NEGATIVE NEGATIVE   RBC / HPF 0-5 0 - 5 RBC/hpf   WBC, UA 0-5 0 - 5 WBC/hpf   Bacteria, UA RARE (A) NONE SEEN   Mucus PRESENT    Amorphous Crystal PRESENT     Comment: Performed at [facility] [address], [place], [place] [address]  I-stat chem 8, ED     Status: Abnormal   Collection Time: 05/26/19  8:56 AM  Result Value Ref Range   Sodium 137 135 - 145 mmol/L   Potassium 4.5 3.5 - 5.1 mmol/L   Chloride 103 98 - 111 mmol/L   BUN 15 8 - 23 mg/dL   Creatinine, Ser 0.60 (L) 0.61 - 1.24 mg/dL   Glucose, Bld 188 (H) 70 - 99 mg/dL   Calcium, Ion 1.08 (L) 1.15 - 1.40 mmol/L   TCO2 25 22 - 32 mmol/L   Hemoglobin 13.9 13.0 - 17.0 g/dL   HCT 41.0 39.0 - 52.0 %   Ct Head Wo Contrast  Result Date: 05/26/2019 CLINICAL DATA:  Multiple falls, weakness EXAM: CT HEAD WITHOUT CONTRAST TECHNIQUE: Contiguous axial images were obtained  from the base of the skull through the vertex without intravenous contrast. COMPARISON:  2011 FINDINGS: Brain: There is an area of low attenuation with superimposed increased density within the right cerebellar hemisphere extending into the vermis. No significant mass effect. There are chronic infarctions of the right frontoparietal lobes and left temporoparietal lobes with associated volume loss. Prominence of the ventricles and sulci reflects superimposed generalized parenchymal volume loss. Additional patchy hypoattenuation in the supratentorial white matter is  nonspecific but probably reflects mild chronic microvascular ischemic changes. Vascular: No hyperdense vessel or unexpected calcification. Skull: Calvarium is unremarkable. Sinuses/Orbits: Mild paranasal sinus mucosal thickening. No acute intraorbital abnormality. Other: Mastoid air cells are clear. IMPRESSION: Likely subacute right cerebellar infarction with mild reperfusion hemorrhage. No significant mass effect. Follow-up CT or MR imaging is recommended to exclude a mass. Chronic/nonemergent findings detailed above. Electronically Signed   By: Macy Mis M.D.   On: 05/26/2019 10:30    Assessment: 78 y.o. male with late subacute medium-sized right cerebellar ischemic infarction with mild petechial hemorrhagic conversion on CT. Etiology most likely cardioembolic.  1. Also with 3 chronic ischemic infarctions in order of size as follows: Left parietal, right parietotemporal, right frontal. 2. Exam reveals expressive and receptive aphasia with right facial droop and slight drift to the right when walking. Also with RUE past-pointing and left sided sensory deficits.  3. Stroke Risk Factors - atrial fibrillation/flutter, prior strokes, CAD, DM, HLD and PVD  Plan: 1. HgbA1c, fasting lipid panel 2. MRI of the brain if capable with ICD 3. PT consult, OT consult, Speech consult 4. Echocardiogram 5. CTA of head and neck 6. Would hold aspirin for 1 week to prevent further hemorrhagic conversion of the subacute right cerebellar infarction. If CT head shows stability after 7 days, consider restarting ASA as his risk of recurrent stroke is high given multiple prior strokes and atrial fibrillation/flutter. Also needs ASA for MI prevention. Benefits of holding ASA for 7 days outweigh risks.  7. Risk factor modification 8. Telemetry monitoring 9. Frequent neuro checks 10. Continue Crestor.  11. BP management. Out of permissive HTN time window.   I have seen and examined the patient. I have formulated the  assessment and recommendations. 78 year old male with atrial fibrillation/flutter, on ASA at home, presenting with medium-sized right cerebellar ischemic infarction with petechial hemorrhagic conversion. At high risk for severe neurological sequelae if hemorrhage extends given location near the brainstem and 4th ventricle. Exam with multiple deficits corresponding to his old strokes as well as the new right cerebellar stroke. Recommendations as above.  _0  signed: Dr. Kerney Elbe 05/26/2019, 1:26 PM

## 2019-05-26 NOTE — ED Notes (Signed)
Tristan Myers [phone]) wife wants an update

## 2019-05-26 NOTE — ED Notes (Signed)
Pt. Has attempted to get out of bed several times. Pt. Is confused and agitated. Was able to talk Pt. Into lying back down in bed. Pt. Is now esting and quit but uncooperative r/t commands such as replace monitor leads

## 2019-05-26 NOTE — ED Provider Notes (Signed)
[facility] EMERGENCY DEPARTMENT Provider Note   CSN: [id number] Arrival date & time: 05/26/19  4196     History   Chief Complaint Chief Complaint  Patient presents with  . Altered Mental Status    HPI Tristan Myers is a 78 y.o. male with a past medical history of CAD, prior CVA in 2013 with residual slurred speech, ICD in place, presenting to the ED with a chief complaint of altered mental status.  64 of history is provided by wife over the phone as patient's speech is somewhat difficult to understand.  Wife states that on 05/23/2019, patient was outside cutting wood as he usually does on Saturdays.  He complained of generalized weakness and fatigue after coming in the house, attempted to eat something and then all of a sudden felt dizzy.  30 to 40 minutes later he started having an episode of vomiting and diarrhea.  Wife states that since then he has been unable to stand without assistance, noted decreased appetite and worsening fatigue.  However she states that he is at his mental baseline, as well as baseline speech.  Denies any injuries or falls, complaints of chest pain or headache, vision changes.      HPI  Past Medical History:  Diagnosis Date  . Atrial fibrillation and flutter (HCC)    on coumadin  . CAD (coronary artery disease)    s/p ICD implant in 1997  . CVA (cerebral vascular accident) ([place]) 12/2009   LEFT PARIETAL WITH APHASIA / APRAXIA  . 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 Diabetes mellitus, type 2 ([place])   . DVT (deep venous thrombosis) ([place])    OF LEFT ARM FOLLOWING ICD PLACEMENT  . Glaucoma   . Hyperlipidemia   . ICD (implantable cardioverter-defibrillator)[employer]   . 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 Obesity   . PVD (peripheral vascular disease) ([place])   . S/P CABG x 1   . Stroke [facility])    left brain CVA with  aphasia 01/03/10  . Ventricular tachycardia ([place])    Hx of it    Patient Active Problem List   Diagnosis Date Noted  . 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 ICD (implantable cardioverter-defibrillator)[employer]   . Atrial fibrillation and flutter ([place])   . Ventricular tachycardia ([place])   . CAD (coronary artery disease)   . Ischemic cardiomyopathy 01/02/2011  . 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 VENTRICULAR TACHYCARDIA 04/18/2010    Past Surgical History:  Procedure Laterality Date  . AORTIC VALVE REPLACEMENT (AVR)/CORONARY ARTERY BYPASS GRAFTING (CABG)     W LIMA TO LAD, SVG TO OM1, AND SVG TO RCA 09/03/1995 DR. [id number], AICD IMPLANT MARCH 1997, [place] REPLACEMENT 12/2001  . CARDIAC CATHETERIZATION Left 08/1995  . CATARACT EXTRACTION     OD        Home Medications    Prior to Admission medications   Medication Sig Start Date End Date Taking? Authorizing Provider  aspirin EC 81 MG tablet Take 81 mg by mouth daily.    [provider]  insulin glargine (LANTUS) 100 UNIT/ML injection Inject 20-40 Units into the skin at bedtime. Sliding Scale    [provider]  losartan (COZAAR) 25 MG tablet Take 1 tablet (25 mg total) by mouth daily. 12/17/18   Deboraha Sprang, MD  metoprolol succinate (TOPROL-XL)  25 MG 24 hr tablet Take 1 tablet (25 mg total) by mouth daily. 12/02/18   Duke SalviaKlein, Steven C, MD  rosuvastatin (CRESTOR) 10 MG tablet Take 1 tablet (10 mg total) by mouth 3 (three) times a week. 12/10/18   Duke SalviaKlein, Steven C, MD  sotalol (BETAPACE) 80 MG tablet Take 1  1/2 tab 2 times daily NEED OFFICE VISIT FOR MORE REFILLS 02/12/19   Georgeanna LeaKrasowski, Treston J, MD    Family History Family History  Problem Relation Age of Onset  . Coronary artery disease Other   . Diabetes Other   . Diabetes Mother   . Heart attack Father   . Other  Sister        MALIGNANT NEOPLASM OF LIVER    Social History Social History   Tobacco Use  . Smoking status: Former Games developermoker  . Smokeless tobacco: Never Used  . Tobacco comment: quit in 1997  Substance Use Topics  . Alcohol use: No  . Drug use: Never     Allergies   Ace inhibitors, Betadine [povidone iodine], and Warfarin and related   Review of Systems Review of Systems  Unable to perform ROS: Mental status change  Constitutional: Positive for fatigue.  Musculoskeletal: Positive for gait problem.  Neurological: Positive for speech difficulty and weakness.     Physical Exam Updated Vital Signs BP (!) 156/73 (BP Location: Left Arm)   Pulse 72   Temp 98.2 F (36.8 C) (Oral)   Resp 16   SpO2 100%   Physical Exam Vitals signs and nursing note reviewed.  Constitutional:      General: He is not in acute distress.    Appearance: He is well-developed. He is not diaphoretic.  HENT:     Head: Normocephalic and atraumatic.     Nose: Nose normal.  Eyes:     General: No scleral icterus.       Right eye: No discharge.        Left eye: No discharge.     Conjunctiva/sclera: Conjunctivae normal.     Pupils: Pupils are equal, round, and reactive to light.  Neck:     Musculoskeletal: Normal range of motion and neck supple.  Cardiovascular:     Rate and Rhythm: Normal rate and regular rhythm.     Heart sounds: Normal heart sounds. No murmur. No friction rub. No gallop.   Pulmonary:     Effort: Pulmonary effort is normal. No respiratory distress.     Breath sounds: Normal breath sounds.  Abdominal:     General: Bowel sounds are normal. There is no distension.     Palpations: Abdomen is soft.     Tenderness: There is no abdominal tenderness. There is no guarding.  Musculoskeletal: Normal range of motion.  Skin:    General: Skin is warm and dry.     Findings: No rash.  Neurological:     General: No focal deficit present.     Mental Status: He is alert.     Cranial  Nerves: No cranial nerve deficit.     Sensory: No sensory deficit.     Motor: No abnormal muscle tone.     Coordination: Coordination normal.     Comments: Alert, oriented to self, place, year. Able to name wife.  Slight left-sided facial droop noted of unknown chronicity.  Symmetrical tongue protrusion.  Strength 5/5 in bilateral upper and lower extremities.  Sensation intact to light touch of bilateral upper and lower extremities.      ED Treatments /  Results  Labs (all labs ordered are listed, but only abnormal results are displayed) Labs Reviewed  CBC - Abnormal; Notable for the following components:      Result Value   WBC 11.3 (*)    All other components within normal limits  DIFFERENTIAL - Abnormal; Notable for the following components:   Neutro Abs 8.4 (*)    All other components within normal limits  COMPREHENSIVE METABOLIC PANEL - Abnormal; Notable for the following components:   Glucose, Bld 188 (*)    Total Bilirubin 1.4 (*)    All other components within normal limits  URINALYSIS, ROUTINE W REFLEX MICROSCOPIC - Abnormal; Notable for the following components:   APPearance HAZY (*)    Glucose, UA 50 (*)    Ketones, ur 20 (*)    Protein, ur 30 (*)    Bacteria, UA RARE (*)    All other components within normal limits  I-STAT CHEM 8, ED - Abnormal; Notable for the following components:   Creatinine, Ser 0.60 (*)    Glucose, Bld 188 (*)    Calcium, Ion 1.08 (*)    All other components within normal limits  PROTIME-INR  APTT  CBG MONITORING, ED  POC SARS CORONAVIRUS 2 AG -  ED    EKG None  Radiology Ct Head Wo Contrast  Result Date: 05/26/2019 CLINICAL DATA:  Multiple falls, weakness EXAM: CT HEAD WITHOUT CONTRAST TECHNIQUE: Contiguous axial images were obtained from the base of the skull through the vertex without intravenous contrast. COMPARISON:  2011 FINDINGS: Brain: There is an area of low attenuation with superimposed increased density within the right  cerebellar hemisphere extending into the vermis. No significant mass effect. There are chronic infarctions of the right frontoparietal lobes and left temporoparietal lobes with associated volume loss. Prominence of the ventricles and sulci reflects superimposed generalized parenchymal volume loss. Additional patchy hypoattenuation in the supratentorial white matter is nonspecific but probably reflects mild chronic microvascular ischemic changes. Vascular: No hyperdense vessel or unexpected calcification. Skull: Calvarium is unremarkable. Sinuses/Orbits: Mild paranasal sinus mucosal thickening. No acute intraorbital abnormality. Other: Mastoid air cells are clear. IMPRESSION: Likely subacute right cerebellar infarction with mild reperfusion hemorrhage. No significant mass effect. Follow-up CT or MR imaging is recommended to exclude a mass. Chronic/nonemergent findings detailed above. Electronically Signed   By: Guadlupe Spanish M.D.   On: 05/26/2019 10:30    Procedures Procedures (including critical care time)  Medications Ordered in ED Medications  sodium chloride flush (NS) 0.9 % injection 3 mL (has no administration in time range)     Initial Impression / Assessment and Plan / ED Course  I have reviewed the triage vital signs and the nursing notes.  Pertinent labs & imaging results that were available during my care of the patient were reviewed by me and considered in my medical decision making (see chart for details).        78 year old male with a past medical history of prior CVA presenting to the ED with a chief complaint of altered mental status.  Per wife, 4 days ago he had sudden onset of dizziness and since then unable to ambulate or stand without assistance.  She is concerned that he may have had a stroke.  On exam patient overall well-appearing.  Somewhat difficult to understand based on speech difficulty.  No deficits neurological exam noted otherwise.  There is a slight left-sided  facial droop of unknown chronicity.  CT shows likely subacute right cerebellar infarction with mild reperfusion  hemorrhage.  Lab work is unremarkable.  I have consulted Dr. Otelia Limes who will see the patient and asked that we admit to hospitalist. Appreciate help of neurologist and hospitalist for management of this patient.  Final Clinical Impressions(s) / ED Diagnoses   Final diagnoses:  Cerebrovascular accident (CVA), unspecified mechanism [facility])    ED Discharge Orders    None     Portions of this note were generated with Dragon dictation software. Dictation errors may occur despite best attempts at proofreading.    Dietrich Pates, PA-C 05/26/19 1347    Mancel Bale, MD 05/26/19 1944

## 2019-05-26 NOTE — ED Notes (Signed)
Cleaned pt up and changed linen. New condom cath placed.

## 2019-05-26 NOTE — ED Provider Notes (Signed)
  Face-to-face evaluation   History: He presents for evaluation of altered mental status.  Is a somewhat difficult historian.  Physical exam: Alert elderly man.  He is dysarthric, with aphasia of expression.  Normal strength arms and legs bilaterally.  Medical screening examination/treatment/procedure(s) were conducted as a shared visit with non-physician practitioner(s) and myself.  I personally evaluated the patient during the encounter    Daleen Bo, MD 05/26/19 1944

## 2019-05-26 NOTE — H&P (Addendum)
History and Physical    Tristan Myers:[id number] DOB: 07-04-1940 DOA: 05/26/2019  PCP: Kaleen Mask, MD (Confirm with patient/family/NH records and if not entered, this has to be entered at [facility] point of entry) Patient coming from: home  I have personally briefly reviewed patient's old medical records in [facility]  Chief Complaint: gait disorder, decreased activity  HPI: Tristan Myers is a 78 y.o. 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  Majority of history is provided by wife over the phone as patient's speech is somewhat difficult to understand.  Wife states that on 05/23/2019, patient was outside cutting wood as he usually does on Saturdays.  He complained of generalized weakness and fatigue after coming in the house, attempted to eat something and then all of a sudden felt dizzy.  30 to 40 minutes later he started having an episode of vomiting and diarrhea.  Wife states that since then he has been unable to stand without assistance, noted decreased appetite and worsening fatigue.  However she states that he is at his mental baseline, as well as baseline speech.  Denies any injuries or falls, complaints of chest pain or headache, vision changes. .  ED Course: Mildly hypertensive in ED. Patient had CT head revealing new subacute R cerebellar infarct with reperfusion hemorrhage. Routine labs were OK except for serum glucose being elevated.  Review of Systems: As per HPI otherwise 10 point review of systems negative.    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   Diabetes mellitus,  type 2 (HCC)    DVT (deep venous thrombosis) (HCC)    OF LEFT ARM FOLLOWING ICD PLACEMENT   Glaucoma    Hyperlipidemia    ICD (implantable cardioverter-defibrillator)[employer]    Inappropriate shocks from ICD (implantable cardioverter-defibrillator)    4/14 atrial tachycardia with antegrade Wenckebach   Obesity    PVD (peripheral vascular disease) (HCC)    S/P CABG x 1    Stroke (HCC)    left brain CVA with aphasia 01/03/10   Ventricular tachycardia (HCC)    Hx of it    Past Surgical History:  Procedure Laterality Date   AORTIC VALVE REPLACEMENT (AVR)/CORONARY ARTERY BYPASS GRAFTING (CABG)     W LIMA TO LAD, SVG TO OM1, AND SVG TO RCA 09/03/1995 DR. [id number], AICD IMPLANT MARCH 1997, AICD GENERATOR REPLACEMENT 12/2001   CARDIAC CATHETERIZATION Left 08/1995   CATARACT EXTRACTION     OD   Social Hx - married 54 years. Had 1 dtr - deceased. 1 grand-daughter that lives with them, one grandson.  Retired. Lives with wife. Very active.   reports that he has quit smoking. He has never used smokeless tobacco. He reports that he does not drink alcohol or use drugs.  Allergies  Allergen Reactions   Ace Inhibitors Cough   Betadine [Povidone Iodine]    Warfarin And Related Other (See Comments)    BLEEDING (NON-SPECIFIC)    Family History  Problem Relation Age of Onset   Coronary artery disease Other    Diabetes Other  Diabetes Mother    Heart attack Father    Other Sister        MALIGNANT NEOPLASM OF LIVER   Unacceptable: Noncontributory, unremarkable, or negative. Acceptable: Family history reviewed and not pertinent (If you reviewed it)  Prior to Admission medications   Medication Sig Start Date End Date Taking? Authorizing Provider  aspirin EC 81 MG tablet Take 81 mg by mouth daily.    [provider]  insulin glargine (LANTUS) 100 UNIT/ML injection Inject 20-40 Units into the skin at bedtime. Sliding Scale    [provider]    losartan (COZAAR) 25 MG tablet Take 1 tablet (25 mg total) by mouth daily. 12/17/18   Deboraha Sprang, MD  metoprolol succinate (TOPROL-XL) 25 MG 24 hr tablet Take 1 tablet (25 mg total) by mouth daily. 12/02/18   Deboraha Sprang, MD  rosuvastatin (CRESTOR) 10 MG tablet Take 1 tablet (10 mg total) by mouth 3 (three) times a week. 12/10/18   Deboraha Sprang, MD  sotalol (BETAPACE) 80 MG tablet Take 1  1/2 tab 2 times daily NEED OFFICE VISIT FOR MORE REFILLS 02/12/19   Park Liter, MD    Physical Exam: Vitals:   05/26/19 0815 05/26/19 0908 05/26/19 1142  BP: (!) 172/82 (!) 151/96 (!) 156/73  Pulse: 70 79 72  Resp: 15 14 16   Temp: 98.2 F (36.8 C) 98.2 F (36.8 C)   TempSrc: Oral Oral   SpO2: 100% 100% 100%    Constitutional: NAD, calm, comfortable Vitals:   05/26/19 0815 05/26/19 0908 05/26/19 1142  BP: (!) 172/82 (!) 151/96 (!) 156/73  Pulse: 70 79 72  Resp: 15 14 16   Temp: 98.2 F (36.8 C) 98.2 F (36.8 C)   TempSrc: Oral Oral   SpO2: 100% 100% 100%   General:  Elderly man, a little scruffy, in no distress. Awake and alert. Eyes: PERRL, lids and conjunctivae normal ENMT: Mucous membranes are moist. Posterior pharynx clear of any exudate or lesions.Fair dentition.  Neck: normal, supple, no masses, no thyromegaly Chest - scarred Left upper chest. AICD palpable Respiratory: clear to auscultation bilaterally, no wheezing, no crackles. Normal respiratory effort. No accessory muscle use.  Cardiovascular: Regular rate and rhythm, no murmurs / rubs / gallops. No extremity edema. 2+ pedal pulses. No carotid bruits.  Abdomen: no tenderness, no masses palpated. No hepatosplenomegaly. Bowel sounds positive.  Musculoskeletal: no clubbing / cyanosis. No joint deformity upper and lower extremities. Good ROM, no contractures. Normal muscle tone.  Skin: no rashes, lesions, ulcers. No induration Neurologic: CN 2-12 lid leg left eye. EOMI. PERRLA. Sensation intact, DTR normal. Strength 5/5  in all 4. Cerebellar - did not stand or ambulate Psychiatric: Normal judgment and insight. Alert and oriented x 3. Normal mood.   (Anything < 9 systems with 2 bullets each down codes to level 1) (If patient refuses exam cant bill higher level) (Make sure to document decubitus ulcers present on admission -- if possible -- and whether patient has chronic indwelling catheter at time of admission)  Labs on Admission: I have personally reviewed following labs and imaging studies  CBC: Recent Labs  Lab 05/26/19 0839 05/26/19 0856  WBC 11.3*  --   NEUTROABS 8.4*  --   HGB 13.9 13.9  HCT 41.3 41.0  MCV 96.0  --   PLT 168  --    Basic Metabolic Panel: Recent Labs  Lab 05/26/19 0839 05/26/19 0856  NA 137 137  K 4.6 4.5  CL  101 103  CO2 23  --   GLUCOSE 188* 188*  BUN 14 15  CREATININE 0.74 0.60*  CALCIUM 9.1  --    GFR: CrCl cannot be calculated (Unknown ideal weight.). Liver Function Tests: Recent Labs  Lab 05/26/19 0839  AST 24  ALT 31  ALKPHOS 58  BILITOT 1.4*  PROT 7.4  ALBUMIN 3.8   No results for input(s): LIPASE, AMYLASE in the last 168 hours. No results for input(s): AMMONIA in the last 168 hours. Coagulation Profile: Recent Labs  Lab 05/26/19 0839  INR 1.0   Cardiac Enzymes: No results for input(s): CKTOTAL, CKMB, CKMBINDEX, TROPONINI in the last 168 hours. BNP (last 3 results) No results for input(s): PROBNP in the last 8760 hours. HbA1C: No results for input(s): HGBA1C in the last 72 hours. CBG: No results for input(s): GLUCAP in the last 168 hours. Lipid Profile: No results for input(s): CHOL, HDL, LDLCALC, TRIG, CHOLHDL, LDLDIRECT in the last 72 hours. Thyroid Function Tests: No results for input(s): TSH, T4TOTAL, FREET4, T3FREE, THYROIDAB in the last 72 hours. Anemia Panel: No results for input(s): VITAMINB12, FOLATE, FERRITIN, TIBC, IRON, RETICCTPCT in the last 72 hours. Urine analysis:    Component Value Date/Time   COLORURINE YELLOW  05/26/2019 0839   APPEARANCEUR HAZY (A) 05/26/2019 0839   LABSPEC 1.018 05/26/2019 0839   PHURINE 8.0 05/26/2019 0839   GLUCOSEU 50 (A) 05/26/2019 0839   HGBUR NEGATIVE 05/26/2019 0839   BILIRUBINUR NEGATIVE 05/26/2019 0839   KETONESUR 20 (A) 05/26/2019 0839   PROTEINUR 30 (A) 05/26/2019 0839   UROBILINOGEN 0.2 01/23/2010 1231   NITRITE NEGATIVE 05/26/2019 0839   LEUKOCYTESUR NEGATIVE 05/26/2019 0839    Radiological Exams on Admission: Ct Head Wo Contrast  Result Date: 05/26/2019 CLINICAL DATA:  Multiple falls, weakness EXAM: CT HEAD WITHOUT CONTRAST TECHNIQUE: Contiguous axial images were obtained from the base of the skull through the vertex without intravenous contrast. COMPARISON:  2011 FINDINGS: Brain: There is an area of low attenuation with superimposed increased density within the right cerebellar hemisphere extending into the vermis. No significant mass effect. There are chronic infarctions of the right frontoparietal lobes and left temporoparietal lobes with associated volume loss. Prominence of the ventricles and sulci reflects superimposed generalized parenchymal volume loss. Additional patchy hypoattenuation in the supratentorial white matter is nonspecific but probably reflects mild chronic microvascular ischemic changes. Vascular: No hyperdense vessel or unexpected calcification. Skull: Calvarium is unremarkable. Sinuses/Orbits: Mild paranasal sinus mucosal thickening. No acute intraorbital abnormality. Other: Mastoid air cells are clear. IMPRESSION: Likely subacute right cerebellar infarction with mild reperfusion hemorrhage. No significant mass effect. Follow-up CT or MR imaging is recommended to exclude a mass. Chronic/nonemergent findings detailed above. Electronically Signed   By: Guadlupe SpanishPraneil  Patel M.D.   On: 05/26/2019 10:30    EKG: Independently reviewed. SR, RBBB, 1 degree AVB, old lateral and inf infarct. No acute changes  Assessment/Plan Active Problems:   CVA (cerebral  vascular accident) (HCC)   History of hemorrhagic cerebrovascular accident (CVA) with residual deficit   Ischemic cardiomyopathy   Atrial fibrillation and flutter (HCC)   CVA (cerebrovascular accident) (HCC)  (please populate well all problems here in Problem List. (For example, if patient is on BP meds at home and you resume or decide to hold them, it is a problem that needs to be her. Same for CAD, COPD, HLD and so on)   1. Neuro - Patient with new cerebellar cva with reprofusion hemorrhage. Outside window for thrombolytic therapy. Currently on  ASA. Plan obs admit  PT/OT eval for rehab needs: home with [facility] vs inpatient rehab  Neuro consult pending - recommendations will follow  2. Cardiovascular - in sinus rhythm at this time. Stable from CV perspective Plan Continue home meds.   3. Discussed code status with wife - DNR    DVT prophylaxis: scds (Lovenox/Heparin/SCD's/anticoagulated/None (if comfort care) Code Status: DNR (Full/Partial (specify details) Family Communication: spoke with wife. Answered questions. She confirmed code status (Specify name, relationship. Do not write "discussed with patient". Specify tel # if discussed over the phone) Disposition Plan: te de determined (specify when and where you expect patient to be discharged) Consults called: neuro - Dr. Wadie Lessen called by EDP (with names) Admission status: obs (inpatient / obs / tele / medical floor / SDU)   Illene Regulus MD [facility] Pager [phone]  If 7PM-7AM, please contact night-coverage [email or web address] Password [email or web address]  05/26/2019, 2:13 PM

## 2019-05-26 NOTE — Progress Notes (Signed)
Carotid duplex has been completed.   Preliminary results in CV Proc.   Abram Sander 05/26/2019 2:50 PM

## 2019-05-26 NOTE — ED Notes (Signed)
Attending Dr. Linda Hedges at the bedside. He is speaking with the wife on the phone for an update.

## 2019-05-26 NOTE — ED Notes (Signed)
The patient was found at the end of the bed, attempting to use the urinal. Telemetry monitor off at this time. We will trial a condom cath as the patient is a high fall risk.Marland Kitchen  Requesting an order for a Air cabin crew.

## 2019-05-26 NOTE — ED Triage Notes (Signed)
Pt arrived by [facility] from home for altered mental status x 4 days. Reports changes in speech and gait. Hx of stroke.

## 2019-05-26 NOTE — ED Notes (Signed)
Pt. Is resting peacefully and seems to be at ease, restarints removed.However, upon checking pt. When removing restraints it was noted that pt. Had removed condom catheter yet again. Pt.'s vitals were wdl

## 2019-05-26 NOTE — ED Notes (Signed)
Staffing called for safety sitter request.

## 2019-05-27 ENCOUNTER — Inpatient Hospital Stay (HOSPITAL_COMMUNITY): Payer: Medicare Other

## 2019-05-27 DIAGNOSIS — I4892 Unspecified atrial flutter: Secondary | ICD-10-CM | POA: Diagnosis present

## 2019-05-27 DIAGNOSIS — R471 Dysarthria and anarthria: Secondary | ICD-10-CM | POA: Diagnosis present

## 2019-05-27 DIAGNOSIS — E785 Hyperlipidemia, unspecified: Secondary | ICD-10-CM | POA: Diagnosis present

## 2019-05-27 DIAGNOSIS — I2510 Atherosclerotic heart disease of native coronary artery without angina pectoris: Secondary | ICD-10-CM | POA: Diagnosis present

## 2019-05-27 DIAGNOSIS — I69320 Aphasia following cerebral infarction: Secondary | ICD-10-CM | POA: Diagnosis not present

## 2019-05-27 DIAGNOSIS — E1165 Type 2 diabetes mellitus with hyperglycemia: Secondary | ICD-10-CM | POA: Diagnosis present

## 2019-05-27 DIAGNOSIS — G9341 Metabolic encephalopathy: Secondary | ICD-10-CM | POA: Diagnosis present

## 2019-05-27 DIAGNOSIS — I472 Ventricular tachycardia: Secondary | ICD-10-CM | POA: Diagnosis present

## 2019-05-27 DIAGNOSIS — I619 Nontraumatic intracerebral hemorrhage, unspecified: Secondary | ICD-10-CM | POA: Diagnosis present

## 2019-05-27 DIAGNOSIS — Z8673 Personal history of transient ischemic attack (TIA), and cerebral infarction without residual deficits: Secondary | ICD-10-CM | POA: Diagnosis not present

## 2019-05-27 DIAGNOSIS — I340 Nonrheumatic mitral (valve) insufficiency: Secondary | ICD-10-CM

## 2019-05-27 DIAGNOSIS — I255 Ischemic cardiomyopathy: Secondary | ICD-10-CM | POA: Diagnosis present

## 2019-05-27 DIAGNOSIS — J449 Chronic obstructive pulmonary disease, unspecified: Secondary | ICD-10-CM | POA: Diagnosis present

## 2019-05-27 DIAGNOSIS — R29810 Facial weakness: Secondary | ICD-10-CM | POA: Diagnosis present

## 2019-05-27 DIAGNOSIS — R233 Spontaneous ecchymoses: Secondary | ICD-10-CM | POA: Diagnosis present

## 2019-05-27 DIAGNOSIS — Z66 Do not resuscitate: Secondary | ICD-10-CM | POA: Diagnosis present

## 2019-05-27 DIAGNOSIS — R1310 Dysphagia, unspecified: Secondary | ICD-10-CM | POA: Diagnosis present

## 2019-05-27 DIAGNOSIS — I6340 Cerebral infarction due to embolism of unspecified cerebral artery: Secondary | ICD-10-CM | POA: Diagnosis present

## 2019-05-27 DIAGNOSIS — E1151 Type 2 diabetes mellitus with diabetic peripheral angiopathy without gangrene: Secondary | ICD-10-CM | POA: Diagnosis present

## 2019-05-27 DIAGNOSIS — E669 Obesity, unspecified: Secondary | ICD-10-CM | POA: Diagnosis present

## 2019-05-27 DIAGNOSIS — Z20828 Contact with and (suspected) exposure to other viral communicable diseases: Secondary | ICD-10-CM | POA: Diagnosis present

## 2019-05-27 DIAGNOSIS — I63441 Cerebral infarction due to embolism of right cerebellar artery: Secondary | ICD-10-CM | POA: Diagnosis not present

## 2019-05-27 DIAGNOSIS — I4891 Unspecified atrial fibrillation: Secondary | ICD-10-CM | POA: Diagnosis present

## 2019-05-27 DIAGNOSIS — L8990 Pressure ulcer of unspecified site, unspecified stage: Secondary | ICD-10-CM | POA: Insufficient documentation

## 2019-05-27 DIAGNOSIS — I639 Cerebral infarction, unspecified: Secondary | ICD-10-CM | POA: Diagnosis present

## 2019-05-27 DIAGNOSIS — N179 Acute kidney failure, unspecified: Secondary | ICD-10-CM | POA: Diagnosis not present

## 2019-05-27 DIAGNOSIS — L89312 Pressure ulcer of right buttock, stage 2: Secondary | ICD-10-CM | POA: Diagnosis present

## 2019-05-27 DIAGNOSIS — I110 Hypertensive heart disease with heart failure: Secondary | ICD-10-CM | POA: Diagnosis present

## 2019-05-27 DIAGNOSIS — R338 Other retention of urine: Secondary | ICD-10-CM | POA: Diagnosis not present

## 2019-05-27 DIAGNOSIS — I5022 Chronic systolic (congestive) heart failure: Secondary | ICD-10-CM | POA: Diagnosis present

## 2019-05-27 LAB — RAPID URINE DRUG SCREEN, HOSP PERFORMED
Amphetamines: NOT DETECTED
Barbiturates: NOT DETECTED
Benzodiazepines: NOT DETECTED
Cocaine: NOT DETECTED
Opiates: NOT DETECTED
Tetrahydrocannabinol: NOT DETECTED

## 2019-05-27 LAB — LIPID PANEL
Cholesterol: 169 mg/dL (ref 0–200)
HDL: 43 mg/dL
LDL Cholesterol: 108 mg/dL — ABNORMAL HIGH (ref 0–99)
Total CHOL/HDL Ratio: 3.9 ratio
Triglycerides: 88 mg/dL
VLDL: 18 mg/dL (ref 0–40)

## 2019-05-27 LAB — HEMOGLOBIN A1C
Hgb A1c MFr Bld: 7.3 % — ABNORMAL HIGH (ref 4.8–5.6)
Mean Plasma Glucose: 162.81 mg/dL

## 2019-05-27 LAB — GLUCOSE, CAPILLARY
Glucose-Capillary: 164 mg/dL — ABNORMAL HIGH (ref 70–99)
Glucose-Capillary: 171 mg/dL — ABNORMAL HIGH (ref 70–99)

## 2019-05-27 LAB — CBG MONITORING, ED
Glucose-Capillary: 210 mg/dL — ABNORMAL HIGH (ref 70–99)
Glucose-Capillary: 136 mg/dL — ABNORMAL HIGH (ref 70–99)

## 2019-05-27 LAB — ECHOCARDIOGRAM COMPLETE

## 2019-05-27 MED ORDER — HALOPERIDOL LACTATE 5 MG/ML IJ SOLN
1.0000 mg | Freq: Once | INTRAMUSCULAR | Status: AC
  Administered 2019-05-27: 1 mg via INTRAMUSCULAR
  Filled 2019-05-27: qty 1

## 2019-05-27 MED ORDER — HALOPERIDOL LACTATE 5 MG/ML IJ SOLN
2.0000 mg | Freq: Once | INTRAMUSCULAR | Status: AC
  Administered 2019-05-27: 2 mg via INTRAMUSCULAR
  Filled 2019-05-27: qty 1

## 2019-05-27 MED ORDER — METOPROLOL TARTRATE 5 MG/5ML IV SOLN: 5.0000 mg | Freq: Four times a day (QID) | INTRAVENOUS | Status: DC

## 2019-05-27 NOTE — ED Notes (Signed)
Pt continues to be restless.  

## 2019-05-27 NOTE — ED Notes (Signed)
Pt. Continues to be restless and is calling out for someone to bring matches. MD paged. 1 to 1 sitter in place

## 2019-05-27 NOTE — ED Notes (Signed)
Patient is resting comfortably. Sitter at the bedside

## 2019-05-27 NOTE — ED Notes (Signed)
Pt. Became restless and  agitated again.Pt continued to pull off cords and after replacing them  Pt. Pulled out second IV. Contacted consulting doctor. Per Dr. Baltazar Najjar restraints good for 12 hours and placed back on pt. Medication orders also given

## 2019-05-27 NOTE — ED Notes (Signed)
Md paged  reference continued agitation of Pt. However was able to get vital wdl. Although vitals are wdl, Pt. Continues to wiggle out of pulse ox and chest leads. Continue to reapply

## 2019-05-27 NOTE — ED Notes (Signed)
Admitting MD notified regarding pt hypotension.

## 2019-05-27 NOTE — ED Notes (Signed)
Restraints removed at this time. PT in to work with patient.

## 2019-05-27 NOTE — Progress Notes (Signed)
*  PRELIMINARY RESULTS* Echocardiogram 2D Echocardiogram has been performed.  Tristan Myers 05/27/2019, 4:36 PM

## 2019-05-27 NOTE — Progress Notes (Signed)
STROKE TEAM PROGRESS NOTE   INTERVAL HISTORY Patient is drowsy and has received 3 doses of Haldol overnight.  I have reviewed history of presenting illness, imaging films in PACS as well as electronic medical records.  CT scan shows hemorrhagic right cerebellar infarct without significant mass-effect or hydrocephalus.  CT angiogram was ordered but patient was not cooperative for the test  Vitals:   05/27/19 0530 05/27/19 0615 05/27/19 0655 05/27/19 0900  BP: (!) 155/104 (!) 179/95 (!) 151/82 (!) 116/98  Pulse: 94 95 87   Resp:   16   Temp:      TempSrc:      SpO2: 97% 97% 96%     CBC:  Recent Labs  Lab 05/26/19 0839 05/26/19 0856  WBC 11.3*  --   NEUTROABS 8.4*  --   HGB 13.9 13.9  HCT 41.3 41.0  MCV 96.0  --   PLT 168  --     Basic Metabolic Panel:  Recent Labs  Lab 05/26/19 0839 05/26/19 0856  NA 137 137  K 4.6 4.5  CL 101 103  CO2 23  --   GLUCOSE 188* 188*  BUN 14 15  CREATININE 0.74 0.60*  CALCIUM 9.1  --    Lipid Panel:     Component Value Date/Time   CHOL 169 05/27/2019 0456   TRIG 88 05/27/2019 0456   HDL 43 05/27/2019 0456   CHOLHDL 3.9 05/27/2019 0456   VLDL 18 05/27/2019 0456   LDLCALC 108 (H) 05/27/2019 0456   HgbA1c:  Lab Results  Component Value Date   HGBA1C 7.3 (H) 05/27/2019   Urine Drug Screen: No results found for: LABOPIA, COCAINSCRNUR, LABBENZ, AMPHETMU, THCU, LABBARB  Alcohol Level No results found for: ETH  IMAGING Ct Head Wo Contrast  Result Date: 05/26/2019 CLINICAL DATA:  Multiple falls, weakness EXAM: CT HEAD WITHOUT CONTRAST TECHNIQUE: Contiguous axial images were obtained from the base of the skull through the vertex without intravenous contrast. COMPARISON:  2011 FINDINGS: Brain: There is an area of low attenuation with superimposed increased density within the right cerebellar hemisphere extending into the vermis. No significant mass effect. There are chronic infarctions of the right frontoparietal lobes and left  temporoparietal lobes with associated volume loss. Prominence of the ventricles and sulci reflects superimposed generalized parenchymal volume loss. Additional patchy hypoattenuation in the supratentorial white matter is nonspecific but probably reflects mild chronic microvascular ischemic changes. Vascular: No hyperdense vessel or unexpected calcification. Skull: Calvarium is unremarkable. Sinuses/Orbits: Mild paranasal sinus mucosal thickening. No acute intraorbital abnormality. Other: Mastoid air cells are clear. IMPRESSION: Likely subacute right cerebellar infarction with mild reperfusion hemorrhage. No significant mass effect. Follow-up CT or MR imaging is recommended to exclude a mass. Chronic/nonemergent findings detailed above. Electronically Signed   By: Guadlupe Spanish M.D.   On: 05/26/2019 10:30   Vas US Carotid (at [facility])  Result Date: 05/26/2019 Carotid Arterial Duplex Study Indications:       CVA. Risk Factors:      Hypertension, hyperlipidemia, Diabetes, coronary artery                    disease, prior CVA. Comparison Study:  no prior Performing Technologist: Blanch Media RVS  Examination Guidelines: A complete evaluation includes B-mode imaging, spectral Doppler, color Doppler, and power Doppler as needed of all accessible portions of each vessel. Bilateral testing is considered an integral part of a complete examination. Limited examinations for reoccurring indications may be performed as noted.  Right Carotid Findings: +----------+--------+--------+--------+------------------+--------+           PSV cm/sEDV cm/sStenosisPlaque DescriptionComments +----------+--------+--------+--------+------------------+--------+ CCA Prox  50      10              heterogenous               +----------+--------+--------+--------+------------------+--------+ CCA Distal58      11              heterogenous               +----------+--------+--------+--------+------------------+--------+  ICA Prox  120     19      1-39%   heterogenous               +----------+--------+--------+--------+------------------+--------+ ICA Distal80      21                                         +----------+--------+--------+--------+------------------+--------+ ECA       75                                                 +----------+--------+--------+--------+------------------+--------+ +----------+--------+-------+--------+-------------------+           PSV cm/sEDV cmsDescribeArm Pressure (mmHG) +----------+--------+-------+--------+-------------------+ [id number]                                        +----------+--------+-------+--------+-------------------+ +---------+--------+--+--------+-+---------+ VertebralPSV cm/s40EDV cm/s8Antegrade +---------+--------+--+--------+-+---------+  Left Carotid Findings: +----------+--------+--------+--------+------------------+---------+           PSV cm/sEDV cm/sStenosisPlaque DescriptionComments  +----------+--------+--------+--------+------------------+---------+ CCA Prox  69      12              heterogenous                +----------+--------+--------+--------+------------------+---------+ CCA Distal75      17              heterogenous                +----------+--------+--------+--------+------------------+---------+ ICA Prox  97      24      1-39%   heterogenous      Shadowing +----------+--------+--------+--------+------------------+---------+ ICA Distal71      17                                          +----------+--------+--------+--------+------------------+---------+ ECA       83                                                  +----------+--------+--------+--------+------------------+---------+ +----------+--------+--------+--------+-------------------+           PSV cm/sEDV cm/sDescribeArm Pressure (mmHG) +----------+--------+--------+--------+-------------------+  [id number]                                         +----------+--------+--------+--------+-------------------+ +---------+--------+--+--------+-+---------+ VertebralPSV cm/s29EDV cm/s5Antegrade +---------+--------+--+--------+-+---------+  Summary:   *  See table(s) above for measurements and observations.  Electronically signed by Harold Barban MD on 05/26/2019 at 5:21:33 PM.    Final     PHYSICAL EXAM Frail elderly male not in distress. . Afebrile. Head is nontraumatic. Neck is supple without bruit.    Cardiac exam no murmur or gallop. Lungs are clear to auscultation. Distal pulses are well felt. Neurological Exam : Patient is drowsy and sedated.  He can be aroused speech appears to be dysarthric is disoriented.  Follows only simple midline commands.  He gets agitated and restless.  Extraocular movements appear full range.  He blinks to threat bilaterally.  Face appears symmetric.  Tongue midline.  He is able to move all 4 extremities against gravity but is not cooperative for detailed muscle strength or coordination testing.  Deep tendon flexes symmetric.  Plantars are downgoing.  Gait not tested.   ASSESSMENT/PLAN Tristan Myers is a 78 y.o. male with history of AF on warfarin in the past but not now, CVA, CAD s/p CABG, DM, HLD, PVD, ICD placement presenting from home with 4 day hx of altered mental status with speech and gait changes.   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   CTA head & neck pending   Carotid Doppler  pending   2D Echo pending   LDL 108  HgbA1c 7.3  SCDs for VTE prophylaxis  aspirin 81 mg daily prior to admission, now on No antithrombotic given petechial hemorrhage post infarct   Therapy recommendations:  pending   Disposition:  pending (lived home PTA)  Agitation  Received 3 doses haldol during the  night  Now difficult to waken  Stat CTA head and neck ordered but too agitated to get  Atrial Fibrillation  Home anticoagulation:  none, previously on warfarin   Not on AC at this time d/t acute infarct w/ petechial hemorrhage currently  Followed by Dr. Caryl Comes    Hx stroke/TIA  01/2010 - L MCA infarct w/ hemorrhagic transformation in setting of INR 4.16. refused to go to f/u MD or therapy appts but did take meds as prescribed after stroke in July 2011. INR reversed and decision made for no AC and to keep on aspirin  12/2009 - R MCA infarct w/ residual expressive aphasia, embolic d/t embolic etiology , TEE neg but pt in aflutter during procedure, started on warfarin  Elvia Collum, MD, neuro)  Hypertension  Variable SBP range 70-190s past 24h . Permissive hypertension (OK if < 220/120) but gradually normalize in 5-7 days . Long-term BP goal normotensive  Hyperlipidemia  Home meds:  crestor 10, resumed in hospital  LDL 108, goal < 70  Continue statin at discharge  Diabetes type II Uncontrolled  HgbA1c 7.3, goal < 7.0  CBGs  SSI  Dysphagia . Secondary to stroke . NPO . Speech on board   Other Stroke Risk Factors  Advanced age  Former Cigarette smoker  Coronary artery disease s/p CABG, ICD implant in 1997  Hx DVT LUE s/p ICD placement   PVD  Hospital day # 1 Patient has presented with a what appears to be embolic right cerebellar infarct with CT scan showing some previous old cortical infarcts as well likely of cardioembolic etiology given history of atrial fibrillation/flutter.  Check CT angiogram of brain and neck, echocardiogram.  Continue cardiac monitoring.  Continue aspirin for stroke prevention  for now given hemorrhagic conversion but at time of discharge will have to make a decision about long-term anticoagulation if the benefit outweighs the risk.  Discussed with Dr. Jomarie Longs.  Greater than 50% time during this 35-minute visit was spent on coordination  of care and discussion with care team. Delia Heady, MD To contact Stroke Continuity provider, please refer to [email or web address]. After hours, contact General Neurology

## 2019-05-27 NOTE — ED Notes (Signed)
Neurology at the bedside

## 2019-05-27 NOTE — Plan of Care (Signed)
Patient is not able to verbalized what brought him to the hospital at this time. Spoke to wife on the phone and she was able to give history on events leading up to ED visit.

## 2019-05-27 NOTE — Progress Notes (Signed)
Rehab Admissions Coordinator Note:  Per PT recommendation, this patient was screened by Raechel Ache for appropriateness for an Inpatient Acute Rehab Consult.  At this time, we are recommending Inpatient Rehab consult.   AC will place consult order in the chart to allow for further assessment of pt's candidacy for IP Rehab.   Raechel Ache 05/27/2019, 4:35 PM  I can be reached at [phone].

## 2019-05-27 NOTE — Evaluation (Addendum)
Physical Therapy Evaluation Patient Details Name: Tristan Myers MRN: [id number] DOB: April 02, 1941 Today's Date: 05/27/2019   History of Present Illness  Tristan Myers is a 78 y.o. 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  Clinical Impression  Patient presents with decreased mobility due to decreased balance, decreased safety and deficit awareness, decreased strength and impaired perception all limiting independence.  Currently mod to max A for mobility at the bedside unable to take steps this session away from bed.  Previously was able to chop wood per chart.  Patient unable to give history, but note he is from home with wife.  Would recommend CIR consult for further rehab prior to d/c if wife able to give assist upon d/c.  PT to follow acutely.     Follow Up Recommendations CIR    Equipment Recommendations  Other (comment)(TBA)    Recommendations for Other Services Rehab consult     Precautions / Restrictions Precautions Precautions: Fall      Mobility  Bed Mobility Overal bed mobility: Needs Assistance Bed Mobility: Rolling;Sidelying to Sit;Sit to Supine Rolling: Mod assist Sidelying to sit: Max assist   Sit to supine: Max assist   General bed mobility comments: assist for trunk and legs  Transfers Overall transfer level: Needs assistance Equipment used: Rolling walker (2 wheeled) Transfers: Sit to/from Stand       Anterior-Posterior transfers: Mod assist;Max assist   General transfer comment: up from stretcher in ED with RW and initially max A to stand, demonstrating posterior bias leaning up against edge of bed and weight shifted L with trunk rotated R and L lateral lean; performed several more sit to stands with RW (about 4) able to progress to mod A but  still with balance/postural deficits; improved head positioning and less posterior bias at times  Ambulation/Gait Ambulation/Gait assistance: Mod assist;Max assist Gait Distance (Feet): 1 Feet Assistive device: Rolling walker (2 wheeled) Gait Pattern/deviations: Step-to pattern;Shuffle;Wide base of support     General Gait Details: took couple of shuffling steps at bedside but not able to progress away from EOB due to balance/perceptual issues  Stairs            Wheelchair Mobility    Modified Rankin (Stroke Patients Only) Modified Rankin (Stroke Patients Only) Pre-Morbid Rankin Score: No significant disability Modified Rankin: Severe disability     Balance Overall balance assessment: Needs assistance Sitting-balance support: Feet unsupported;Single extremity supported Sitting balance-Leahy Scale: Poor Sitting balance - Comments: leaning back initially with mod A for balance, then able to balance with S after sitting up about 3 minutes with support, cues for head and trunk position   Standing balance support: Bilateral upper extremity supported Standing balance-Leahy Scale: Poor Standing balance comment: posterior bias, L weight shift, R trunk rotation, L lateral flexion                             Pertinent Vitals/Pain Pain Assessment: Faces Faces Pain Scale: Hurts little more Pain Location: groaning when I help him to sit up Pain Descriptors / Indicators: Grimacing;Moaning Pain Intervention(s): Monitored during session;Repositioned;Limited activity within patient's tolerance    Home Living Family/patient expects to be discharged to:: Unsure  Prior Function           Comments: unable to give prior funciton and home environment info due to cognition     Hand Dominance        Extremity/Trunk Assessment   Upper Extremity Assessment Upper Extremity Assessment: Generalized weakness    Lower Extremity  Assessment Lower Extremity Assessment: Generalized weakness    Cervical / Trunk Assessment Cervical / Trunk Assessment: Other exceptions Cervical / Trunk Exceptions: cervical stiffness keeping head tilted L and extended  Communication   Communication: Expressive difficulties(mumbling and incoherent)  Cognition Arousal/Alertness: Lethargic Behavior During Therapy: Restless Overall Cognitive Status: Impaired/Different from baseline Area of Impairment: Orientation;Attention;Safety/judgement;Following commands                 Orientation Level: Place;Time;Situation;Disoriented to Current Attention Level: Focused   Following Commands: Follows one step commands inconsistently Safety/Judgement: Decreased awareness of safety;Decreased awareness of deficits     General Comments: Able to state his wife's name is Vaughan Basta, otherwise unable to answer questions and mumbling incoherent throughout      General Comments      Exercises Other Exercises Other Exercises: Seated EOB performed AAROM/PROM to head/neck   Assessment/Plan    PT Assessment Patient needs continued PT services  PT Problem List Decreased strength;Decreased mobility;Decreased cognition;Decreased safety awareness;Decreased balance;Decreased coordination;Decreased knowledge of use of DME       PT Treatment Interventions DME instruction;Therapeutic activities;Balance training;Cognitive remediation;Patient/family education;Neuromuscular re-education;Therapeutic exercise;Functional mobility training;Gait training    PT Goals (Current goals can be found in the Care Plan section)  Acute Rehab PT Goals Patient Stated Goal: unable to state PT Goal Formulation: Patient unable to participate in goal setting Time For Goal Achievement: 06/10/19 Potential to Achieve Goals: Fair    Frequency Min 3X/week   Barriers to discharge        Co-evaluation               AM-PAC PT "6 Clicks" Mobility  Outcome Measure Help  needed turning from your back to your side while in a flat bed without using bedrails?: A Lot Help needed moving from lying on your back to sitting on the side of a flat bed without using bedrails?: Total Help needed moving to and from a bed to a chair (including a wheelchair)?: Total Help needed standing up from a chair using your arms (e.g., wheelchair or bedside chair)?: A Lot Help needed to walk in hospital room?: Total Help needed climbing 3-5 steps with a railing? : Total 6 Click Score: 8    End of Session   Activity Tolerance: Patient limited by fatigue Patient left: in bed;with call bell/phone within reach Nurse Communication: Mobility status PT Visit Diagnosis: Other abnormalities of gait and mobility (R26.89);Other symptoms and signs involving the nervous system (R29.898);Muscle weakness (generalized) (M62.81)    Time: 2694-8546 PT Time Calculation (min) (ACUTE ONLY): 33 min   Charges:   PT Evaluation $PT Eval Moderate Complexity: 1 Mod PT Treatments $Therapeutic Activity: 8-22 mins        Magda Kiel, [place] [facility] [phone] 05/27/2019   Reginia Naas 05/27/2019, 12:18 PM

## 2019-05-27 NOTE — ED Notes (Signed)
Pt continues to try to climb out of bed.

## 2019-05-27 NOTE — Progress Notes (Signed)
PROGRESS NOTE    Tristan Myers  XFG:[id number] DOB: 1941/04/06 DOA: 05/26/2019 PCP: Leonard Downing, MD  Brief Narrative: 78 year old male with history of diabetes mellitus, CAD, CABG, history of VTE, AICD, peripheral vascular disease, CVA,, patient had a left MCA embolic stroke in July 9678, during this time he was started on Coumadin for anticoagulation, unfortunately he refused to follow-up for Coumadin checks and was admitted in 01/2010 with hemorrhagic conversion of his left MCA stroke, at this time Coumadin was discontinued and he was started on aspirin at discharge. -According to his wife he was outside cutting wood on 12/5, subsequently complained of generalized weakness, attempted to eat something and suddenly became dizzy, subsequently started having vomiting, following this has been weak, unable to stand without assistance. -Subsequently brought to the emergency room, he has been confused and agitated ever since requiring restraints, Haldol -CT head showed subacute moderate sized right cerebellar infarction with mild reperfusion hemorrhage  Assessment & Plan:   Subacute CVA CT notes moderate-sized right cerebellar infarction with mild reperfusion hemorrhage, at risk of complications including hydrocephalus -Remains confused, with periodic agitation requiring restraints and Haldol -Aspirin on hold -History of atrial fibrillation, and prior embolic left MCA stroke in 12/2009, treated with Coumadin at that time, failed to follow-up and admitted the following month with hemorrhagic transformation of left MCA stroke, during this time Coumadin was stopped and he had been on aspirin alone since -Neurology following -CTA head and neck ordered, will follow up, unable to have MRI due to AICD -At risk of decline, he is DNR, attempted to contact wife Draycen Leichter x3 at [phone] , unsuccessful -Start IV fluids -SLP to follow-up -Monitor for seizures as well  CAD/CABG/ventricular  tachycardia -History of AICD -Continue metoprolol and sotalol -Will change metoprolol to IV if unable to take this p.o.  History of atrial fibrillation -Continue sotalol, change metoprolol to IV -Unable to anticoagulate in the setting of hemorrhagic transformation  Type 2 diabetes mellitus Continue Lantus at lower dose, sliding scale insulin Hemoglobin A1c is 7.3   DVT prophylaxis: SCDs Code Status: DNR Family Communication: Patient seen in the ED, no family at bedside, attempted to contact wife Kamon Fahr x3 Disposition Plan: To be determined, will likely need rehab  Consultants:   Neurology   Procedures:   Antimicrobials:    Subjective: -Confused agitated, was attempting to get out of bed multiple times, subsequently given Haldol by ED staff Objective: Vitals:   05/27/19 1015 05/27/19 1019 05/27/19 1030 05/27/19 1156  BP: (!) 78/51 (!) 77/61 (!) 98/57 107/63  Pulse:      Resp:      Temp:      TempSrc:      SpO2:       No intake or output data in the 24 hours ending 05/27/19 1456 There were no vitals filed for this visit.  Examination:  General exam: Somnolent arousable, dysarthric, disoriented Respiratory system: Diminished breath sounds in the bases, otherwise clear Cardiovascular system: S1 & S2 heard, RRR  Gastrointestinal system: Abdomen is nondistended, soft and nontender.Normal bowel sounds heard. Central nervous system: Somnolent, arousable, dysarthric, limited exam, would not follow commands, moves all extremities voluntarily Extremities: No edema Skin: No rashes, lesions or ulcers Psychiatry: Poor insight and judgment   Data Reviewed:   CBC: Recent Labs  Lab 05/26/19 0839 05/26/19 0856  WBC 11.3*  --   NEUTROABS 8.4*  --   HGB 13.9 13.9  HCT 41.3 41.0  MCV 96.0  --  PLT 168  --    Basic Metabolic Panel: Recent Labs  Lab 05/26/19 0839 05/26/19 0856  NA 137 137  K 4.6 4.5  CL 101 103  CO2 23  --   GLUCOSE 188* 188*  BUN 14 15    CREATININE 0.74 0.60*  CALCIUM 9.1  --    GFR: CrCl cannot be calculated (Unknown ideal weight.). Liver Function Tests: Recent Labs  Lab 05/26/19 0839  AST 24  ALT 31  ALKPHOS 58  BILITOT 1.4*  PROT 7.4  ALBUMIN 3.8   No results for input(s): LIPASE, AMYLASE in the last 168 hours. No results for input(s): AMMONIA in the last 168 hours. Coagulation Profile: Recent Labs  Lab 05/26/19 0839  INR 1.0   Cardiac Enzymes: No results for input(s): CKTOTAL, CKMB, CKMBINDEX, TROPONINI in the last 168 hours. BNP (last 3 results) No results for input(s): PROBNP in the last 8760 hours. HbA1C: Recent Labs    05/27/19 0456  HGBA1C 7.3*   CBG: Recent Labs  Lab 05/26/19 1728 05/26/19 2008 05/26/19 2215 05/27/19 0755 05/27/19 1125  GLUCAP 203* 176* 175* 210* 136*   Lipid Profile: Recent Labs    05/27/19 0456  CHOL 169  HDL 43  LDLCALC 108*  TRIG 88  CHOLHDL 3.9   Thyroid Function Tests: No results for input(s): TSH, T4TOTAL, FREET4, T3FREE, THYROIDAB in the last 72 hours. Anemia Panel: No results for input(s): VITAMINB12, FOLATE, FERRITIN, TIBC, IRON, RETICCTPCT in the last 72 hours. Urine analysis:    Component Value Date/Time   COLORURINE YELLOW 05/26/2019 0839   APPEARANCEUR HAZY (A) 05/26/2019 0839   LABSPEC 1.018 05/26/2019 0839   PHURINE 8.0 05/26/2019 0839   GLUCOSEU 50 (A) 05/26/2019 0839   HGBUR NEGATIVE 05/26/2019 0839   BILIRUBINUR NEGATIVE 05/26/2019 0839   KETONESUR 20 (A) 05/26/2019 0839   PROTEINUR 30 (A) 05/26/2019 0839   UROBILINOGEN 0.2 01/23/2010 1231   NITRITE NEGATIVE 05/26/2019 0839   LEUKOCYTESUR NEGATIVE 05/26/2019 0839   Sepsis Labs: (procalcitonin:4,lacticidven:4)  ) Recent Results (from the past 240 hour(s))  SARS CORONAVIRUS 2 (TAT 6-24 HRS) Nasopharyngeal Nasopharyngeal Swab     Status: None   Collection Time: 05/26/19  3:19 PM   Specimen: Nasopharyngeal Swab  Result Value Ref Range Status   SARS Coronavirus 2  NEGATIVE NEGATIVE Final    Comment: (NOTE) SARS-CoV-2 target nucleic acids are NOT DETECTED. The SARS-CoV-2 RNA is generally detectable in upper and lower respiratory specimens during the acute phase of infection. Negative results do not preclude SARS-CoV-2 infection, do not rule out co-infections with other pathogens, and should not be used as the sole basis for treatment or other patient management decisions. Negative results must be combined with clinical observations, patient history, and epidemiological information. The expected result is Negative. Fact Sheet for Patients: HairSlick.no Fact Sheet for Healthcare Providers: quierodirigir.com This test is not yet approved or cleared by the [place] FDA and  has been authorized for detection and/or diagnosis of SARS-CoV-2 by FDA under an Emergency Use Authorization (EUA). This EUA will remain  in effect (meaning this test can be used) for the duration of the COVID-19 declaration under Section 56 4(b)(1) of the Act, 21 U.S.C. section 360bbb-3(b)(1), unless the authorization is terminated or revoked sooner. Performed at [facility], [address] [place], [place] [address]          Radiology Studies: Ct Head Wo Contrast  Result Date: 05/26/2019 CLINICAL DATA:  Multiple falls, weakness EXAM: CT HEAD WITHOUT  CONTRAST TECHNIQUE: Contiguous axial images were obtained from the base of the skull through the vertex without intravenous contrast. COMPARISON:  2011 FINDINGS: Brain: There is an area of low attenuation with superimposed increased density within the right cerebellar hemisphere extending into the vermis. No significant mass effect. There are chronic infarctions of the right frontoparietal lobes and left temporoparietal lobes with associated volume loss. Prominence of the ventricles and sulci reflects superimposed generalized parenchymal volume loss. Additional  patchy hypoattenuation in the supratentorial white matter is nonspecific but probably reflects mild chronic microvascular ischemic changes. Vascular: No hyperdense vessel or unexpected calcification. Skull: Calvarium is unremarkable. Sinuses/Orbits: Mild paranasal sinus mucosal thickening. No acute intraorbital abnormality. Other: Mastoid air cells are clear. IMPRESSION: Likely subacute right cerebellar infarction with mild reperfusion hemorrhage. No significant mass effect. Follow-up CT or MR imaging is recommended to exclude a mass. Chronic/nonemergent findings detailed above. Electronically Signed   By: Guadlupe SpanishPraneil  Patel M.D.   On: 05/26/2019 10:30   [facility] [place] [facility]  Result Date: 05/26/2019 Carotid Arterial Duplex Study Indications:       CVA. Risk Factors:      Hypertension, hyperlipidemia, Diabetes, coronary artery                    disease, prior CVA. Comparison Study:  no prior Performing Technologist: Blanch MediaMegan Riddle RVS  Examination Guidelines: A complete evaluation includes B-mode imaging, spectral Doppler, color Doppler, and power Doppler as needed of all accessible portions of each vessel. Bilateral testing is considered an integral part of a complete examination. Limited examinations for reoccurring indications may be performed as noted.  Right Carotid Findings: +----------+--------+--------+--------+------------------+--------+             PSV cm/s EDV cm/s Stenosis Plaque Description Comments  +----------+--------+--------+--------+------------------+--------+  CCA Prox   50       10                heterogenous                 +----------+--------+--------+--------+------------------+--------+  CCA Distal 58       11                heterogenous                 +----------+--------+--------+--------+------------------+--------+  ICA Prox   120      19       1-39%    heterogenous                 +----------+--------+--------+--------+------------------+--------+  ICA Distal 80       21                                              +----------+--------+--------+--------+------------------+--------+  ECA        75                                                      +----------+--------+--------+--------+------------------+--------+ +----------+--------+-------+--------+-------------------+             PSV cm/s EDV cms Describe Arm Pressure (mmHG)  +----------+--------+-------+--------+-------------------+  Subclavian 123                                            +----------+--------+-------+--------+-------------------+ +---------+--------+--+--------+-+---------+  Vertebral PSV cm/s 40 EDV cm/s 8 Antegrade  +---------+--------+--+--------+-+---------+  Left Carotid Findings: +----------+--------+--------+--------+------------------+---------+             PSV cm/s EDV cm/s Stenosis Plaque Description Comments   +----------+--------+--------+--------+------------------+---------+  CCA Prox   69       12                heterogenous                  +----------+--------+--------+--------+------------------+---------+  CCA Distal 75       17                heterogenous                  +----------+--------+--------+--------+------------------+---------+  ICA Prox   97       24       1-39%    heterogenous       Shadowing  +----------+--------+--------+--------+------------------+---------+  ICA Distal 71       17                                              +----------+--------+--------+--------+------------------+---------+  ECA        83                                                       +----------+--------+--------+--------+------------------+---------+ +----------+--------+--------+--------+-------------------+             PSV cm/s EDV cm/s Describe Arm Pressure (mmHG)  +----------+--------+--------+--------+-------------------+  Subclavian 109                                             +----------+--------+--------+--------+-------------------+ +---------+--------+--+--------+-+---------+   Vertebral PSV cm/s 29 EDV cm/s 5 Antegrade  +---------+--------+--+--------+-+---------+  Summary:   *See table(s) above for measurements and observations.  Electronically signed by Coral Else MD on 05/26/2019 at 5:21:33 PM.    Final         Scheduled Meds:   stroke: mapping our early stages of recovery book   Does not apply Once   insulin aspart  0-15 Units Subcutaneous TID WC   insulin glargine  20 Units Subcutaneous QHS   metoprolol succinate  25 mg Oral Daily   rosuvastatin  10 mg Oral Q M,W,F   sodium chloride flush  3 mL Intravenous Once   sotalol  120 mg Oral BID   Continuous Infusions:  sodium chloride 50 mL/hr at 05/27/19 1019     LOS: 1 day    Time spent:    Zannie Cove, MD [facility] Page via [email or web address], password [email or web address] After 7PM please contact night-coverage  05/27/2019, 2:56 PM

## 2019-05-27 NOTE — ED Notes (Signed)
Pulse ox replaced. O-2 stats are consistent currently at 96 to 97 for last 4 to 5 minutes

## 2019-05-27 NOTE — ED Notes (Signed)
Pt. Continues to be anxious and fight against restraints. 89 pulse, 97 -O2, 17 resp. BP high at 199/13 is due to fact that he continues to move and pull against restraints. Turned off lights and reduced visual stimuli. Will attempt another set of vitals shortly.

## 2019-05-27 NOTE — Progress Notes (Signed)
Patient is confused, continuing to try to get out of bed, pulling at IV and removing tele monitor leads.  Not following commands   RN paged provider on call   RN will continue to monitor patient

## 2019-05-27 NOTE — ED Notes (Signed)
Checked restraints,  Could get two fingers under bands. Peripheral pulse present in both wrists, hand color and cap reffil wdl

## 2019-05-27 NOTE — ED Notes (Signed)
Pt. Pulled monitor wires off again. Pt. Stated that he had to urinate, I helped him to the bed side and use a urinal. Pt. Was unable to urinate. Pt. Placed back in bed and pt. Laid on side. Will hook back up to monitor momentarily. Checking on status of IV team and second request put in.

## 2019-05-27 NOTE — ED Notes (Signed)
PAGED DR KIRBY TO RN JOE--Tristan Myers 

## 2019-05-28 ENCOUNTER — Inpatient Hospital Stay (HOSPITAL_COMMUNITY): Payer: Medicare Other

## 2019-05-28 LAB — GLUCOSE, CAPILLARY
Glucose-Capillary: 155 mg/dL — ABNORMAL HIGH (ref 70–99)
Glucose-Capillary: 197 mg/dL — ABNORMAL HIGH (ref 70–99)
Glucose-Capillary: 197 mg/dL — ABNORMAL HIGH (ref 70–99)
Glucose-Capillary: 130 mg/dL — ABNORMAL HIGH (ref 70–99)

## 2019-05-28 LAB — CBC
HCT: 42.3 % (ref 39.0–52.0)
Hemoglobin: 14.3 g/dL (ref 13.0–17.0)
MCH: 32.3 pg (ref 26.0–34.0)
MCHC: 33.8 g/dL (ref 30.0–36.0)
MCV: 95.5 fL (ref 80.0–100.0)
Platelets: 178 10*3/uL (ref 150–400)
RBC: 4.43 MIL/uL (ref 4.22–5.81)
RDW: 13.1 % (ref 11.5–15.5)
WBC: 16 10*3/uL — ABNORMAL HIGH (ref 4.0–10.5)
nRBC: 0 % (ref 0.0–0.2)

## 2019-05-28 LAB — BASIC METABOLIC PANEL
Anion gap: 14 (ref 5–15)
BUN: 33 mg/dL — ABNORMAL HIGH (ref 8–23)
CO2: 21 mmol/L — ABNORMAL LOW (ref 22–32)
Calcium: 8.9 mg/dL (ref 8.9–10.3)
Chloride: 105 mmol/L (ref 98–111)
Creatinine, Ser: 1.89 mg/dL — ABNORMAL HIGH (ref 0.61–1.24)
GFR calc Af Amer: 39 mL/min — ABNORMAL LOW (ref >60)
GFR calc non Af Amer: 34 mL/min — ABNORMAL LOW (ref >60)
Glucose, Bld: 161 mg/dL — ABNORMAL HIGH (ref 70–99)
Potassium: 3.8 mmol/L (ref 3.5–5.1)
Sodium: 140 mmol/L (ref 135–145)

## 2019-05-28 MED ORDER — METOPROLOL SUCCINATE ER 25 MG PO TB24
25.0000 mg | ORAL_TABLET | Freq: Every day | ORAL | Status: DC
  Administered 2019-05-28 – 2019-05-31 (×4): 25 mg via ORAL
  Filled 2019-05-28 (×4): qty 1

## 2019-05-28 MED ORDER — IOHEXOL 350 MG/ML SOLN
50.0000 mL | Freq: Once | INTRAVENOUS | Status: AC | PRN
  Administered 2019-05-28: 12:00:00 50 mL via INTRAVENOUS

## 2019-05-28 NOTE — Consult Note (Signed)
Physical Medicine and Rehabilitation Consult Reason for Consult: Altered mental status Referring Physician: [facility]   HPI: Tristan Myers is a 78 y.o. 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  Per chart review lives with spouse.  Patient reportedly independent prior to admission.  Presented 05/26/2019 after complaining of generalized weakness after cutting wood outside with bouts of fatigue.  He later developed some vomiting and diarrhea.  Cranial CT scan likely subacute right cerebellar infarct with mild reperfusion hemorrhage.  No significant mass-effect.  Patient did not receive TPA.  Echocardiogram with ejection fraction of 30% without emboli.  Neurology follow-up with work-up currently ongoing.  Admission chemistries unremarkable, urinalysis negative nitrite, urine drug screen negative, SARS coronavirus negative, WBC 11,300.  Chest x-ray no active cardiopulmonary process.  Tolerating a regular diet.  Patient with bouts of agitation and restlessness requiring Haldol.  Therapy evaluation completed with recommendations of physical medicine rehab consult.   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   Diabetes mellitus, type 2 (HCC)    DVT (deep venous thrombosis) (HCC)    OF LEFT ARM FOLLOWING ICD PLACEMENT   Glaucoma    Hyperlipidemia    ICD (implantable cardioverter-defibrillator)[employer]    Inappropriate shocks from ICD (implantable cardioverter-defibrillator)    4/14  atrial tachycardia with antegrade Wenckebach   Obesity    PVD (peripheral vascular disease) (HCC)    S/P CABG x 1    Stroke (HCC)    left brain CVA with aphasia 01/03/10   Ventricular tachycardia (HCC)    Hx of it   Past Surgical History:  Procedure Laterality Date   AORTIC VALVE REPLACEMENT (AVR)/CORONARY ARTERY BYPASS GRAFTING (CABG)     W LIMA TO LAD, SVG TO OM1, AND SVG TO RCA 09/03/1995 DR. PIRJJOAC, AICD IMPLANT MARCH 1997, AICD GENERATOR REPLACEMENT 12/2001   CARDIAC CATHETERIZATION Left 08/1995   CATARACT EXTRACTION     OD   Family History  Problem Relation Age of Onset   Coronary artery disease Other    Diabetes Other    Diabetes Mother    Heart attack Father    Other Sister        MALIGNANT NEOPLASM OF LIVER   Social History:  reports that he has quit smoking. He has never used smokeless tobacco. He reports that he does not drink alcohol or use drugs. Allergies:  Allergies  Allergen Reactions   Ace Inhibitors Cough   Betadine [Povidone Iodine]    Warfarin And Related Other (See Comments)    BLEEDING (NON-SPECIFIC)   Medications Prior to Admission  Medication Sig Dispense Refill   aspirin EC 81 MG tablet Take 81 mg by mouth daily.     insulin NPH-regular Human (70-30) 100 UNIT/ML injection Inject 10-20 Units into the skin 3 (three) times daily before meals. Per sliding scale     losartan (COZAAR) 25 MG tablet Take 1 tablet (25 mg total) by mouth daily. 90 tablet 2   metoprolol succinate (TOPROL-XL) 25 MG  24 hr tablet Take 1 tablet (25 mg total) by mouth daily. 90 tablet 3   Multiple Vitamins-Minerals (PRESERVISION AREDS 2) CAPS Take 1 capsule by mouth daily.     rosuvastatin (CRESTOR) 10 MG tablet Take 1 tablet (10 mg total) by mouth 3 (three) times a week. (Patient taking differently: Take 10 mg by mouth 3 (three) times a week. Mon, Wed, Fri) 39 tablet 2   sotalol (BETAPACE) 80 MG tablet Take 1  1/2 tab 2 times daily NEED OFFICE VISIT FOR MORE  REFILLS (Patient taking differently: Take 120 mg by mouth 2 (two) times daily. Take 1  1/2 tab 2 times daily NEED OFFICE VISIT FOR MORE REFILLS) 270 tablet 3   triamcinolone cream (KENALOG) 0.1 % Apply 1 application topically 4 (four) times daily as needed for rash.     insulin glargine (LANTUS) 100 UNIT/ML injection Inject 20-40 Units into the skin at bedtime. Sliding Scale      Home: Home Living Family/patient expects to be discharged to:: Unsure  Functional History: Prior Function Comments: unable to give prior funciton and home environment info due to cognition Functional Status:  Mobility: Bed Mobility Overal bed mobility: Needs Assistance Bed Mobility: Rolling, Sidelying to Sit, Sit to Supine Rolling: Mod assist Sidelying to sit: Max assist Sit to supine: Max assist General bed mobility comments: assist for trunk and legs Transfers Overall transfer level: Needs assistance Equipment used: Rolling walker (2 wheeled) Transfers: Sit to/from Stand Anterior-Posterior transfers: Mod assist, Max assist General transfer comment: up from stretcher in ED with RW and initially max A to stand, demonstrating posterior bias leaning up against edge of bed and weight shifted L with trunk rotated R and L lateral lean; performed several more sit to stands with RW (about 4) able to progress to mod A but still with balance/postural deficits; improved head positioning and less posterior bias at times Ambulation/Gait Ambulation/Gait assistance: Mod assist, Max assist Gait Distance (Feet): 1 Feet Assistive device: Rolling walker (2 wheeled) Gait Pattern/deviations: Step-to pattern, Shuffle, Wide base of support General Gait Details: took couple of shuffling steps at bedside but not able to progress away from EOB due to balance/perceptual issues    ADL:    Cognition: Cognition Overall Cognitive Status: Impaired/Different from baseline Orientation Level: Oriented to person, Disoriented to  place, Disoriented to time, Disoriented to situation Cognition Arousal/Alertness: Lethargic Behavior During Therapy: Restless Overall Cognitive Status: Impaired/Different from baseline Area of Impairment: Orientation, Attention, Safety/judgement, Following commands Orientation Level: Place, Time, Situation, Disoriented to Current Attention Level: Focused Following Commands: Follows one step commands inconsistently Safety/Judgement: Decreased awareness of safety, Decreased awareness of deficits General Comments: Able to state his wife's name is Vaughan Basta, otherwise unable to answer questions and mumbling incoherent throughout  Blood pressure (!) 116/47, pulse 91, temperature 98 F (36.7 C), temperature source Oral, resp. rate 19, SpO2 97 %.  Physical Exam  Gen: no distress, very lethargic HEENT: oral mucosa pink and moist, NCAT Cardio: Reg rate Chest: normal effort, normal rate of breathing Abd: soft, non-distended Ext: no edema Skin: intact Musculoskeletal: Unable to participate in manual muscle testing due to lethargy. Psych: pleasant, normal affect Neurological:  Patient lethargic but arousable.  He would easily fall back to sleep.  Speech was slurred he did follow some simple commands but overall examination limited as patient had recently received Haldol.   Results for orders placed or performed during the hospital encounter of 05/26/19 (from the past 24 hour(s))  CBG monitoring, ED  Status: Abnormal   Collection Time: 05/27/19  7:55 AM  Result Value Ref Range   Glucose-Capillary 210 (H) 70 - 99 mg/dL  CBG monitoring, ED     Status: Abnormal   Collection Time: 05/27/19 11:25 AM  Result Value Ref Range   Glucose-Capillary 136 (H) 70 - 99 mg/dL  Glucose, capillary     Status: Abnormal   Collection Time: 05/27/19  4:20 PM  Result Value Ref Range   Glucose-Capillary 164 (H) 70 - 99 mg/dL   Comment 1 Notify RN    Comment 2 Document in Chart   Glucose, capillary     Status:  Abnormal   Collection Time: 05/27/19  9:04 PM  Result Value Ref Range   Glucose-Capillary 171 (H) 70 - 99 mg/dL  CBC     Status: Abnormal   Collection Time: 05/28/19  4:17 AM  Result Value Ref Range   WBC 16.0 (H) 4.0 - 10.5 K/uL   RBC 4.43 4.22 - 5.81 MIL/uL   Hemoglobin 14.3 13.0 - 17.0 g/dL   HCT 16.1 09.6 - 04.5 %   MCV 95.5 80.0 - 100.0 fL   MCH 32.3 26.0 - 34.0 pg   MCHC 33.8 30.0 - 36.0 g/dL   RDW 40.9 81.1 - 91.4 %   Platelets 178 150 - 400 K/uL   nRBC 0.0 0.0 - 0.2 %   CT HEAD WO CONTRAST  Result Date: 05/26/2019 CLINICAL DATA:  Multiple falls, weakness EXAM: CT HEAD WITHOUT CONTRAST TECHNIQUE: Contiguous axial images were obtained from the base of the skull through the vertex without intravenous contrast. COMPARISON:  2011 FINDINGS: Brain: There is an area of low attenuation with superimposed increased density within the right cerebellar hemisphere extending into the vermis. No significant mass effect. There are chronic infarctions of the right frontoparietal lobes and left temporoparietal lobes with associated volume loss. Prominence of the ventricles and sulci reflects superimposed generalized parenchymal volume loss. Additional patchy hypoattenuation in the supratentorial white matter is nonspecific but probably reflects mild chronic microvascular ischemic changes. Vascular: No hyperdense vessel or unexpected calcification. Skull: Calvarium is unremarkable. Sinuses/Orbits: Mild paranasal sinus mucosal thickening. No acute intraorbital abnormality. Other: Mastoid air cells are clear. IMPRESSION: Likely subacute right cerebellar infarction with mild reperfusion hemorrhage. No significant mass effect. Follow-up CT or MR imaging is recommended to exclude a mass. Chronic/nonemergent findings detailed above. Electronically Signed   By: Guadlupe Spanish M.D.   On: 05/26/2019 10:30   DG CHEST PORT 1 VIEW  Result Date: 05/27/2019 CLINICAL DATA:  Altered mental status. EXAM: PORTABLE CHEST 1  VIEW COMPARISON:  Radiographs 03/20/2011 and 01/23/2010. FINDINGS: 1632 hours. The heart size and mediastinal contours are stable status post median sternotomy and CABG. Left subclavian AICD lead projects to the right ventricular apex. The lungs are clear. There is no pleural effusion or pneumothorax. No acute osseous findings. IMPRESSION: Stable postoperative chest. No active cardiopulmonary process. Electronically Signed   By: Carey Bullocks M.D.   On: 05/27/2019 16:42   ECHOCARDIOGRAM COMPLETE  Result Date: 05/27/2019   ECHOCARDIOGRAM REPORT   Patient Name:   Tristan Myers Date of Exam: 05/27/2019 Medical Rec #:  [id number]     Height:       67.0 in Accession #:    [id number]    Weight:       162.6 lb Date of Birth:  [birth year]      BSA:          1.85 m Patient Age:  78 years      BP:           99/55 mmHg Patient Gender: M             HR:           68 bpm. Exam Location:  Inpatient Procedure: 2D Echo Indications:    Stroke 434.91 / I163.9  History:        Patient has prior history of Echocardiogram examinations, most                 recent 08/12/2018. CAD, Stroke, Arrythmias:Atrial Fibrillation                 and Atrial Flutter; Risk Factors:Former Smoker. Ventricular                 Tachycardia, ICD, DNR, Ischemic Cardiomyopathy.  Sonographer:    Jeryl ColumbiaJohanna Elliott Referring Phys: [email or web address] SHARON L BIBY IMPRESSIONS  1. Left ventricular ejection fraction, by visual estimation, is 25 to 30%. The left ventricle has severely decreased function. There is no left ventricular hypertrophy.  2. No obvious LV thrombus seen on this exam. Would recommend a limited contrast study to effectively rule this out given the patient's history of stroke.  3. Basal and mid inferolateral wall and apical inferior segment are abnormal.  4. Mildly dilated left ventricular internal cavity size.  5. The left ventricle demonstrates global hypokinesis.  6. Global right ventricle has normal systolic function.The right ventricular size is normal.  No increase in right ventricular wall thickness.  7. Left atrial size was normal.  8. Right atrial size was normal.  9. Presence of pericardial fat pad. 10. Mild mitral annular calcification. 11. The mitral valve is degenerative. Mild mitral valve regurgitation. 12. The tricuspid valve is grossly normal. Tricuspid valve regurgitation is trivial. 13. The aortic valve is tricuspid. Aortic valve regurgitation is not visualized. Mild to moderate aortic valve sclerosis/calcification without any evidence of aortic stenosis. 14. The pulmonic valve was grossly normal. Pulmonic valve regurgitation is not visualized. 15. TR signal is inadequate for assessing pulmonary artery systolic pressure. 16. A pacer wire is visualized in the RA and RV. 17. The inferior vena cava is normal in size with greater than 50% respiratory variability, suggesting right atrial pressure of 3 mmHg. 18. A prior study was performed on 08/12/2018. 19. No change from prior study. FINDINGS  Left Ventricle: Left ventricular ejection fraction, by visual estimation, is 25 to 30%. The left ventricle has severely decreased function. The left ventricle demonstrates global hypokinesis. The left ventricular internal cavity size was mildly dilated left ventricle. There is no left ventricular hypertrophy. No obvious LV thrombus seen on this exam. Would recommend a limited contrast study to effectively rule this out given the patient's history of stroke.  LV Wall Scoring: The basal and mid inferolateral wall and apical inferior segment are akinetic. Right Ventricle: The right ventricular size is normal. No increase in right ventricular wall thickness. Global RV systolic function is has normal systolic function. Left Atrium: Left atrial size was normal in size. Right Atrium: Right atrial size was normal in size Pericardium: There is no evidence of pericardial effusion. Presence of pericardial fat pad. Mitral Valve: The mitral valve is degenerative in appearance. Mild  mitral annular calcification. Mild mitral valve regurgitation. Tricuspid Valve: The tricuspid valve is grossly normal. Tricuspid valve regurgitation is trivial. Aortic Valve: The aortic valve is tricuspid. Aortic valve regurgitation is not visualized. Mild to moderate aortic valve sclerosis/calcification is  present, without any evidence of aortic stenosis. Pulmonic Valve: The pulmonic valve was grossly normal. Pulmonic valve regurgitation is not visualized. Pulmonic regurgitation is not visualized. Aorta: The aortic root is normal in size and structure. Venous: The inferior vena cava is normal in size with greater than 50% respiratory variability, suggesting right atrial pressure of 3 mmHg. IAS/Shunts: No atrial level shunt detected by color flow Doppler. Additional Comments: A prior study was performed on 08/12/2018. A pacer wire is visualized in the right atrium and right ventricle.  LEFT VENTRICLE PLAX 2D LVIDd:         5.90 cm  Diastology LVIDs:         5.20 cm  LV e' lateral:   6.50 cm/s LV PW:         0.90 cm  LV E/e' lateral: 7.4 LV IVS:        1.00 cm  LV e' medial:    4.75 cm/s LVOT diam:     2.10 cm  LV E/e' medial:  10.1 LV SV:         44 ml LV SV Index:   23.26 LVOT Area:     3.46 cm  RIGHT VENTRICLE TAPSE (M-mode): 1.2 cm LEFT ATRIUM             Index       RIGHT ATRIUM           Index LA diam:        4.20 cm 2.27 cm/m  RA Area:     12.70 cm LA Vol (A2C):   67.1 ml 36.23 ml/m RA Volume:   29.30 ml  15.82 ml/m LA Vol (A4C):   52.0 ml 28.08 ml/m LA Biplane Vol: 61.6 ml 33.26 ml/m   AORTA Ao Root diam: 3.70 cm MITRAL VALVE MV Area (PHT): 3.72 cm             SHUNTS MV PHT:        59.16 msec           Systemic Diam: 2.10 cm MV Decel Time: 204 msec MV E velocity: 48.00 cm/s 103 cm/s MV A velocity: 36.00 cm/s 70.3 cm/s MV E/A ratio:  1.33       1.5  Lennie Odor MD Electronically signed by Lennie Odor MD Signature Date/Time: 05/27/2019/6:07:40 PM    Final    VAS US CAROTID (at [facility] and [facility]  only)  Result Date: 05/26/2019 Carotid Arterial Duplex Study Indications:       CVA. Risk Factors:      Hypertension, hyperlipidemia, Diabetes, coronary artery                    disease, prior CVA. Comparison Study:  no prior Performing Technologist: Blanch Media RVS  Examination Guidelines: A complete evaluation includes B-mode imaging, spectral Doppler, color Doppler, and power Doppler as needed of all accessible portions of each vessel. Bilateral testing is considered an integral part of a complete examination. Limited examinations for reoccurring indications may be performed as noted.  Right Carotid Findings: +----------+--------+--------+--------+------------------+--------+             PSV cm/s EDV cm/s Stenosis Plaque Description Comments  +----------+--------+--------+--------+------------------+--------+  CCA Prox   50       10                heterogenous                 +----------+--------+--------+--------+------------------+--------+  CCA Distal 58  11                heterogenous                 +----------+--------+--------+--------+------------------+--------+  ICA Prox   120      19       1-39%    heterogenous                 +----------+--------+--------+--------+------------------+--------+  ICA Distal 80       21                                             +----------+--------+--------+--------+------------------+--------+  ECA        75                                                      +----------+--------+--------+--------+------------------+--------+ +----------+--------+-------+--------+-------------------+             PSV cm/s EDV cms Describe Arm Pressure (mmHG)  +----------+--------+-------+--------+-------------------+  Subclavian 123                                            +----------+--------+-------+--------+-------------------+ +---------+--------+--+--------+-+---------+  Vertebral PSV cm/s 40 EDV cm/s 8 Antegrade  +---------+--------+--+--------+-+---------+  Left  Carotid Findings: +----------+--------+--------+--------+------------------+---------+             PSV cm/s EDV cm/s Stenosis Plaque Description Comments   +----------+--------+--------+--------+------------------+---------+  CCA Prox   69       12                heterogenous                  +----------+--------+--------+--------+------------------+---------+  CCA Distal 75       17                heterogenous                  +----------+--------+--------+--------+------------------+---------+  ICA Prox   97       24       1-39%    heterogenous       Shadowing  +----------+--------+--------+--------+------------------+---------+  ICA Distal 71       17                                              +----------+--------+--------+--------+------------------+---------+  ECA        83                                                       +----------+--------+--------+--------+------------------+---------+ +----------+--------+--------+--------+-------------------+             PSV cm/s EDV cm/s Describe Arm Pressure (mmHG)  +----------+--------+--------+--------+-------------------+  Subclavian 109                                             +----------+--------+--------+--------+-------------------+ +---------+--------+--+--------+-+---------+  Vertebral PSV cm/s 29 EDV cm/s 5 Antegrade  +---------+--------+--+--------+-+---------+  Summary:   *See table(s) above for measurements and observations.  Electronically signed by Coral Else MD on 05/26/2019 at 5:21:33 PM.    Final      Assessment/Plan: Diagnosis: Impaired cognition and mobility following new subacute R cerebellar infarct  1. Does the need for close, 24 hr/day medical supervision in concert with the patient's rehab needs make it unreasonable for this patient to be served in a less intensive setting? Yes 2. Co-Morbidities requiring supervision/potential complications: atrial fibrillation and flutter, ischemic cardiomyopathy, history of CVA with residual  deficit, skin pressure injury.  3. Due to bladder management, bowel management, safety, skin/wound care, disease management, medication administration, pain management and patient education, does the patient require 24 hr/day rehab nursing? Yes 4. Does the patient require coordinated care of a physician, rehab nurse, therapy disciplines of PT, OT, SLP to address physical and functional deficits in the context of the above medical diagnosis(es)? Yes Addressing deficits in the following areas: balance, endurance, locomotion, strength, transferring, bowel/bladder control, bathing, dressing, feeding, grooming, toileting, cognition, speech, language, swallowing and psychosocial support 5. Can the patient actively participate in an intensive therapy program of at least 3 hrs of therapy per day at least 5 days per week? Yes 6. The potential for patient to make measurable gains while on inpatient rehab is excellent 7. Anticipated functional outcomes upon discharge from inpatient rehab are MinA with PT, min assist with OT, min assist with SLP. 8. Estimated rehab length of stay to reach the above functional goals is: 2-3 weeks 9. Anticipated discharge destination: Home 10. Overall Rehab/Functional Prognosis: good  RECOMMENDATIONS: This patient's condition is appropriate for continued rehabilitative care in the following setting: CIR Patient has agreed to participate in recommended program. N/A Note that insurance prior authorization may be required for reimbursement for recommended care.  Comment: Mr. Vogan has significant SLP, OT, PT and speech needs and could be a good candidate for CIR once lethargy improves.   Mcarthur Rossetti Angiulli, PA-C 05/28/2019   I have personally performed a face to face diagnostic evaluation, including, but not limited to relevant history and physical exam findings, of this patient and developed relevant assessment and plan.  Additionally, I have reviewed and concur with the  physician assistant's documentation above.  Sula Soda, MD

## 2019-05-28 NOTE — Progress Notes (Signed)
STROKE TEAM PROGRESS NOTE   INTERVAL HISTORY Patient is sitting up in bed.  He just returned from CT angio which showed no significant posterior circulation occlusive disease.  He has prior history of stroke in 2011 and was found to have atrial flutter he was on anticoagulation for a short time but taken off as he was noncompliant.  He has poor quality of life at baseline and barely gets her around with a walker as per his wife.  He is not a good long-term anticoagulation candidate. Vitals:   05/27/19 2330 05/27/19 2330 05/28/19 0309 05/28/19 0805  BP: (!) 117/57 (!) 117/57 (!) 116/47 (!) 159/72  Pulse: 77 77 91 78  Resp: 18 18 19 16   Temp: 98.2 F (36.8 C) 98.2 F (36.8 C) 98 F (36.7 C) 98.1 F (36.7 C)  TempSrc: Oral Oral Oral Oral  SpO2: 98% 98% 97% 98%    CBC:  Recent Labs  Lab 05/26/19 0839 05/26/19 0856 05/28/19 0417  WBC 11.3*  --  16.0*  NEUTROABS 8.4*  --   --   HGB 13.9 13.9 14.3  HCT 41.3 41.0 42.3  MCV 96.0  --  95.5  PLT 168  --  178    Basic Metabolic Panel:  Recent Labs  Lab 05/26/19 0839 05/26/19 0856 05/28/19 0417  NA 137 137 140  K 4.6 4.5 3.8  CL 101 103 105  CO2 23  --  21*  GLUCOSE 188* 188* 161*  BUN 14 15 33*  CREATININE 0.74 0.60* 1.89*  CALCIUM 9.1  --  8.9   Lipid Panel:     Component Value Date/Time   CHOL 169 05/27/2019 0456   TRIG 88 05/27/2019 0456   HDL 43 05/27/2019 0456   CHOLHDL 3.9 05/27/2019 0456   VLDL 18 05/27/2019 0456   LDLCALC 108 (H) 05/27/2019 0456   HgbA1c:  Lab Results  Component Value Date   HGBA1C 7.3 (H) 05/27/2019   Urine Drug Screen:     Component Value Date/Time   LABOPIA NONE DETECTED 05/26/2019 0839   COCAINSCRNUR NONE DETECTED 05/26/2019 0839   LABBENZ NONE DETECTED 05/26/2019 0839   AMPHETMU NONE DETECTED 05/26/2019 0839   THCU NONE DETECTED 05/26/2019 0839   LABBARB NONE DETECTED 05/26/2019 0839    Alcohol Level No results found for: ETH  IMAGING CT ANGIO HEAD W OR WO CONTRAST  Result  Date: 05/28/2019 CLINICAL DATA:  Follow-up stroke. EXAM: CT ANGIOGRAPHY HEAD AND NECK TECHNIQUE: Multidetector CT imaging of the head and neck was performed using the standard protocol during bolus administration of intravenous contrast. Multiplanar CT image reconstructions and MIPs were obtained to evaluate the vascular anatomy. Carotid stenosis measurements (when applicable) are obtained utilizing NASCET criteria, using the distal internal carotid diameter as the denominator. CONTRAST:  50mL OMNIPAQUE IOHEXOL 350 MG/ML SOLN COMPARISON:  CT head 05/26/2019 FINDINGS: CT HEAD FINDINGS Brain: Hypodensity in the right superior cerebellum similar in size. Associated high-density hemorrhage within the hypodensity, also unchanged. Findings most compatible with acute infarct with hemorrhage. Chronic infarcts in the right frontal parietal lobe. Larger area of chronic infarct in the left temporoparietal lobe. Generalized atrophy without hydrocephalus. Vascular: Negative for hyperdense vessel Skull: Negative Sinuses: Negative Orbits: Negative Review of the MIP images confirms the above findings CTA NECK FINDINGS Aortic arch: Standard branching. Imaged portion shows no evidence of aneurysm or dissection. No significant stenosis of the major arch vessel origins. Atherosclerotic calcification in the aortic arch and proximal great vessels. Right carotid system: Atherosclerotic calcification  right carotid bifurcation. Estimated 20% diameter stenosis right internal carotid artery. Calcified plaque right common carotid artery without significant stenosis. Left carotid system: Atherosclerotic calcification left carotid bifurcation. 35% diameter stenosis proximal left internal carotid artery. Vertebral arteries: Left vertebral artery dominant. Mild calcific stenosis at the origin. Right vertebral artery is patent without significant stenosis. Skeleton: Cervical spondylosis. No acute skeletal abnormality Other neck: Negative for mass  or soft tissue swelling in the neck. Upper chest: Lung apices clear bilaterally. Pacemaker pack in the left chest. Median sternotomy. Review of the MIP images confirms the above findings CTA HEAD FINDINGS Anterior circulation: Cavernous carotid widely patent bilaterally. Anterior and middle cerebral arteries patent bilaterally without stenosis. Posterior circulation: Both vertebral arteries patent to the basilar. PICA patent bilaterally. Basilar widely patent. Superior cerebellar and posterior cerebral arteries patent bilaterally. Fetal origin of the posterior cerebral artery bilaterally. Venous sinuses: Normal venous enhancement. Anatomic variants: None Review of the MIP images confirms the above findings IMPRESSION: 1. Acute infarct right superior cerebellum with hemorrhagic transformation unchanged. 2. 20% diameter stenosis proximal right internal carotid artery and 35% diameter stenosis proximal left internal carotid artery. 3. Left vertebral artery dominant. Mild calcific stenosis at the origin of the left vertebral artery. 4. No significant intracranial stenosis. Electronically Signed   By: Marlan Palau M.D.   On: 05/28/2019 12:40   CT ANGIO NECK W OR WO CONTRAST  Result Date: 05/28/2019 CLINICAL DATA:  Follow-up stroke. EXAM: CT ANGIOGRAPHY HEAD AND NECK TECHNIQUE: Multidetector CT imaging of the head and neck was performed using the standard protocol during bolus administration of intravenous contrast. Multiplanar CT image reconstructions and MIPs were obtained to evaluate the vascular anatomy. Carotid stenosis measurements (when applicable) are obtained utilizing NASCET criteria, using the distal internal carotid diameter as the denominator. CONTRAST:  50mL OMNIPAQUE IOHEXOL 350 MG/ML SOLN COMPARISON:  CT head 05/26/2019 FINDINGS: CT HEAD FINDINGS Brain: Hypodensity in the right superior cerebellum similar in size. Associated high-density hemorrhage within the hypodensity, also unchanged. Findings  most compatible with acute infarct with hemorrhage. Chronic infarcts in the right frontal parietal lobe. Larger area of chronic infarct in the left temporoparietal lobe. Generalized atrophy without hydrocephalus. Vascular: Negative for hyperdense vessel Skull: Negative Sinuses: Negative Orbits: Negative Review of the MIP images confirms the above findings CTA NECK FINDINGS Aortic arch: Standard branching. Imaged portion shows no evidence of aneurysm or dissection. No significant stenosis of the major arch vessel origins. Atherosclerotic calcification in the aortic arch and proximal great vessels. Right carotid system: Atherosclerotic calcification right carotid bifurcation. Estimated 20% diameter stenosis right internal carotid artery. Calcified plaque right common carotid artery without significant stenosis. Left carotid system: Atherosclerotic calcification left carotid bifurcation. 35% diameter stenosis proximal left internal carotid artery. Vertebral arteries: Left vertebral artery dominant. Mild calcific stenosis at the origin. Right vertebral artery is patent without significant stenosis. Skeleton: Cervical spondylosis. No acute skeletal abnormality Other neck: Negative for mass or soft tissue swelling in the neck. Upper chest: Lung apices clear bilaterally. Pacemaker pack in the left chest. Median sternotomy. Review of the MIP images confirms the above findings CTA HEAD FINDINGS Anterior circulation: Cavernous carotid widely patent bilaterally. Anterior and middle cerebral arteries patent bilaterally without stenosis. Posterior circulation: Both vertebral arteries patent to the basilar. PICA patent bilaterally. Basilar widely patent. Superior cerebellar and posterior cerebral arteries patent bilaterally. Fetal origin of the posterior cerebral artery bilaterally. Venous sinuses: Normal venous enhancement. Anatomic variants: None Review of the MIP images confirms the above findings IMPRESSION: 1.  Acute infarct  right superior cerebellum with hemorrhagic transformation unchanged. 2. 20% diameter stenosis proximal right internal carotid artery and 35% diameter stenosis proximal left internal carotid artery. 3. Left vertebral artery dominant. Mild calcific stenosis at the origin of the left vertebral artery. 4. No significant intracranial stenosis. Electronically Signed   By: Franchot Gallo M.D.   On: 05/28/2019 12:40   DG CHEST PORT 1 VIEW  Result Date: 05/27/2019 CLINICAL DATA:  Altered mental status. EXAM: PORTABLE CHEST 1 VIEW COMPARISON:  Radiographs 03/20/2011 and 01/23/2010. FINDINGS: 1632 hours. The heart size and mediastinal contours are stable status post median sternotomy and CABG. Left subclavian AICD lead projects to the right ventricular apex. The lungs are clear. There is no pleural effusion or pneumothorax. No acute osseous findings. IMPRESSION: Stable postoperative chest. No active cardiopulmonary process. Electronically Signed   By: Richardean Sale M.D.   On: 05/27/2019 16:42   ECHOCARDIOGRAM COMPLETE  Result Date: 05/27/2019   ECHOCARDIOGRAM REPORT   Patient Name:   STILLMAN BUENGER Date of Exam: 05/27/2019 Medical Rec #:  [id number]     Height:       67.0 in Accession #:    [id number]    Weight:       162.6 lb Date of Birth:  08-Mar-1941      BSA:          1.85 m Patient Age:    53 years      BP:           99/55 mmHg Patient Gender: M             HR:           68 bpm. Exam Location:  Inpatient Procedure: 2D Echo Indications:    Stroke 434.91 / I163.9  History:        Patient has prior history of Echocardiogram examinations, most                 recent 08/12/2018. CAD, Stroke, Arrythmias:Atrial Fibrillation                 and Atrial Flutter; Risk Factors:Former Smoker. Ventricular                 Tachycardia, ICD, DNR, Ischemic Cardiomyopathy.  Sonographer:    Leavy Cella Referring Phys: [place]  1. Left ventricular ejection fraction, by visual estimation, is 25 to 30%. The left  ventricle has severely decreased function. There is no left ventricular hypertrophy.  2. No obvious LV thrombus seen on this exam. Would recommend a limited contrast study to effectively rule this out given the patient's history of stroke.  3. Basal and mid inferolateral wall and apical inferior segment are abnormal.  4. Mildly dilated left ventricular internal cavity size.  5. The left ventricle demonstrates global hypokinesis.  6. Global right ventricle has normal systolic function.The right ventricular size is normal. No increase in right ventricular wall thickness.  7. Left atrial size was normal.  8. Right atrial size was normal.  9. Presence of pericardial fat pad. 10. Mild mitral annular calcification. 11. The mitral valve is degenerative. Mild mitral valve regurgitation. 12. The tricuspid valve is grossly normal. Tricuspid valve regurgitation is trivial. 13. The aortic valve is tricuspid. Aortic valve regurgitation is not visualized. Mild to moderate aortic valve sclerosis/calcification without any evidence of aortic stenosis. 14. The pulmonic valve was grossly normal. Pulmonic valve regurgitation is not visualized. 15. TR signal is inadequate for assessing  pulmonary artery systolic pressure. 16. A pacer wire is visualized in the RA and RV. 17. The inferior vena cava is normal in size with greater than 50% respiratory variability, suggesting right atrial pressure of 3 mmHg. 18. A prior study was performed on 08/12/2018. 19. No change from prior study. FINDINGS  Left Ventricle: Left ventricular ejection fraction, by visual estimation, is 25 to 30%. The left ventricle has severely decreased function. The left ventricle demonstrates global hypokinesis. The left ventricular internal cavity size was mildly dilated left ventricle. There is no left ventricular hypertrophy. No obvious LV thrombus seen on this exam. Would recommend a limited contrast study to effectively rule this out given the patient's history of  stroke.  LV Wall Scoring: The basal and mid inferolateral wall and apical inferior segment are akinetic. Right Ventricle: The right ventricular size is normal. No increase in right ventricular wall thickness. Global RV systolic function is has normal systolic function. Left Atrium: Left atrial size was normal in size. Right Atrium: Right atrial size was normal in size Pericardium: There is no evidence of pericardial effusion. Presence of pericardial fat pad. Mitral Valve: The mitral valve is degenerative in appearance. Mild mitral annular calcification. Mild mitral valve regurgitation. Tricuspid Valve: The tricuspid valve is grossly normal. Tricuspid valve regurgitation is trivial. Aortic Valve: The aortic valve is tricuspid. Aortic valve regurgitation is not visualized. Mild to moderate aortic valve sclerosis/calcification is present, without any evidence of aortic stenosis. Pulmonic Valve: The pulmonic valve was grossly normal. Pulmonic valve regurgitation is not visualized. Pulmonic regurgitation is not visualized. Aorta: The aortic root is normal in size and structure. Venous: The inferior vena cava is normal in size with greater than 50% respiratory variability, suggesting right atrial pressure of 3 mmHg. IAS/Shunts: No atrial level shunt detected by color flow Doppler. Additional Comments: A prior study was performed on 08/12/2018. A pacer wire is visualized in the right atrium and right ventricle.  LEFT VENTRICLE PLAX 2D LVIDd:         5.90 cm  Diastology LVIDs:         5.20 cm  LV e' lateral:   6.50 cm/s LV PW:         0.90 cm  LV E/e' lateral: 7.4 LV IVS:        1.00 cm  LV e' medial:    4.75 cm/s LVOT diam:     2.10 cm  LV E/e' medial:  10.1 LV SV:         44 ml LV SV Index:   23.26 LVOT Area:     3.46 cm  RIGHT VENTRICLE TAPSE (M-mode): 1.2 cm LEFT ATRIUM             Index       RIGHT ATRIUM           Index LA diam:        4.20 cm 2.27 cm/m  RA Area:     12.70 cm LA Vol (A2C):   67.1 ml 36.23 ml/m RA  Volume:   29.30 ml  15.82 ml/m LA Vol (A4C):   52.0 ml 28.08 ml/m LA Biplane Vol: 61.6 ml 33.26 ml/m   AORTA Ao Root diam: 3.70 cm MITRAL VALVE MV Area (PHT): 3.72 cm             SHUNTS MV PHT:        59.16 msec           Systemic Diam: 2.10 cm MV Decel Time:  204 msec MV E velocity: 48.00 cm/s 103 cm/s MV A velocity: 36.00 cm/s 70.3 cm/s MV E/A ratio:  1.33       1.5  Lennie Odor MD Electronically signed by Lennie Odor MD Signature Date/Time: 05/27/2019/6:07:40 PM    Final    VAS US CAROTID (at [facility] and [facility] only)  Result Date: 05/26/2019 Carotid Arterial Duplex Study Indications:       CVA. Risk Factors:      Hypertension, hyperlipidemia, Diabetes, coronary artery                    disease, prior CVA. Comparison Study:  no prior Performing Technologist: Blanch Media RVS  Examination Guidelines: A complete evaluation includes B-mode imaging, spectral Doppler, color Doppler, and power Doppler as needed of all accessible portions of each vessel. Bilateral testing is considered an integral part of a complete examination. Limited examinations for reoccurring indications may be performed as noted.  Right Carotid Findings: +----------+--------+--------+--------+------------------+--------+           PSV cm/sEDV cm/sStenosisPlaque DescriptionComments +----------+--------+--------+--------+------------------+--------+ CCA Prox  50      10              heterogenous               +----------+--------+--------+--------+------------------+--------+ CCA Distal58      11              heterogenous               +----------+--------+--------+--------+------------------+--------+ ICA Prox  120     19      1-39%   heterogenous               +----------+--------+--------+--------+------------------+--------+ ICA Distal80      21                                         +----------+--------+--------+--------+------------------+--------+ ECA       75                                                  +----------+--------+--------+--------+------------------+--------+ +----------+--------+-------+--------+-------------------+           PSV cm/sEDV cmsDescribeArm Pressure (mmHG) +----------+--------+-------+--------+-------------------+ [id number]                                        +----------+--------+-------+--------+-------------------+ +---------+--------+--+--------+-+---------+ VertebralPSV cm/s40EDV cm/s8Antegrade +---------+--------+--+--------+-+---------+  Left Carotid Findings: +----------+--------+--------+--------+------------------+---------+           PSV cm/sEDV cm/sStenosisPlaque DescriptionComments  +----------+--------+--------+--------+------------------+---------+ CCA Prox  69      12              heterogenous                +----------+--------+--------+--------+------------------+---------+ CCA Distal75      17              heterogenous                +----------+--------+--------+--------+------------------+---------+ ICA Prox  97      24      1-39%   heterogenous      Shadowing +----------+--------+--------+--------+------------------+---------+ ICA Distal71      17                                          +----------+--------+--------+--------+------------------+---------+  ECA       83                                                  +----------+--------+--------+--------+------------------+---------+ +----------+--------+--------+--------+-------------------+           PSV cm/sEDV cm/sDescribeArm Pressure (mmHG) +----------+--------+--------+--------+-------------------+ Subclavian109                                         +----------+--------+--------+--------+-------------------+ +---------+--------+--+--------+-+---------+ VertebralPSV cm/s29EDV cm/s5Antegrade +---------+--------+--+--------+-+---------+  Summary:   *See table(s) above for measurements and observations.  Electronically  signed by Coral Else MD on 05/26/2019 at 5:21:33 PM.    Final     PHYSICAL EXAM Frail elderly male not in distress. . Afebrile. Head is nontraumatic. Neck is supple without bruit.    Cardiac exam no murmur or gallop. Lungs are clear to auscultation. Distal pulses are well felt. Neurological Exam : Patient is awake alert but disoriented and confused.  Can answer his name but cannot tell me who the president is or what his today's date.  Mild dysarthria.  Follows only simple midline commands.  .  Extraocular movements appear full range.  He blinks to threat bilaterally.  Face appears symmetric.  Tongue midline.  He is able to move all 4 extremities against gravity but is not cooperative for detailed muscle strength or coordination testing.  Deep tendon flexes symmetric.  Plantars are downgoing.  Gait not tested.   ASSESSMENT/PLAN Mr. JOSAIAH MUHAMMED is a 78 y.o. male with history of AF on warfarin in the past but not now, CVA, CAD s/p CABG, DM, HLD, PVD, ICD placement presenting from home with 4 day hx of altered mental status with speech and gait changes.   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   CTA head & neck no large vessel stenosis or occlusion  Carotid Doppler bilateral 1-39% carotid stenosis  2D Echo ejection fraction 25 to 30%.  No definite clot noted.    LDL 108  HgbA1c 7.3  SCDs for VTE prophylaxis  aspirin 81 mg daily prior to admission, now on No antithrombotic given petechial hemorrhage post infarct   Therapy recommendations:  pending   Disposition:  pending (lived home PTA)  Agitation  Received 3 doses haldol during the night  Now difficult to waken  Stat CTA head and neck ordered but too agitated to get  Atrial Fibrillation  Home anticoagulation:  none, previously on warfarin   Not on AC at this time d/t acute infarct  w/ petechial hemorrhage currently  Followed by Dr. Graciela Husbands    Hx stroke/TIA  01/2010 - L MCA infarct w/ hemorrhagic transformation in setting of INR 4.16. refused to go to f/u MD or therapy appts but did take meds as prescribed after stroke in July 2011. 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 Permissive hypertension (OK if < 220/120)  but gradually normalize in 5-7 days . Long-term BP goal normotensive  Hyperlipidemia  Home meds:  crestor 10, resumed in hospital  LDL 108, goal < 70  Continue statin at discharge  Diabetes type II Uncontrolled  HgbA1c 7.3, goal < 7.0  CBGs  SSI  Dysphagia . Secondary to stroke . NPO . Speech on board   Other Stroke Risk Factors  Advanced age  Former Cigarette smoker  Coronary artery disease s/p CABG, ICD implant in 1997  Hx DVT LUE s/p ICD placement   PVD  Hospital day # 2 Patient has presented with a what appears to be embolic right cerebellar infarct with CT scan showing some previous old cortical infarcts as well likely of cardioembolic etiology given history of atrial fibrillation/flutter.  CT angiogram of brain and neck, shows no significant posterior circulation occlusive disease and echocardiogram shows diminished ejection fraction but no definite clot..  Patient is clearly a poor candidate for long-term anticoagulation given his prior history of noncompliance, fall risk and cognitive impairment.  Continue aspirin for stroke prevention  given hemorrhagic conversion mobilize out of bed therapy and rehab consults.  Discussed with Dr. Dayna Barker greater than 50% time during this 25-minute visit was spent on coordination of care and discussion with care team.  Follow-up as an  outpatient stroke clinic in 6 weeks.  Stroke team will sign off.  Kindly call for questions. Delia Heady, MD To contact Stroke Continuity provider, please refer to [email or web address]. After hours, contact General Neurology

## 2019-05-28 NOTE — Evaluation (Signed)
Clinical/Bedside Swallow Evaluation Patient Details  Name: Tristan Myers MRN: [id number] Date of Birth: [birth year]  Today's Date: 05/28/2019 Time: SLP Start Time (ACUTE ONLY): 0840 SLP Stop Time (ACUTE ONLY): 0850 SLP Time Calculation (min) (ACUTE ONLY): 10 min  Past Medical History:  Past Medical History:  Diagnosis Date  . Atrial fibrillation and flutter (HCC)    on coumadin  . CAD (coronary artery disease)    s/p ICD implant in 1997  . CVA (cerebral vascular accident) ([place]) 12/2009   LEFT PARIETAL WITH APHASIA / APRAXIA  . 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 Diabetes mellitus, type 2 ([place])   . DVT (deep venous thrombosis) ([place])    OF LEFT ARM FOLLOWING ICD PLACEMENT  . Glaucoma   . Hyperlipidemia   . ICD (implantable cardioverter-defibrillator)[employer]   . 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 Obesity   . PVD (peripheral vascular disease) ([place])   . S/P CABG x 1   . Stroke [facility])    left brain CVA with aphasia 01/03/10  . Ventricular tachycardia (HCC)    Hx of it   Past Surgical History:  Past Surgical History:  Procedure Laterality Date  . AORTIC VALVE REPLACEMENT (AVR)/CORONARY ARTERY BYPASS GRAFTING (CABG)     W LIMA TO LAD, SVG TO OM1, AND SVG TO RCA 09/03/1995 DR. [id number], AICD IMPLANT MARCH 1997, [place] REPLACEMENT 12/2001  . CARDIAC CATHETERIZATION Left 08/1995  . CATARACT EXTRACTION     OD   HPI:  Tristan Myers is a 78 y.o. 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   Assessment / Plan / Recommendation Clinical Impression  Although pt passed [place], order placed for bedside swallow evaluation d/t pt's altered  mental status. Pt politely refused any more of his breakfast but was agreeable to graham crackers and water. Pt consumed 6 oz thin liquids via straw without stopping and no overt s/s of aspiration. Despite some oral motor deficits, pt consumed graham cracker without any oral residue, timely oral phase and no adverse deficits noted. Continue to recommend currently prescribed diet. Nursing aware of recommendation, ST to sign off.  SLP Visit Diagnosis: Dysphagia, unspecified (R13.10)    Aspiration Risk  No limitations    Diet Recommendation Regular;Thin liquid   Liquid Administration via: Cup;Straw Medication Administration: Whole meds with liquid Supervision: Patient able to self feed Compensations: Minimize environmental distractions;Slow rate;Small sips/bites Postural Changes: Seated upright at 90 degrees    Other  Recommendations Oral Care Recommendations: Oral care BID   Follow up Recommendations None      Frequency and Duration   N/A         Prognosis   N/A     Swallow Study   General Date of Onset: 05/26/19 HPI: Tristan Myers is a 78 y.o. 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 Type of Study: Bedside Swallow Evaluation Previous Swallow Assessment: none in chart Diet Prior to this Study: Regular;Thin liquids Temperature Spikes Noted: No Respiratory Status: Room air History  of Recent Intubation: No Behavior/Cognition: Alert;Cooperative;Pleasant mood Oral Cavity Assessment: Within Functional Limits Oral Care Completed by SLP: No Oral Cavity - Dentition: Poor condition;Missing dentition Vision: Functional for self-feeding Self-Feeding Abilities: Able to feed self Patient Positioning: Upright in bed Baseline Vocal Quality: Normal Volitional Cough:  Cognitively unable to elicit Volitional Swallow: Unable to elicit    Oral/Motor/Sensory Function Overall Oral Motor/Sensory Function: Moderate impairment Facial ROM: Reduced left Facial Symmetry: Abnormal symmetry left Facial Strength: Within Functional Limits Facial Sensation: Within Functional Limits Lingual ROM: Within Functional Limits Lingual Symmetry: Within Functional Limits Lingual Strength: Within Functional Limits Lingual Sensation: Within Functional Limits Mandible: Within Functional Limits   Ice Chips Ice chips: Not tested   Thin Liquid Thin Liquid: Within functional limits Presentation: Self Fed;Straw    Nectar Thick Nectar Thick Liquid: Not tested   Honey Thick Honey Thick Liquid: Not tested   Puree Puree: Not tested   Solid     Solid: Within functional limits Presentation: Self Fed      Teresea Donley 05/28/2019,1:13 PM

## 2019-05-28 NOTE — Evaluation (Signed)
Speech Language Pathology Evaluation Patient Details Name: Tristan Myers MRN: [id number] DOB: 29-Dec-1940 Today's Date: 05/28/2019 Time: 0850-0905 SLP Time Calculation (min) (ACUTE ONLY): 15 min  Problem List:  Patient Active Problem List   Diagnosis Date Noted  . DNR (do not resuscitate) present on admission 05/27/2019  . Pressure injury of skin 05/27/2019  . History of hemorrhagic cerebrovascular accident (CVA) with residual deficit 05/26/2019  . CVA (cerebral vascular accident) (HCC) 05/26/2019  . CVA (cerebrovascular accident) (HCC) 05/26/2019  . 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 ICD (implantable cardioverter-defibrillator)[employer]   . Atrial fibrillation and flutter (HCC)   . Ventricular tachycardia (HCC)   . CAD (coronary artery disease)   . Ischemic cardiomyopathy 01/02/2011  . 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 VENTRICULAR TACHYCARDIA 04/18/2010   Past Medical History:  Past Medical History:  Diagnosis Date  . Atrial fibrillation and flutter (HCC)    on coumadin  . CAD (coronary artery disease)    s/p ICD implant in 1997  . CVA (cerebral vascular accident) (HCC) 12/2009   LEFT PARIETAL WITH APHASIA / APRAXIA  . 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 Diabetes mellitus, type 2 (HCC)   . DVT (deep venous thrombosis) (HCC)    OF LEFT ARM FOLLOWING ICD PLACEMENT  . Glaucoma   . Hyperlipidemia   . ICD (implantable cardioverter-defibrillator)[employer]   . 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 Obesity   . PVD (peripheral vascular disease) (HCC)   . S/P CABG x 1   . 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 Ventricular tachycardia  (HCC)    Hx of it   Past Surgical History:  Past Surgical History:  Procedure Laterality Date  . AORTIC VALVE REPLACEMENT (AVR)/CORONARY ARTERY BYPASS GRAFTING (CABG)     W LIMA TO LAD, SVG TO OM1, AND SVG TO RCA 09/03/1995 DR. [id number], AICD IMPLANT MARCH 1997, AICD GENERATOR REPLACEMENT 12/2001  . CARDIAC CATHETERIZATION Left 08/1995  . CATARACT EXTRACTION     OD   HPI:  Tristan Myers is a 77 y.o. 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   Assessment / Plan / Recommendation Clinical Impression  At baseline pt performed physical tasks such as cutting work. She wife explains that pt had been experiencing steady cognitive decline for last "3 months or so" and she had to "take over their money" because he had "gotten it into such bad shape." She was also responsible for ensuring that he took his medicine as prescribed, managing his diabetes and he had frequently began "living in the past" and talking about "people who were dead." Per her report, pt had residual speech intelligibility deficits from CVA in 2013. At baseline, pt's speech intelligibility fluctuated with wife only able to intermittently understand. He was not able to say his full name, would not have been able to call 911 and community dwellers were not able to understand him. When she spoke to him on the phone earlier today, he sounds "the same." At this time pt's speech is largely unintelligible but does have moments  when listener can understand occasional words. With cues, pt is not able to make speech more intelligible and appears unaware of decreased intelligibility. Pt able to sustain attention to basic tasks (such as self-feeding). He is able to follow 1 step directions and answer yes/no question accurately related to  orientation information. Pt did speak about past events as he started telling a funny story that happened at the [facility] as if it had just happened last weekend (but pt hasn't work there in many years). Currently pt's cognitive linguistic abilities appear to be at baseline. ST to sign off.     SLP Assessment  SLP Recommendation/Assessment: Patient does not need any further Speech Lanaguage Pathology Services SLP Visit Diagnosis: Dysarthria and anarthria (R47.1)    Follow Up Recommendations  None    Frequency and Duration   N/A        SLP Evaluation Cognition  Overall Cognitive Status: History of cognitive impairments - at baseline Arousal/Alertness: Awake/alert Orientation Level: Oriented to person;Oriented to place;Oriented to situation(pt able to answer yes/no questions related to above accurate)       Comprehension  Auditory Comprehension Overall Auditory Comprehension: Impaired at baseline    Expression Expression Primary Mode of Expression: Verbal Verbal Expression Overall Verbal Expression: Impaired at baseline   Oral / Motor  Oral Motor/Sensory Function Overall Oral Motor/Sensory Function: Moderate impairment Facial ROM: Reduced left Facial Symmetry: Abnormal symmetry left Facial Strength: Within Functional Limits Facial Sensation: Within Functional Limits Lingual ROM: Within Functional Limits Lingual Symmetry: Within Functional Limits Lingual Strength: Within Functional Limits Lingual Sensation: Within Functional Limits Mandible: Within Functional Limits Motor Speech Overall Motor Speech: Impaired at baseline Intelligibility: Intelligibility reduced Word: 0-24% accurate Phrase: 0-24% accurate Sentence: 0-24% accurate Conversation: 0-24% accurate Motor Planning: Impaired(at baseline)   GO                    Tristan Myers 05/28/2019, 1:05 PM

## 2019-05-28 NOTE — Evaluation (Signed)
Occupational Therapy Evaluation Patient Details Name: Tristan Myers MRN: [id number] DOB: 1941-02-24 Today's Date: 05/28/2019    History of Present Illness Tristan Myers is a 78 y.o. 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   Clinical Impression   Pt admitted with above. He demonstrates the below listed deficits and will benefit from continued OT to maximize safety and independence with BADLs.  Pt presents to OT with impaired cognition, decreased balance, decreased activity tolerance, and generalized weakness.  He currently requires mod A, overall for ADLs and mod A for functional transfers.  Per chart, he lived with wife at home.  Feel he would benefit from CIR level therapies to allow him to achieve min guard to min A level with ADLs.       Follow Up Recommendations  CIR;Supervision/Assistance - 24 hour    Equipment Recommendations  3 in 1 bedside commode    Recommendations for Other Services Rehab consult     Precautions / Restrictions Precautions Precautions: Fall Restrictions Weight Bearing Restrictions: No      Mobility Bed Mobility Overal bed mobility: Needs Assistance Bed Mobility: Supine to Sit     Supine to sit: Min assist Sit to supine: Min guard   General bed mobility comments: Pt requires assist to initiate movement and to lift trunk   Transfers Overall transfer level: Needs assistance Equipment used: 1 person hand held assist Transfers: Sit to/from [facility] Sit to Stand: Mod assist;+2 safety/equipment Stand pivot transfers: Mod assist       General transfer comment: Pt able to move sit to stand x 2 with min A, but tries to sit prematurely     Balance Overall balance assessment: Needs assistance Sitting-balance  support: Feet supported Sitting balance-Leahy Scale: Fair Sitting balance - Comments: able to don socks with close min guard assist EOB    Standing balance support: Bilateral upper extremity supported Standing balance-Leahy Scale: Poor Standing balance comment: posterior bias                           ADL either performed or assessed with clinical judgement   ADL Overall ADL's : Needs assistance/impaired Eating/Feeding: Minimal assistance;Sitting   Grooming: Wash/dry hands;Wash/dry face;Oral care;Brushing hair;Minimal assistance;Sitting   Upper Body Bathing: Moderate assistance;Sitting   Lower Body Bathing: Moderate assistance;Sit to/from stand   Upper Body Dressing : Moderate assistance;Sitting   Lower Body Dressing: Moderate assistance;Sit to/from stand Lower Body Dressing Details (indicate cue type and reason): able to don socks with min A  Toilet Transfer: Moderate assistance;Stand-pivot;BSC Toilet Transfer Details (indicate cue type and reason): Pt moves sit to stand with min A, and requires min A initially to transfer to chair, however, he tends to sit prematurely requiring mod A to complete transfer  Toileting- Clothing Manipulation and Hygiene: Moderate assistance;Sit to/from stand       Functional mobility during ADLs: Moderate assistance       Vision   Additional Comments: Pt able to read clock on the wall with min cues to attend to task      Perception Perception Perception Tested?: Yes   Praxis      Pertinent Vitals/Pain Pain Assessment: Faces Faces Pain Scale: No hurt     Hand Dominance Right  Extremity/Trunk Assessment Upper Extremity Assessment Upper Extremity Assessment: Generalized weakness   Lower Extremity Assessment Lower Extremity Assessment: Generalized weakness   Cervical / Trunk Assessment Cervical / Trunk Assessment: Other exceptions Cervical / Trunk Exceptions: cervical stiffness keeping head tilted L and extended    Communication Communication Communication: Expressive difficulties;Other (comment)(dyarthria )   Cognition Arousal/Alertness: Lethargic Behavior During Therapy: Impulsive Overall Cognitive Status: No family/caregiver present to determine baseline cognitive functioning Area of Impairment: Attention                 Orientation Level: Disoriented to;Situation;Time Current Attention Level: Sustained   Following Commands: Follows one step commands inconsistently;Follows one step commands with increased time Safety/Judgement: Decreased awareness of safety;Decreased awareness of deficits     General Comments: Pt follows one step commands with multi modal cues.  He frequently self distracts.  He is able to sustain attention to familiar ADL tasks with min cues    General Comments  sitter present     Exercises     Shoulder Instructions      Home Living Family/patient expects to be discharged to:: Inpatient rehab                                    Lives With: Spouse    Prior Functioning/Environment          Comments: pt unable to provide input.  Per chart review spouse was assisting with IADLs.         OT Problem List: Decreased strength;Decreased activity tolerance;Impaired balance (sitting and/or standing);Decreased coordination;Decreased cognition;Decreased safety awareness      OT Treatment/Interventions: Self-care/ADL training;Neuromuscular education;DME and/or AE instruction;Therapeutic activities;Cognitive remediation/compensation;Patient/family education;Balance training    OT Goals(Current goals can be found in the care plan section) Acute Rehab OT Goals Patient Stated Goal: unable to state OT Goal Formulation: Patient unable to participate in goal setting Time For Goal Achievement: 06/11/19 Potential to Achieve Goals: Good(for stated goals ) ADL Goals Pt Will Perform Grooming: with min assist;standing Pt Will Perform Upper Body Bathing: with  set-up;with supervision;sitting Pt Will Perform Lower Body Bathing: with min assist;sit to/from stand Pt Will Perform Upper Body Dressing: with set-up;with supervision;sitting Pt Will Perform Lower Body Dressing: with min assist;sit to/from stand Pt Will Transfer to Toilet: with min assist;ambulating;regular height toilet;bedside commode;grab bars Pt Will Perform Toileting - Clothing Manipulation and hygiene: with min assist;sit to/from stand  OT Frequency: Min 2X/week   Barriers to D/C: Decreased caregiver support  unsure if spouse can provide necessary level of assist        Co-evaluation              AM-PAC OT "6 Clicks" Daily Activity     Outcome Measure Help from another person eating meals?: A Little Help from another person taking care of personal grooming?: A Little Help from another person toileting, which includes using toliet, bedpan, or urinal?: A Lot Help from another person bathing (including washing, rinsing, drying)?: A Lot Help from another person to put on and taking off regular upper body clothing?: A Lot Help from another person to put on and taking off regular lower body clothing?: A Lot 6 Click Score: 14   End of Session Equipment Utilized During Treatment: Gait belt Nurse Communication: Mobility status  Activity Tolerance: Patient tolerated treatment well Patient left: in chair;with call bell/phone within reach;with chair alarm set;with nursing/sitter in room  OT Visit Diagnosis: Unsteadiness  on feet (R26.81);Cognitive communication deficit (R41.841) Symptoms and signs involving cognitive functions: Cerebral infarction                Time: 5277-8242 OT Time Calculation (min): 29 min Charges:  OT General Charges $OT Visit: 1 Visit OT Evaluation $OT Eval Moderate Complexity: 1 Mod OT Treatments $Self Care/Home Management : 8-22 mins  Jeani Hawking, OTR/L Acute Rehabilitation Services Pager [phone] Office [phone]   Jeani Hawking  M 05/28/2019, 5:52 PM

## 2019-05-28 NOTE — Progress Notes (Addendum)
PROGRESS NOTE    Tristan Myers  ZOX:[id number] DOB: 28-Oct-1940 DOA: 05/26/2019 PCP: Kaleen Mask, MD   Brief Narrative: 78 year old male with history of AAA fever on warfarin previously but not currently, history of previous strokes left MCA embolic stroke in 2011 was started on Coumadin but refused to follow-up and admitted a month later with hemorrhagic conversion-and, and was discontinued, was kept on aspirin, history of CAD status post CABG, DM, HLD, PVD, history of ICD placement presented from home with 4-day history of altered mental status, speech and gait abnormalities.  Subjective:   Alert, awake speech dysarthric able to say his ? Name, earlier per RN telling his date of birth very dysarthric is present difficult to comprehend.   Sitter at the bedside.  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  Infarct embolic secondary to known A. fib not on anticoagulation.  Reviewed CT head CT head and neck carotid Dopplers, 2D echo. Unable to get MRI due to AICD. LDL 108 A1c 7.3.Continue close neuro monitoring one-to-one.  Home asa on hold due to petechial hemorrhage.  cont PT OT and speech.  Await for further neurology recommendation Is DNR, at risk for decompensation monito.  12/10 CTA head.neck-"acute infarct right superior cerebellum with hemorrhagic transformation unchanged. 20% diameter stenosis proximal right internal carotid artery and 35% diameter stenosis proximal left internal carotid artery".  Dysphagia/dysarthria/cognitive impairment secondary to stroke, keep n.p.o. speech following.  Denies any speech issues seeing ever since his wife mentioned that patient has had decline in past 3 months and they have taken over his finances and medication.  Speech following.  Acute metabolic encephalopathy in the setting of stroke with residual patient confusion:  Continue supportive care, fall precaution, one-to-one.Continue sedatives as needed.  AKI with creatinine 1.8, on admission 0.7.Suspect multifactorial with IV contrast use, decreased oral intake.  Has EF of 25 to 30% avoid aggressive hydration.  Continue gentle IV normal saline.  Watch for fluid overload.monitor chemistry panel and urine output. Recent Labs  Lab 05/26/19 0839 05/26/19 0856 05/28/19 0417  BUN 14 15 33*  CREATININE 0.74 0.60* 1.89*    History of stroke x2: 01/2010 - L MCA infarct w/ hemorrhagic transformation in setting of INR 4.16. refused to go to f/u MD or therapy appts but did take meds as prescribed after stroke in July 2011. 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  Cont metoprolol and sotalol. Similarly reduced EF IN ECHO  Hypertension:allow permissive hypertension continue as needed medication continue as needed meds for BP >22/120.  Atrial fibrillation and flutter:Currently normal sinus rhythm.  Unable to anticoagulate due to hemorrhage. Followed by cardiology D Graciela Husbands.  Diabetes mellitus type 2 uncontrolled A1c 7.3, goal less than 7: Blood sugar in 180s this morning.  Continue sliding scale insulin,  Lantus and monitor Accu-Chek. Uses 70/30 novolog: at home uses 10 units if 200,  And if>250 uses 20 units, uses twice a day.  HLD: On crestor 10 mg at home,cont, ldl goal <70, now at 108  DNR: present on admission  Incontinence of urine/bowel for 3 months  And mostly  bedbound barely able get to bathroom with walker.  Pressure Ulcer: Pressure Injury 05/27/19 Buttocks Right;Left Stage II -  Partial thickness loss of dermis presenting as a  shallow open ulcer with a red, pink wound bed without slough. 2 dime size open areas with no drainage noted (Active)  05/27/19 1623  Location: Buttocks  Location Orientation: Right;Left  Staging: Stage II -  Partial thickness loss of dermis presenting as a shallow open ulcer with a red, pink wound bed without slough.  Wound Description (Comments): 2 dime size open areas with no drainage noted  Present on Admission: Yes   There is no height or weight on file to calculate BMI.   DVT prophylaxis: SCD no chemical prophylaxis due to hemorrhagic conversion risk Code Status: DNR Family Communication: plan of care discussed with, will update wife.Able to reach wife and updated. [phone] again today. Wife reports he understands but can't respond fast since 2013. Disposition Plan: Remains inpatient pending clinical improvement. Will need CIR.  Consultants: Neurology. Procedures: MRI brain, carotid ultrasound,   CT head CTA head and neck 1. Acute infarct right superior cerebellum with hemorrhagic transformation unchanged. 2. 20% diameter stenosis proximal right internal carotid artery and 35% diameter stenosis proximal left internal carotid artery. 3. Left vertebral artery dominant. Mild calcific stenosis at the origin of the left vertebral artery. 4. No significant intracranial stenosis.  TTE 05/27/19   1. Left ventricular ejection fraction, by visual estimation, is 25 to 30%. The left ventricle has severely decreased function. There is no left ventricular hypertrophy.  2. No obvious LV thrombus seen on this exam. Would recommend a limited contrast study to effectively rule this out given the patient's history of stroke.  3. Basal and mid inferolateral wall and apical inferior segment are abnormal.  4. Mildly dilated left ventricular internal cavity size.  5. The left ventricle demonstrates global hypokinesis.  6. Global right ventricle has normal systolic function.The right ventricular  size is normal. No increase in right ventricular wall thickness.  7. Left atrial size was normal.  8. Right atrial size was normal.  9. Presence of pericardial fat pad. 10. Mild mitral annular calcification. 11. The mitral valve is degenerative. Mild mitral valve regurgitation. 12. The tricuspid valve is grossly normal. Tricuspid valve regurgitation is trivial. 13. The aortic valve is tricuspid. Aortic valve regurgitation is not visualized. Mild to moderate aortic valve sclerosis/calcification without any evidence of aortic stenosis. 14. The pulmonic valve was grossly normal. Pulmonic valve regurgitation is not visualized. 15. TR signal is inadequate for assessing pulmonary artery systolic pressure. 16. A pacer wire is visualized in the RA and RV. 17. The inferior vena cava is normal in size with greater than 50% respiratory variability, suggesting right atrial pressure of 3 mmHg. 18. A prior study was performed on 08/12/2018. 19. No change from prior study. TTE Microbiology: COVID-19 negative  Antimicrobials: Anti-infectives (From admission, onward)   None       Objective: Vitals:   05/27/19 2330 05/27/19 2330 05/28/19 0309 05/28/19 0805  BP: (!) 117/57 (!) 117/57 (!) 116/47 (!) 159/72  Pulse: 77 77 91 78  Resp: Temp: 98.2 F (36.8 C) 98.2 F (36.8 C) 98 F (36.7 C) 98.1 F (36.7 C)  TempSrc: Oral Oral Oral Oral  SpO2: 98% 98% 97% 98%    Intake/Output Summary (Last 24 hours) at 05/28/2019 1240 Last data filed at 05/27/2019 1654 Gross per 24 hour  Intake 369.78 ml  Output --  Net  369.78 ml   There were no vitals filed for this visit. Weight change:   There is no height or weight on file to calculate BMI.  Intake/Output from previous day: 12/09 0701 - 12/10 0700 In: 369.8 [I.V.:369.8] Out: -  Intake/Output this shift: No intake/output data recorded.  Examination:  General exam: Alert, awake speech dysarthric able to say his name?? HEENT:Oral  mucosa moist, Ear/Nose WNL grossly, dentition normal. Respiratory system: Diminished at the base,no wheezing or crackles,no use of accessory muscle Cardiovascular system: S1 & S2 +, No JVD,. Gastrointestinal system: Abdomen soft, NT,ND, BS+ Nervous System:Alert, awake, confused, moving his upper extremities  Extremities: No edema, distal peripheral pulses palpable.  Skin: No rashes,no icterus. MSK: Normal muscle bulk,tone, power  Medications:  Scheduled Meds:  insulin aspart  0-15 Units Subcutaneous TID WC   insulin glargine  20 Units Subcutaneous QHS   metoprolol tartrate  5 mg Intravenous Q6H   rosuvastatin  10 mg Oral Q M,W,F   sodium chloride flush  3 mL Intravenous Once   sotalol  120 mg Oral BID   Continuous Infusions:  sodium chloride 50 mL/hr at 05/27/19 1654    Data Reviewed: I have personally reviewed following labs and imaging studies  CBC: Recent Labs  Lab 05/26/19 0839 05/26/19 0856 05/28/19 0417  WBC 11.3*  --  16.0*  NEUTROABS 8.4*  --   --   HGB 13.9 13.9 14.3  HCT 41.3 41.0 42.3  MCV 96.0  --  95.5  PLT 168  --  178   Basic Metabolic Panel: Recent Labs  Lab 05/26/19 0839 05/26/19 0856 05/28/19 0417  NA 137 137 140  K 4.6 4.5 3.8  CL 101 103 105  CO2 23  --  21*  GLUCOSE 188* 188* 161*  BUN 14 15 33*  CREATININE 0.74 0.60* 1.89*  CALCIUM 9.1  --  8.9   GFR: CrCl cannot be calculated (Unknown ideal weight.). Liver Function Tests: Recent Labs  Lab 05/26/19 0839  AST 24  ALT 31  ALKPHOS 58  BILITOT 1.4*  PROT 7.4  ALBUMIN 3.8   No results for input(s): LIPASE, AMYLASE in the last 168 hours. No results for input(s): AMMONIA in the last 168 hours. Coagulation Profile: Recent Labs  Lab 05/26/19 0839  INR 1.0   Cardiac Enzymes: No results for input(s): CKTOTAL, CKMB, CKMBINDEX, TROPONINI in the last 168 hours. BNP (last 3 results) No results for input(s): PROBNP in the last 8760 hours. HbA1C: Recent Labs    05/27/19 0456   HGBA1C 7.3*   CBG: Recent Labs  Lab 05/27/19 0755 05/27/19 1125 05/27/19 1620 05/27/19 2104 05/28/19 0620  GLUCAP 210* 136* 164* 171* 155*   Lipid Profile: Recent Labs    05/27/19 0456  CHOL 169  HDL 43  LDLCALC 108*  TRIG 88  CHOLHDL 3.9   Thyroid Function Tests: No results for input(s): TSH, T4TOTAL, FREET4, T3FREE, THYROIDAB in the last 72 hours. Anemia Panel: No results for input(s): VITAMINB12, FOLATE, FERRITIN, TIBC, IRON, RETICCTPCT in the last 72 hours. Sepsis Labs: No results for input(s): PROCALCITON, LATICACIDVEN in the last 168 hours.  Recent Results (from the past 240 hour(s))  SARS CORONAVIRUS 2 (TAT 6-24 HRS) Nasopharyngeal Nasopharyngeal Swab     Status: None   Collection Time: 05/26/19  3:19 PM   Specimen: Nasopharyngeal Swab  Result Value Ref Range Status   SARS Coronavirus 2 NEGATIVE NEGATIVE Final    Comment: (NOTE) SARS-CoV-2 target nucleic acids are NOT DETECTED. The SARS-CoV-2  RNA is generally detectable in upper and lower respiratory specimens during the acute phase of infection. Negative results do not preclude SARS-CoV-2 infection, do not rule out co-infections with other pathogens, and should not be used as the sole basis for treatment or other patient management decisions. Negative results must be combined with clinical observations, patient history, and epidemiological information. The expected result is Negative. Fact Sheet for Patients: HairSlick.no Fact Sheet for Healthcare Providers: quierodirigir.com This test is not yet approved or cleared by the [place] FDA and  has been authorized for detection and/or diagnosis of SARS-CoV-2 by FDA under an Emergency Use Authorization (EUA). This EUA will remain  in effect (meaning this test can be used) for the duration of the COVID-19 declaration under Section 56 4(b)(1) of the Act, 21 U.S.C. section 360bbb-3(b)(1), unless the  authorization is terminated or revoked sooner. Performed at [facility], [address] [place], [place] [address]       Radiology Studies: DG CHEST PORT 1 VIEW  Result Date: 05/27/2019 CLINICAL DATA:  Altered mental status. EXAM: PORTABLE CHEST 1 VIEW COMPARISON:  Radiographs 03/20/2011 and 01/23/2010. FINDINGS: 1632 hours. The heart size and mediastinal contours are stable status post median sternotomy and CABG. Left subclavian AICD lead projects to the right ventricular apex. The lungs are clear. There is no pleural effusion or pneumothorax. No acute osseous findings. IMPRESSION: Stable postoperative chest. No active cardiopulmonary process. Electronically Signed   By: Carey Bullocks M.D.   On: 05/27/2019 16:42   ECHOCARDIOGRAM COMPLETE  Result Date: 05/27/2019   ECHOCARDIOGRAM REPORT   Patient Name:   QUOC TOME Date of Exam: 05/27/2019 Medical Rec #:  [id number]     Height:       67.0 in Accession #:    [id number]    Weight:       162.6 lb Date of Birth:  1940-11-20      BSA:          1.85 m Patient Age:    78 years      BP:           99/55 mmHg Patient Gender: M             HR:           68 bpm. Exam Location:  Inpatient Procedure: 2D Echo Indications:    Stroke 434.91 / I163.9  History:        Patient has prior history of Echocardiogram examinations, most                 recent 08/12/2018. CAD, Stroke, Arrythmias:Atrial Fibrillation                 and Atrial Flutter; Risk Factors:Former Smoker. Ventricular                 Tachycardia, ICD, DNR, Ischemic Cardiomyopathy.  Sonographer:    Jeryl Columbia Referring Phys: Armin.Lipps SHARON L BIBY IMPRESSIONS  1. Left ventricular ejection fraction, by visual estimation, is 25 to 30%. The left ventricle has severely decreased function. There is no left ventricular hypertrophy.  2. No obvious LV thrombus seen on this exam. Would recommend a limited contrast study to effectively rule this out given the patient's history of stroke.  3. Basal and mid  inferolateral wall and apical inferior segment are abnormal.  4. Mildly dilated left ventricular internal cavity size.  5. The left ventricle demonstrates global hypokinesis.  6. Global right ventricle has normal systolic function.The  right ventricular size is normal. No increase in right ventricular wall thickness.  7. Left atrial size was normal.  8. Right atrial size was normal.  9. Presence of pericardial fat pad. 10. Mild mitral annular calcification. 11. The mitral valve is degenerative. Mild mitral valve regurgitation. 12. The tricuspid valve is grossly normal. Tricuspid valve regurgitation is trivial. 13. The aortic valve is tricuspid. Aortic valve regurgitation is not visualized. Mild to moderate aortic valve sclerosis/calcification without any evidence of aortic stenosis. 14. The pulmonic valve was grossly normal. Pulmonic valve regurgitation is not visualized. 15. TR signal is inadequate for assessing pulmonary artery systolic pressure. 16. A pacer wire is visualized in the RA and RV. 17. The inferior vena cava is normal in size with greater than 50% respiratory variability, suggesting right atrial pressure of 3 mmHg. 18. A prior study was performed on 08/12/2018. 19. No change from prior study. FINDINGS  Left Ventricle: Left ventricular ejection fraction, by visual estimation, is 25 to 30%. The left ventricle has severely decreased function. The left ventricle demonstrates global hypokinesis. The left ventricular internal cavity size was mildly dilated left ventricle. There is no left ventricular hypertrophy. No obvious LV thrombus seen on this exam. Would recommend a limited contrast study to effectively rule this out given the patient's history of stroke.  LV Wall Scoring: The basal and mid inferolateral wall and apical inferior segment are akinetic. Right Ventricle: The right ventricular size is normal. No increase in right ventricular wall thickness. Global RV systolic function is has normal systolic  function. Left Atrium: Left atrial size was normal in size. Right Atrium: Right atrial size was normal in size Pericardium: There is no evidence of pericardial effusion. Presence of pericardial fat pad. Mitral Valve: The mitral valve is degenerative in appearance. Mild mitral annular calcification. Mild mitral valve regurgitation. Tricuspid Valve: The tricuspid valve is grossly normal. Tricuspid valve regurgitation is trivial. Aortic Valve: The aortic valve is tricuspid. Aortic valve regurgitation is not visualized. Mild to moderate aortic valve sclerosis/calcification is present, without any evidence of aortic stenosis. Pulmonic Valve: The pulmonic valve was grossly normal. Pulmonic valve regurgitation is not visualized. Pulmonic regurgitation is not visualized. Aorta: The aortic root is normal in size and structure. Venous: The inferior vena cava is normal in size with greater than 50% respiratory variability, suggesting right atrial pressure of 3 mmHg. IAS/Shunts: No atrial level shunt detected by color flow Doppler. Additional Comments: A prior study was performed on 08/12/2018. A pacer wire is visualized in the right atrium and right ventricle.  LEFT VENTRICLE PLAX 2D LVIDd:         5.90 cm  Diastology LVIDs:         5.20 cm  LV e' lateral:   6.50 cm/s LV PW:         0.90 cm  LV E/e' lateral: 7.4 LV IVS:        1.00 cm  LV e' medial:    4.75 cm/s LVOT diam:     2.10 cm  LV E/e' medial:  10.1 LV SV:         44 ml LV SV Index:   23.26 LVOT Area:     3.46 cm  RIGHT VENTRICLE TAPSE (M-mode): 1.2 cm LEFT ATRIUM             Index       RIGHT ATRIUM           Index LA diam:  4.20 cm 2.27 cm/m  RA Area:     12.70 cm LA Vol (A2C):   67.1 ml 36.23 ml/m RA Volume:   29.30 ml  15.82 ml/m LA Vol (A4C):   52.0 ml 28.08 ml/m LA Biplane Vol: 61.6 ml 33.26 ml/m   AORTA Ao Root diam: 3.70 cm MITRAL VALVE MV Area (PHT): 3.72 cm             SHUNTS MV PHT:        59.16 msec           Systemic Diam: 2.10 cm MV Decel  Time: 204 msec MV E velocity: 48.00 cm/s 103 cm/s MV A velocity: 36.00 cm/s 70.3 cm/s MV E/A ratio:  1.33       1.5  Lennie OdorWesley O'Neal MD Electronically signed by Lennie OdorWesley O'Neal MD Signature Date/Time: 05/27/2019/6:07:40 PM    Final    [facility]ID (at [facility] and [facility] only)  Result Date: 05/26/2019 Carotid Arterial Duplex Study Indications:       CVA. Risk Factors:      Hypertension, hyperlipidemia, Diabetes, coronary artery                    disease, prior CVA. Comparison Study:  no prior Performing Technologist: Blanch MediaMegan Riddle RVS  Examination Guidelines: A complete evaluation includes B-mode imaging, spectral Doppler, color Doppler, and power Doppler as needed of all accessible portions of each vessel. Bilateral testing is considered an integral part of a complete examination. Limited examinations for reoccurring indications may be performed as noted.  Right Carotid Findings: +----------+--------+--------+--------+------------------+--------+             PSV cm/s EDV cm/s Stenosis Plaque Description Comments  +----------+--------+--------+--------+------------------+--------+  CCA Prox   50       10                heterogenous                 +----------+--------+--------+--------+------------------+--------+  CCA Distal 58       11                heterogenous                 +----------+--------+--------+--------+------------------+--------+  ICA Prox   120      19       1-39%    heterogenous                 +----------+--------+--------+--------+------------------+--------+  ICA Distal 80       21                                             +----------+--------+--------+--------+------------------+--------+  ECA        75                                                      +----------+--------+--------+--------+------------------+--------+ +----------+--------+-------+--------+-------------------+             PSV cm/s EDV cms Describe Arm Pressure (mmHG)  +----------+--------+-------+--------+-------------------+   Subclavian 123                                            +----------+--------+-------+--------+-------------------+ +---------+--------+--+--------+-+---------+  Vertebral PSV cm/s 40 EDV cm/s 8 Antegrade  +---------+--------+--+--------+-+---------+  Left Carotid Findings: +----------+--------+--------+--------+------------------+---------+             PSV cm/s EDV cm/s Stenosis Plaque Description Comments   +----------+--------+--------+--------+------------------+---------+  CCA Prox   69       12                heterogenous                  +----------+--------+--------+--------+------------------+---------+  CCA Distal 75       17                heterogenous                  +----------+--------+--------+--------+------------------+---------+  ICA Prox   97       24       1-39%    heterogenous       Shadowing  +----------+--------+--------+--------+------------------+---------+  ICA Distal 71       17                                              +----------+--------+--------+--------+------------------+---------+  ECA        83                                                       +----------+--------+--------+--------+------------------+---------+ +----------+--------+--------+--------+-------------------+             PSV cm/s EDV cm/s Describe Arm Pressure (mmHG)  +----------+--------+--------+--------+-------------------+  Subclavian 109                                             +----------+--------+--------+--------+-------------------+ +---------+--------+--+--------+-+---------+  Vertebral PSV cm/s 29 EDV cm/s 5 Antegrade  +---------+--------+--+--------+-+---------+  Summary:   *See table(s) above for measurements and observations.  Electronically signed by Coral Else MD on 05/26/2019 at 5:21:33 PM.    Final       LOS: 2 days   Time spent: More than 50% of that time was spent in counseling and/or coordination of care.  Lanae Boast, MD [facility]  05/28/2019, 12:40 PM

## 2019-05-28 NOTE — Progress Notes (Signed)
Physical Therapy Treatment Patient Details Name: Tristan Myers MRN: [id number] DOB: May 14, 1941 Today's Date: 05/28/2019    History of Present Illness Tristan Myers is a 78 y.o. 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    PT Comments    Pt received sacral sitting in chair and sleeping. Pt alert once stimulated; follows ~50% of one step commands but demonstrates impulsivity and decreased attention. Requiring moderate assist (+2 safety) and walker for transfer back to bed. Displays retropulsion and static/dynamic balance impairments. Continue to recommend comprehensive inpatient rehab (CIR) for post-acute therapy needs.    Follow Up Recommendations  CIR     Equipment Recommendations  Wheelchair (measurements PT)    Recommendations for Other Services       Precautions / Restrictions Precautions Precautions: Fall Restrictions Weight Bearing Restrictions: No    Mobility  Bed Mobility Overal bed mobility: Needs Assistance Bed Mobility: Sit to Supine       Sit to supine: Min guard   General bed mobility comments: pt able to lie back into supine without physical assist; use of bed pad to reposition   Transfers Overall transfer level: Needs assistance Equipment used: Rolling walker (2 wheeled) Transfers: Sit to/from [employer] Sit to Stand: Mod assist;+2 safety/equipment         General transfer comment: ModA to stand from recliner with initial posterior lean, pivoting towards left with cues for sequencing/direction. Pt sitting abruptly on edge of bed without warning, but able to scoot hips back when instructed   Ambulation/Gait                 Stairs             Wheelchair Mobility    Modified Rankin (Stroke Patients  Only) Modified Rankin (Stroke Patients Only) Pre-Morbid Rankin Score: No significant disability Modified Rankin: Severe disability     Balance Overall balance assessment: Needs assistance Sitting-balance support: Feet supported Sitting balance-Leahy Scale: Fair     Standing balance support: Bilateral upper extremity supported Standing balance-Leahy Scale: Poor Standing balance comment: posterior bias                            Cognition Arousal/Alertness: Lethargic Behavior During Therapy: Impulsive Overall Cognitive Status: History of cognitive impairments - at baseline Area of Impairment: Attention                   Current Attention Level: Focused   Following Commands: Follows one step commands inconsistently Safety/Judgement: Decreased awareness of safety;Decreased awareness of deficits     General Comments: Pt with unintelligible speech; able to follow ~50% or more of one step commands once aroused. Demonstrates impulsivity i.e. sitting abruptly without warning on edge of bed. Needs cues for safety.      Exercises      General Comments        Pertinent Vitals/Pain Pain Assessment: Faces Faces Pain Scale: No hurt    Home Living                      Prior Function            PT Goals (current goals can now be found in the care plan section) Acute Rehab PT  Goals Patient Stated Goal: unable to state Potential to Achieve Goals: Fair Progress towards PT goals: Progressing toward goals    Frequency    Min 3X/week      PT Plan Current plan remains appropriate    Co-evaluation              AM-PAC PT "6 Clicks" Mobility   Outcome Measure  Help needed turning from your back to your side while in a flat bed without using bedrails?: A Little Help needed moving from lying on your back to sitting on the side of a flat bed without using bedrails?: A Little Help needed moving to and from a bed to a chair (including a  wheelchair)?: A Lot Help needed standing up from a chair using your arms (e.g., wheelchair or bedside chair)?: A Lot Help needed to walk in hospital room?: Total Help needed climbing 3-5 steps with a railing? : Total 6 Click Score: 12    End of Session Equipment Utilized During Treatment: Gait belt Activity Tolerance: Patient limited by fatigue Patient left: in bed;with call bell/phone within reach Nurse Communication: Mobility status PT Visit Diagnosis: Other abnormalities of gait and mobility (R26.89);Other symptoms and signs involving the nervous system (R29.898);Muscle weakness (generalized) (M62.81)     Time: 3785-8850 PT Time Calculation (min) (ACUTE ONLY): 13 min  Charges:  $Therapeutic Activity: 8-22 mins                     Ellamae Sia, PT, DPT Acute Rehabilitation Services Pager [phone] Office [phone]    Willy Eddy 05/28/2019, 4:23 PM

## 2019-05-29 DIAGNOSIS — Z66 Do not resuscitate: Secondary | ICD-10-CM

## 2019-05-29 DIAGNOSIS — I63441 Cerebral infarction due to embolism of right cerebellar artery: Secondary | ICD-10-CM

## 2019-05-29 LAB — CBC
HCT: 40.1 % (ref 39.0–52.0)
Hemoglobin: 13.8 g/dL (ref 13.0–17.0)
MCH: 32.5 pg (ref 26.0–34.0)
MCHC: 34.4 g/dL (ref 30.0–36.0)
MCV: 94.6 fL (ref 80.0–100.0)
Platelets: 170 10*3/uL (ref 150–400)
RBC: 4.24 MIL/uL (ref 4.22–5.81)
RDW: 13 % (ref 11.5–15.5)
WBC: 13.5 10*3/uL — ABNORMAL HIGH (ref 4.0–10.5)
nRBC: 0 % (ref 0.0–0.2)

## 2019-05-29 LAB — BASIC METABOLIC PANEL
Anion gap: 12 (ref 5–15)
BUN: 35 mg/dL — ABNORMAL HIGH (ref 8–23)
CO2: 22 mmol/L (ref 22–32)
Calcium: 9.1 mg/dL (ref 8.9–10.3)
Chloride: 110 mmol/L (ref 98–111)
Creatinine, Ser: 1.67 mg/dL — ABNORMAL HIGH (ref 0.61–1.24)
GFR calc Af Amer: 45 mL/min — ABNORMAL LOW (ref >60)
GFR calc non Af Amer: 39 mL/min — ABNORMAL LOW (ref >60)
Glucose, Bld: 157 mg/dL — ABNORMAL HIGH (ref 70–99)
Potassium: 3.6 mmol/L (ref 3.5–5.1)
Sodium: 144 mmol/L (ref 135–145)

## 2019-05-29 LAB — GLUCOSE, CAPILLARY
Glucose-Capillary: 168 mg/dL — ABNORMAL HIGH (ref 70–99)
Glucose-Capillary: 156 mg/dL — ABNORMAL HIGH (ref 70–99)
Glucose-Capillary: 142 mg/dL — ABNORMAL HIGH (ref 70–99)
Glucose-Capillary: 127 mg/dL — ABNORMAL HIGH (ref 70–99)

## 2019-05-29 MED ORDER — ASPIRIN EC 81 MG PO TBEC
81.0000 mg | DELAYED_RELEASE_TABLET | Freq: Every day | ORAL | Status: DC
  Administered 2019-05-29 – 2019-06-02 (×5): 81 mg via ORAL
  Filled 2019-05-29 (×5): qty 1

## 2019-05-29 NOTE — Progress Notes (Signed)
Physical Therapy Treatment Patient Details Name: Tristan Myers MRN: [id number] DOB: 02/13/1941 Today's Date: 05/29/2019    History of Present Illness Tristan Myers is a 78 y.o. 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    PT Comments    Pt progressing steadily towards physical therapy goals. Requiring min assist for transfers. Ambulating from bed to door with two person moderate assist, walker, and close chair follow. Pt with varying levels of alertness noted during session. Unable to communicate basic needs; following most motor commands. Continue to recommend comprehensive inpatient rehab (CIR) for post-acute therapy needs.    Follow Up Recommendations  CIR     Equipment Recommendations  Wheelchair (measurements PT)    Recommendations for Other Services       Precautions / Restrictions Precautions Precautions: Fall Restrictions Weight Bearing Restrictions: No    Mobility  Bed Mobility Overal bed mobility: Needs Assistance Bed Mobility: Supine to Sit       Sit to supine: Min guard   General bed mobility comments: Min guard to sit up in bed   Transfers Overall transfer level: Needs assistance Equipment used: Rolling walker (2 wheeled) Transfers: Sit to/from Stand Sit to Stand: +2 safety/equipment;Min assist         General transfer comment: MinA + 2 to stand from edge of bed; good power up  Ambulation/Gait Ambulation/Gait assistance: Mod assist;+2 physical assistance;+2 safety/equipment Gait Distance (Feet): 12 Feet Assistive device: Rolling walker (2 wheeled) Gait Pattern/deviations: Step-to pattern;Shuffle;Narrow base of support;Trunk flexed;Drifts right/left Gait velocity: decreased   General Gait Details: Pt requiring modA + 2 for  stability, close chair follow utilized. Poor proximity to walker despite cues and needs manual assist for walker steering and maintaining straight path.   Stairs             Wheelchair Mobility    Modified Rankin (Stroke Patients Only) Modified Rankin (Stroke Patients Only) Pre-Morbid Rankin Score: No significant disability Modified Rankin: Severe disability     Balance Overall balance assessment: Needs assistance Sitting-balance support: Feet supported Sitting balance-Leahy Scale: Fair     Standing balance support: Bilateral upper extremity supported Standing balance-Leahy Scale: Poor Standing balance comment: posterior bias                            Cognition Arousal/Alertness: Lethargic Behavior During Therapy: Impulsive Overall Cognitive Status: History of cognitive impairments - at baseline Area of Impairment: Attention                   Current Attention Level: Focused   Following Commands: Follows one step commands inconsistently Safety/Judgement: Decreased awareness of safety;Decreased awareness of deficits     General Comments: Pt with unintelligble speech; varying levels of alertness noted. Able to follow motor/automatic tasks >50% of the time      Exercises      General Comments        Pertinent Vitals/Pain Pain Assessment: Faces Faces Pain Scale: No hurt    Home Living                      Prior Function            PT Goals (current goals can now be found in the  care plan section) Acute Rehab PT Goals Patient Stated Goal: unable to state Potential to Achieve Goals: Fair Progress towards PT goals: Progressing toward goals    Frequency    Min 3X/week      PT Plan Current plan remains appropriate    Co-evaluation              AM-PAC PT "6 Clicks" Mobility   Outcome Measure  Help needed turning from your back to your side while in a flat bed without using bedrails?: A Little Help needed  moving from lying on your back to sitting on the side of a flat bed without using bedrails?: A Little Help needed moving to and from a bed to a chair (including a wheelchair)?: A Lot Help needed standing up from a chair using your arms (e.g., wheelchair or bedside chair)?: A Lot Help needed to walk in hospital room?: Total Help needed climbing 3-5 steps with a railing? : Total 6 Click Score: 12    End of Session Equipment Utilized During Treatment: Gait belt Activity Tolerance: Patient limited by fatigue Patient left: with call bell/phone within reach;in chair;with chair alarm set Nurse Communication: Mobility status PT Visit Diagnosis: Other abnormalities of gait and mobility (R26.89);Other symptoms and signs involving the nervous system (R29.898);Muscle weakness (generalized) (M62.81)     Time: 1023-1050 PT Time Calculation (min) (ACUTE ONLY): 27 min  Charges:  $Gait Training: 8-22 mins $Therapeutic Activity: 8-22 mins                     Laurina Bustle, PT, DPT Acute Rehabilitation Services Pager [phone] Office [phone]    Vanetta Mulders 05/29/2019, 11:19 AM

## 2019-05-29 NOTE — Progress Notes (Addendum)
PROGRESS NOTE    Tristan Myers  ZOX:[id number] DOB: 03/15/1941 DOA: 05/26/2019 PCP: Kaleen MaskElkins, Wilson Oliver, MD   Brief Narrative: 78 year old male with history of AAA fever on warfarin previously but not currently, history of previous strokes left MCA embolic stroke in 2011 was started on Coumadin but refused to follow-up and admitted a month later with hemorrhagic conversion-and, and was discontinued, was kept on aspirin, history of CAD status post CABG, DM, HLD, PVD, history of ICD placement presented from home with 4-day history of altered mental status, speech and gait abnormalities.  05/30/2019: Patient seen.  No new complaints.  Patient is awaiting skilled nursing facility placement.  Pursue disposition when a bed is available.  Subjective: No complaints. No new motor symptoms or speech problems according to patient Patient may be a poor historian  Assessment & Plan:  Right cerebellar infarct with mild reperfusion hemorrhage: moderate-sized infarct with mild reperfusion hemorrhage in the CT head unable to obtain MRI due to AICD. Infarct embolic secondary to known A. fib not on anticoagulation.  Reviewed CT head CT head and neck carotid Dopplers, 2D echo: Bilateral 1-39% carotid stenosis, EF 25 to 30% no definite clot noted, CTA head and neck no large vessel stenosis or occlusion.  LDL 108 target less than 70, hemoglobin A1c 7.3 target less than 7  Home asa on hold due to hemorrhage.  Continue aggressive risk factor modification, PT/OT SLP and CIR.Continue statins. 05/30/2019: Continue antiplatelet therapy as per neurology team.  Dysphagia/dysarthria/cognitive impairment secondary to stroke. SLP following- placed on regular thin liquid diet. Wife mentioned that patient has had decline in past 3 months and they have taken over his finances and medication.  Speech following.  Acute metabolic encephalopathy in the setting of stroke with baseline confusion/cognitive impairment it seems:Continue  supportive care, fall precaution.  AKI On admission 0.7.Suspect multifactorial with IV contrast use, decreased oral intake.  Has EF of 25 to 30% avoid aggressive hydration.  Continue gentle IV normal saline. Watch for fluid overload.monitor chemistry panel and urine output.  Creatinine downtrending. 05/30/2019: AKI slowly improving.  Serum creatinine is 1.67 today.  Continue to monitor closely. Recent Labs  Lab 05/26/19 0839 05/26/19 0856 05/28/19 0417 05/29/19 0220  BUN 14 15 33* 35*  CREATININE 0.74 0.60* 1.89* 1.67*   History of stroke x2: 01/2010 - L MCA infarct w/ hemorrhagic transformation in setting of INR 4.16. refused to go to f/u MD or therapy appts but did take meds as prescribed after stroke in July 2011. INR reversed in subsequent admission in August for infarct with hemorrhage- and [facility] and kept on aspirin.  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  Cont metoprolol and sotalol. Similarly reduced EF IN ECHO  Hypertension: Blood pressure stable. cont Metoprolol.  Home losartan on hold.  Atrial fibrillation and flutter:Currently normal sinus rhythm.  Unable to anticoagulate due to hemorrhage in August 2011.Followed by cardiology D Graciela HusbandsKlein.  Diabetes mellitus type 2 uncontrolled A1c 7.3, goal less than 7: Blood sugar well controlled 130s to 190s. Continue sliding scale insulin,  Lantus 20 units and monitor Accu-Chek. Per wife she gives him 70/30 novolog: sliding scale like- about 10 units if  Sugar in 200,  and 20 units if if>250 and gives twice a day.  HLD:  Cont crestor 10 mg ( from home) ,cont, ldl goal <70, now at 108  DNR: present on admission  Incontinence of urine/bowel for  3 months  And mostly bedbound barely able get to bathroom with walker.   Pressure Ulcer: Pressure Injury 05/27/19 Buttocks Right;Left Stage II -  Partial thickness loss of dermis  presenting as a shallow open ulcer with a red, pink wound bed without slough. 2 dime size open areas with no drainage noted (Active)  05/27/19 1623  Location: Buttocks  Location Orientation: Right;Left  Staging: Stage II -  Partial thickness loss of dermis presenting as a shallow open ulcer with a red, pink wound bed without slough.  Wound Description (Comments): 2 dime size open areas with no drainage noted  Present on Admission: Yes   There is no height or weight on file to calculate BMI.   DVT prophylaxis: SCD no chemical prophylaxis due to hemorrhagic conversion risk Code Status: DNR Family Communication: plan of care discussed with patient's wife-she reports inability to care for him and looking into CIR Disposition Plan: Remains inpatient pending clinical improvement. Awaiting CIR.  Consultants: Neurology. Procedures: MRI brain, carotid ultrasound,   CT head CTA head and neck 1. Acute infarct right superior cerebellum with hemorrhagic transformation unchanged. 2. 20% diameter stenosis proximal right internal carotid artery and 35% diameter stenosis proximal left internal carotid artery. 3. Left vertebral artery dominant. Mild calcific stenosis at the origin of the left vertebral artery. 4. No significant intracranial stenosis.  TTE 05/27/19   1. Left ventricular ejection fraction, by visual estimation, is 25 to 30%. The left ventricle has severely decreased function. There is no left ventricular hypertrophy.  2. No obvious LV thrombus seen on this exam. Would recommend a limited contrast study to effectively rule this out given the patient's history of stroke.  3. Basal and mid inferolateral wall and apical inferior segment are abnormal.  4. Mildly dilated left ventricular internal cavity size.  5. The left ventricle demonstrates global hypokinesis.  6. Global right ventricle has normal systolic function.The right ventricular size is normal. No increase in right ventricular  wall thickness.  7. Left atrial size was normal.  8. Right atrial size was normal.  9. Presence of pericardial fat pad. 10. Mild mitral annular calcification. 11. The mitral valve is degenerative. Mild mitral valve regurgitation. 12. The tricuspid valve is grossly normal. Tricuspid valve regurgitation is trivial. 13. The aortic valve is tricuspid. Aortic valve regurgitation is not visualized. Mild to moderate aortic valve sclerosis/calcification without any evidence of aortic stenosis. 14. The pulmonic valve was grossly normal. Pulmonic valve regurgitation is not visualized. 15. TR signal is inadequate for assessing pulmonary artery systolic pressure. 16. A pacer wire is visualized in the RA and RV. 17. The inferior vena cava is normal in size with greater than 50% respiratory variability, suggesting right atrial pressure of 3 mmHg. 18. A prior study was performed on 08/12/2018. 19. No change from prior study. TTE Microbiology: COVID-19 negative  Antimicrobials: Anti-infectives (From admission, onward)   None       Objective: Vitals:   05/28/19 1607 05/28/19 2013 05/28/19 2321 05/29/19 0345  BP: (!) 169/74 (!) 151/77 135/72 (!) 151/72  Pulse: 74 76 66 65  Resp: 17 20 18 19   Temp:  98.2 F (36.8 C) 98.5 F (36.9 C) 98.2 F (36.8 C)  TempSrc:  Oral Oral Axillary  SpO2: 96% 96% 99% 98%    Intake/Output Summary (Last 24 hours) at 05/29/2019 0740 Last data filed at 05/29/2019 0356 Gross per 24 hour  Intake --  Output 900 ml  Net -900 ml   There were no vitals  filed for this visit. Weight change:   There is no height or weight on file to calculate BMI.  Intake/Output from previous day: 12/10 0701 - 12/11 0700 In: -  Out: 900 [Urine:900] Intake/Output this shift: No intake/output data recorded.  Examination:  General exam: Alert and awake.  Not in any obvious distress.  HEENT: Mild pallor.  No jaundice Respiratory system: Clear to auscultation. Cardiovascular  system: S1 & S2 +, ?irregular. Gastrointestinal system: Abdomen soft, NT,ND, BS+ Nervous System: Awake and alert. Dysarthric. Moves all extremities Extremities: No edema   Medications:  Scheduled Meds: . insulin aspart  0-15 Units Subcutaneous TID WC  . insulin glargine  20 Units Subcutaneous QHS  . metoprolol succinate  25 mg Oral Daily  . rosuvastatin  10 mg Oral Q M,W,F  . sodium chloride flush  3 mL Intravenous Once  . sotalol  120 mg Oral BID   Continuous Infusions: . sodium chloride 50 mL/hr at 05/29/19 1610    Data Reviewed: I have personally reviewed following labs and imaging studies  CBC: Recent Labs  Lab 05/26/19 0839 05/26/19 0856 05/28/19 0417 05/29/19 0220  WBC 11.3*  --  16.0* 13.5*  NEUTROABS 8.4*  --   --   --   HGB 13.9 13.9 14.3 13.8  HCT 41.3 41.0 42.3 40.1  MCV 96.0  --  95.5 94.6  PLT 168  --  178 170   Basic Metabolic Panel: Recent Labs  Lab 05/26/19 0839 05/26/19 0856 05/28/19 0417 05/29/19 0220  NA 137 137 140 144  K 4.6 4.5 3.8 3.6  CL 101 103 105 110  CO2 23  --  21* 22  GLUCOSE 188* 188* 161* 157*  BUN 14 15 33* 35*  CREATININE 0.74 0.60* 1.89* 1.67*  CALCIUM 9.1  --  8.9 9.1   GFR: CrCl cannot be calculated (Unknown ideal weight.). Liver Function Tests: Recent Labs  Lab 05/26/19 0839  AST 24  ALT 31  ALKPHOS 58  BILITOT 1.4*  PROT 7.4  ALBUMIN 3.8   No results for input(s): LIPASE, AMYLASE in the last 168 hours. No results for input(s): AMMONIA in the last 168 hours. Coagulation Profile: Recent Labs  Lab 05/26/19 0839  INR 1.0   Cardiac Enzymes: No results for input(s): CKTOTAL, CKMB, CKMBINDEX, TROPONINI in the last 168 hours. BNP (last 3 results) No results for input(s): PROBNP in the last 8760 hours. HbA1C: Recent Labs    05/27/19 0456  HGBA1C 7.3*   CBG: Recent Labs  Lab 05/28/19 0620 05/28/19 1243 05/28/19 1720 05/28/19 2113 05/29/19 0648  GLUCAP 155* 197* 197* 130* 168*   Lipid  Profile: Recent Labs    05/27/19 0456  CHOL 169  HDL 43  LDLCALC 108*  TRIG 88  CHOLHDL 3.9   Thyroid Function Tests: No results for input(s): TSH, T4TOTAL, FREET4, T3FREE, THYROIDAB in the last 72 hours. Anemia Panel: No results for input(s): VITAMINB12, FOLATE, FERRITIN, TIBC, IRON, RETICCTPCT in the last 72 hours. Sepsis Labs: No results for input(s): PROCALCITON, LATICACIDVEN in the last 168 hours.  Recent Results (from the past 240 hour(s))  SARS CORONAVIRUS 2 (TAT 6-24 HRS) Nasopharyngeal Nasopharyngeal Swab     Status: None   Collection Time: 05/26/19  3:19 PM   Specimen: Nasopharyngeal Swab  Result Value Ref Range Status   SARS Coronavirus 2 NEGATIVE NEGATIVE Final    Comment: (NOTE) SARS-CoV-2 target nucleic acids are NOT DETECTED. The SARS-CoV-2 RNA is generally detectable in upper and lower respiratory specimens during  the acute phase of infection. Negative results do not preclude SARS-CoV-2 infection, do not rule out co-infections with other pathogens, and should not be used as the sole basis for treatment or other patient management decisions. Negative results must be combined with clinical observations, patient history, and epidemiological information. The expected result is Negative. Fact Sheet for Patients: HairSlick.no Fact Sheet for Healthcare Providers: quierodirigir.com This test is not yet approved or cleared by the [place] FDA and  has been authorized for detection and/or diagnosis of SARS-CoV-2 by FDA under an Emergency Use Authorization (EUA). This EUA will remain  in effect (meaning this test can be used) for the duration of the COVID-19 declaration under Section 56 4(b)(1) of the Act, 21 U.S.C. section 360bbb-3(b)(1), unless the authorization is terminated or revoked sooner. Performed at [facility], [address], [place], [place] [address]       Radiology Studies: CT ANGIO  HEAD W OR WO CONTRAST  Result Date: 05/28/2019 CLINICAL DATA:  Follow-up stroke. EXAM: CT ANGIOGRAPHY HEAD AND NECK TECHNIQUE: Multidetector CT imaging of the head and neck was performed using the standard protocol during bolus administration of intravenous contrast. Multiplanar CT image reconstructions and MIPs were obtained to evaluate the vascular anatomy. Carotid stenosis measurements (when applicable) are obtained utilizing NASCET criteria, using the distal internal carotid diameter as the denominator. CONTRAST:  50mL OMNIPAQUE IOHEXOL 350 MG/ML SOLN COMPARISON:  CT head 05/26/2019 FINDINGS: CT HEAD FINDINGS Brain: Hypodensity in the right superior cerebellum similar in size. Associated high-density hemorrhage within the hypodensity, also unchanged. Findings most compatible with acute infarct with hemorrhage. Chronic infarcts in the right frontal parietal lobe. Larger area of chronic infarct in the left temporoparietal lobe. Generalized atrophy without hydrocephalus. Vascular: Negative for hyperdense vessel Skull: Negative Sinuses: Negative Orbits: Negative Review of the MIP images confirms the above findings CTA NECK FINDINGS Aortic arch: Standard branching. Imaged portion shows no evidence of aneurysm or dissection. No significant stenosis of the major arch vessel origins. Atherosclerotic calcification in the aortic arch and proximal great vessels. Right carotid system: Atherosclerotic calcification right carotid bifurcation. Estimated 20% diameter stenosis right internal carotid artery. Calcified plaque right common carotid artery without significant stenosis. Left carotid system: Atherosclerotic calcification left carotid bifurcation. 35% diameter stenosis proximal left internal carotid artery. Vertebral arteries: Left vertebral artery dominant. Mild calcific stenosis at the origin. Right vertebral artery is patent without significant stenosis. Skeleton: Cervical spondylosis. No acute skeletal  abnormality Other neck: Negative for mass or soft tissue swelling in the neck. Upper chest: Lung apices clear bilaterally. Pacemaker pack in the left chest. Median sternotomy. Review of the MIP images confirms the above findings CTA HEAD FINDINGS Anterior circulation: Cavernous carotid widely patent bilaterally. Anterior and middle cerebral arteries patent bilaterally without stenosis. Posterior circulation: Both vertebral arteries patent to the basilar. PICA patent bilaterally. Basilar widely patent. Superior cerebellar and posterior cerebral arteries patent bilaterally. Fetal origin of the posterior cerebral artery bilaterally. Venous sinuses: Normal venous enhancement. Anatomic variants: None Review of the MIP images confirms the above findings IMPRESSION: 1. Acute infarct right superior cerebellum with hemorrhagic transformation unchanged. 2. 20% diameter stenosis proximal right internal carotid artery and 35% diameter stenosis proximal left internal carotid artery. 3. Left vertebral artery dominant. Mild calcific stenosis at the origin of the left vertebral artery. 4. No significant intracranial stenosis. Electronically Signed   By: Marlan Palau M.D.   On: 05/28/2019 12:40   CT ANGIO NECK W OR WO CONTRAST  Result Date:  05/28/2019 CLINICAL DATA:  Follow-up stroke. EXAM: CT ANGIOGRAPHY HEAD AND NECK TECHNIQUE: Multidetector CT imaging of the head and neck was performed using the standard protocol during bolus administration of intravenous contrast. Multiplanar CT image reconstructions and MIPs were obtained to evaluate the vascular anatomy. Carotid stenosis measurements (when applicable) are obtained utilizing NASCET criteria, using the distal internal carotid diameter as the denominator. CONTRAST:  50mL OMNIPAQUE IOHEXOL 350 MG/ML SOLN COMPARISON:  CT head 05/26/2019 FINDINGS: CT HEAD FINDINGS Brain: Hypodensity in the right superior cerebellum similar in size. Associated high-density hemorrhage within  the hypodensity, also unchanged. Findings most compatible with acute infarct with hemorrhage. Chronic infarcts in the right frontal parietal lobe. Larger area of chronic infarct in the left temporoparietal lobe. Generalized atrophy without hydrocephalus. Vascular: Negative for hyperdense vessel Skull: Negative Sinuses: Negative Orbits: Negative Review of the MIP images confirms the above findings CTA NECK FINDINGS Aortic arch: Standard branching. Imaged portion shows no evidence of aneurysm or dissection. No significant stenosis of the major arch vessel origins. Atherosclerotic calcification in the aortic arch and proximal great vessels. Right carotid system: Atherosclerotic calcification right carotid bifurcation. Estimated 20% diameter stenosis right internal carotid artery. Calcified plaque right common carotid artery without significant stenosis. Left carotid system: Atherosclerotic calcification left carotid bifurcation. 35% diameter stenosis proximal left internal carotid artery. Vertebral arteries: Left vertebral artery dominant. Mild calcific stenosis at the origin. Right vertebral artery is patent without significant stenosis. Skeleton: Cervical spondylosis. No acute skeletal abnormality Other neck: Negative for mass or soft tissue swelling in the neck. Upper chest: Lung apices clear bilaterally. Pacemaker pack in the left chest. Median sternotomy. Review of the MIP images confirms the above findings CTA HEAD FINDINGS Anterior circulation: Cavernous carotid widely patent bilaterally. Anterior and middle cerebral arteries patent bilaterally without stenosis. Posterior circulation: Both vertebral arteries patent to the basilar. PICA patent bilaterally. Basilar widely patent. Superior cerebellar and posterior cerebral arteries patent bilaterally. Fetal origin of the posterior cerebral artery bilaterally. Venous sinuses: Normal venous enhancement. Anatomic variants: None Review of the MIP images confirms the  above findings IMPRESSION: 1. Acute infarct right superior cerebellum with hemorrhagic transformation unchanged. 2. 20% diameter stenosis proximal right internal carotid artery and 35% diameter stenosis proximal left internal carotid artery. 3. Left vertebral artery dominant. Mild calcific stenosis at the origin of the left vertebral artery. 4. No significant intracranial stenosis. Electronically Signed   By: Marlan Palau M.D.   On: 05/28/2019 12:40   DG CHEST PORT 1 VIEW  Result Date: 05/27/2019 CLINICAL DATA:  Altered mental status. EXAM: PORTABLE CHEST 1 VIEW COMPARISON:  Radiographs 03/20/2011 and 01/23/2010. FINDINGS: 1632 hours. The heart size and mediastinal contours are stable status post median sternotomy and CABG. Left subclavian AICD lead projects to the right ventricular apex. The lungs are clear. There is no pleural effusion or pneumothorax. No acute osseous findings. IMPRESSION: Stable postoperative chest. No active cardiopulmonary process. Electronically Signed   By: Carey Bullocks M.D.   On: 05/27/2019 16:42   ECHOCARDIOGRAM COMPLETE  Result Date: 05/27/2019   ECHOCARDIOGRAM REPORT   Patient Name:   Tristan Myers Date of Exam: 05/27/2019 Medical Rec #:  [id number]     Height:       67.0 in Accession #:    [id number]    Weight:       162.6 lb Date of Birth:  [birth year]      BSA:          1.85 m Patient Age:  78 years      BP:           99/55 mmHg Patient Gender: M             HR:           68 bpm. Exam Location:  Inpatient Procedure: 2D Echo Indications:    Stroke 434.91 / I163.9  History:        Patient has prior history of Echocardiogram examinations, most                 recent 08/12/2018. CAD, Stroke, Arrythmias:Atrial Fibrillation                 and Atrial Flutter; Risk Factors:Former Smoker. Ventricular                 Tachycardia, ICD, DNR, Ischemic Cardiomyopathy.  Sonographer:    Jeryl Columbia Referring Phys: Armin.Lipps SHARON L BIBY IMPRESSIONS  1. Left ventricular ejection fraction,  by visual estimation, is 25 to 30%. The left ventricle has severely decreased function. There is no left ventricular hypertrophy.  2. No obvious LV thrombus seen on this exam. Would recommend a limited contrast study to effectively rule this out given the patient's history of stroke.  3. Basal and mid inferolateral wall and apical inferior segment are abnormal.  4. Mildly dilated left ventricular internal cavity size.  5. The left ventricle demonstrates global hypokinesis.  6. Global right ventricle has normal systolic function.The right ventricular size is normal. No increase in right ventricular wall thickness.  7. Left atrial size was normal.  8. Right atrial size was normal.  9. Presence of pericardial fat pad. 10. Mild mitral annular calcification. 11. The mitral valve is degenerative. Mild mitral valve regurgitation. 12. The tricuspid valve is grossly normal. Tricuspid valve regurgitation is trivial. 13. The aortic valve is tricuspid. Aortic valve regurgitation is not visualized. Mild to moderate aortic valve sclerosis/calcification without any evidence of aortic stenosis. 14. The pulmonic valve was grossly normal. Pulmonic valve regurgitation is not visualized. 15. TR signal is inadequate for assessing pulmonary artery systolic pressure. 16. A pacer wire is visualized in the RA and RV. 17. The inferior vena cava is normal in size with greater than 50% respiratory variability, suggesting right atrial pressure of 3 mmHg. 18. A prior study was performed on 08/12/2018. 19. No change from prior study. FINDINGS  Left Ventricle: Left ventricular ejection fraction, by visual estimation, is 25 to 30%. The left ventricle has severely decreased function. The left ventricle demonstrates global hypokinesis. The left ventricular internal cavity size was mildly dilated left ventricle. There is no left ventricular hypertrophy. No obvious LV thrombus seen on this exam. Would recommend a limited contrast study to effectively  rule this out given the patient's history of stroke.  LV Wall Scoring: The basal and mid inferolateral wall and apical inferior segment are akinetic. Right Ventricle: The right ventricular size is normal. No increase in right ventricular wall thickness. Global RV systolic function is has normal systolic function. Left Atrium: Left atrial size was normal in size. Right Atrium: Right atrial size was normal in size Pericardium: There is no evidence of pericardial effusion. Presence of pericardial fat pad. Mitral Valve: The mitral valve is degenerative in appearance. Mild mitral annular calcification. Mild mitral valve regurgitation. Tricuspid Valve: The tricuspid valve is grossly normal. Tricuspid valve regurgitation is trivial. Aortic Valve: The aortic valve is tricuspid. Aortic valve regurgitation is not visualized. Mild to moderate aortic valve sclerosis/calcification is  present, without any evidence of aortic stenosis. Pulmonic Valve: The pulmonic valve was grossly normal. Pulmonic valve regurgitation is not visualized. Pulmonic regurgitation is not visualized. Aorta: The aortic root is normal in size and structure. Venous: The inferior vena cava is normal in size with greater than 50% respiratory variability, suggesting right atrial pressure of 3 mmHg. IAS/Shunts: No atrial level shunt detected by color flow Doppler. Additional Comments: A prior study was performed on 08/12/2018. A pacer wire is visualized in the right atrium and right ventricle.  LEFT VENTRICLE PLAX 2D LVIDd:         5.90 cm  Diastology LVIDs:         5.20 cm  LV e' lateral:   6.50 cm/s LV PW:         0.90 cm  LV E/e' lateral: 7.4 LV IVS:        1.00 cm  LV e' medial:    4.75 cm/s LVOT diam:     2.10 cm  LV E/e' medial:  10.1 LV SV:         44 ml LV SV Index:   23.26 LVOT Area:     3.46 cm  RIGHT VENTRICLE TAPSE (M-mode): 1.2 cm LEFT ATRIUM             Index       RIGHT ATRIUM           Index LA diam:        4.20 cm 2.27 cm/m  RA Area:     12.70  cm LA Vol (A2C):   67.1 ml 36.23 ml/m RA Volume:   29.30 ml  15.82 ml/m LA Vol (A4C):   52.0 ml 28.08 ml/m LA Biplane Vol: 61.6 ml 33.26 ml/m   AORTA Ao Root diam: 3.70 cm MITRAL VALVE MV Area (PHT): 3.72 cm             SHUNTS MV PHT:        59.16 msec           Systemic Diam: 2.10 cm MV Decel Time: 204 msec MV E velocity: 48.00 cm/s 103 cm/s MV A velocity: 36.00 cm/s 70.3 cm/s MV E/A ratio:  1.33       1.5  Eleonore Chiquito MD Electronically signed by Eleonore Chiquito MD Signature Date/Time: 05/27/2019/6:07:40 PM    Final       LOS: 3 days   Time spent: More than 50% of that time was spent in counseling and/or coordination of care.  Bonnell Public, M.D. [facility]  05/29/2019, 7:40 AM

## 2019-05-29 NOTE — Progress Notes (Signed)
Inpatient Rehab Admissions:  Inpatient Rehab Consult received.  I met with patient at the bedside for rehabilitation assessment and to discuss goals and expectations of an inpatient rehab admission.  I also had a lengthy discussion with his wife on the phone about options for post-acute rehab.  She has health issues of her own (PAD, herniated discs, etc), and is concerned about the possibility of having to provide any physical assist for pt after a 2-3 week CIR stay.  We discussed option of SNF level rehab for longer term/slower placed rehab and she prefers this route.  Will let CSW know for placement and sign off.   Signed: Shann Medal, PT, DPT Admissions Coordinator [phone] 05/29/19  3:11 PM

## 2019-05-29 NOTE — Social Work (Signed)
CSW has sent referral through hub for pt SNF placement per pt wife request.   [facility] team will continue to follow.   Westley Hummer, MSW, [place] Work [phone]

## 2019-05-29 NOTE — [place] FL2 (Signed)
[place] MEDICAID FL2 LEVEL OF CARE SCREENING TOOL     IDENTIFICATION  Patient Name: Tristan Myers Birthdate: 1941/01/16 Sex: male Admission Date (Current Location): 05/26/2019  [facility] and [place] Number:  Producer, television/film/video and Address:  The . [facility], [address] [place], [place] [address]      Provider Number: [id number]  Attending Physician Name and Address:  Lanae Boast, MD  Relative Name and Phone Number:       Current Level of Care: Hospital Recommended Level of Care: Skilled Nursing Facility Prior Approval Number:    Date Approved/Denied:   PASRR Number: [id number]  Discharge Plan: SNF    Current Diagnoses: Patient Active Problem List   Diagnosis Date Noted  . 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 DNR (do not resuscitate) present on admission 05/27/2019  . Pressure injury of skin 05/27/2019  . History of hemorrhagic cerebrovascular accident (CVA) with residual deficit 05/26/2019  . CVA (cerebral vascular accident) (HCC) 05/26/2019  . CVA (cerebrovascular accident) (HCC) 05/26/2019  . 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 ICD (implantable cardioverter-defibrillator)[employer]   . Atrial fibrillation and flutter (HCC)   . Ventricular tachycardia (HCC)   . CAD (coronary artery disease)   . Ischemic cardiomyopathy 01/02/2011  . 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 VENTRICULAR TACHYCARDIA 04/18/2010    Orientation RESPIRATION BLADDER Height & Weight     Self  Normal Incontinent, External catheter Weight:   Height:     BEHAVIORAL SYMPTOMS/MOOD NEUROLOGICAL BOWEL NUTRITION STATUS      Incontinent Diet(see discharge summary)  AMBULATORY STATUS COMMUNICATION OF NEEDS Skin   Extensive  Assist Verbally Skin abrasions, Other (Comment)(blister on buttocks and sacrum; generalized ecchymosis arms and legs;)                       Personal Care Assistance Level of Assistance  Feeding, Dressing, Bathing Bathing Assistance: Maximum assistance Feeding assistance: Independent Dressing Assistance: Maximum assistance     Functional Limitations Info  Sight, Hearing, Speech Sight Info: Adequate Hearing Info: Adequate Speech Info: Impaired    SPECIAL CARE FACTORS FREQUENCY  OT (By licensed OT), PT (By licensed PT)     PT Frequency: 5x week OT Frequency: 5x week            Contractures Contractures Info: Not present    Additional Factors Info  Code Status, Allergies, Insulin Sliding Scale Code Status Info: DNR Allergies Info: Ace Inhibitors, Betadine (Povidone Iodine), Warfarin And Related   Insulin Sliding Scale Info: insulin glargine (LANTUS) injection 20 Units daily at bedtime; insulin aspart (novoLOG) injection 0-15 Units 3x daily with meals       Current Medications (05/29/2019):  This is the current hospital active medication list Current Facility-Administered Medications  Medication Dose Route Frequency Provider Last Rate Last Admin  . 0.9 %  sodium chloride infusion   Intravenous Continuous Norins, Rosalyn Gess, MD 50 mL/hr at 05/29/19 1200 Rate Verify at 05/29/19 1200  . acetaminophen (TYLENOL) tablet 650 mg  650 mg Oral Q4H PRN Norins, Rosalyn Gess, MD       Or  . acetaminophen (TYLENOL) 160 MG/5ML solution 650 mg  650 mg Per Tube Q4H PRN Norins, Rosalyn Gess, MD       Or  . acetaminophen (TYLENOL) suppository 650 mg  650  mg Rectal Q4H PRN Norins, Heinz Knuckles, MD      . aspirin EC tablet 81 mg  81 mg Oral Daily Kc, Ramesh, MD   81 mg at 05/29/19 1724  . insulin aspart (novoLOG) injection 0-15 Units  0-15 Units Subcutaneous TID WC Norins, Heinz Knuckles, MD   2 Units at 05/29/19 1725  . insulin glargine (LANTUS) injection 20 Units  20 Units Subcutaneous QHS Neena Rhymes, MD   20 Units at 05/28/19 2151  . metoprolol succinate (TOPROL-XL) 24 hr tablet 25 mg  25 mg Oral Daily Kc, Ramesh, MD   25 mg at 05/29/19 1047  . rosuvastatin (CRESTOR) tablet 10 mg  10 mg Oral Q M,W,F Norins, Heinz Knuckles, MD   10 mg at 05/29/19 1047  . senna-docusate (Senokot-S) tablet 1 tablet  1 tablet Oral QHS PRN Norins, Heinz Knuckles, MD      . sodium chloride flush (NS) 0.9 % injection 3 mL  3 mL Intravenous Once Tegeler, Gwenyth Allegra, MD      . sotalol (BETAPACE) tablet 120 mg  120 mg Oral BID Norins, Heinz Knuckles, MD   120 mg at 05/29/19 1047     Discharge Medications: Please see discharge summary for a list of discharge medications.  Relevant Imaging Results:  Relevant Lab Results:   Additional Information [id number] [place], [place]

## 2019-05-29 NOTE — Care Management Important Message (Signed)
Important Message  Patient Details  Name: Tristan Myers MRN: [id number] Date of Birth: 1941/05/19   Medicare Important Message Given:  Yes     Orbie Pyo 05/29/2019, 2:04 PM

## 2019-05-30 LAB — BASIC METABOLIC PANEL
Anion gap: 11 (ref 5–15)
BUN: 32 mg/dL — ABNORMAL HIGH (ref 8–23)
CO2: 23 mmol/L (ref 22–32)
Calcium: 8.6 mg/dL — ABNORMAL LOW (ref 8.9–10.3)
Chloride: 109 mmol/L (ref 98–111)
Creatinine, Ser: 1.24 mg/dL (ref 0.61–1.24)
GFR calc Af Amer: >60 mL/min (ref >60)
GFR calc non Af Amer: 56 mL/min — ABNORMAL LOW (ref >60)
Glucose, Bld: 148 mg/dL — ABNORMAL HIGH (ref 70–99)
Potassium: 3 mmol/L — ABNORMAL LOW (ref 3.5–5.1)
Sodium: 143 mmol/L (ref 135–145)

## 2019-05-30 LAB — CBC
HCT: 39 % (ref 39.0–52.0)
Hemoglobin: 12.9 g/dL — ABNORMAL LOW (ref 13.0–17.0)
MCH: 31.9 pg (ref 26.0–34.0)
MCHC: 33.1 g/dL (ref 30.0–36.0)
MCV: 96.3 fL (ref 80.0–100.0)
Platelets: 156 10*3/uL (ref 150–400)
RBC: 4.05 MIL/uL — ABNORMAL LOW (ref 4.22–5.81)
RDW: 13 % (ref 11.5–15.5)
WBC: 9.5 10*3/uL (ref 4.0–10.5)
nRBC: 0 % (ref 0.0–0.2)

## 2019-05-30 LAB — GLUCOSE, CAPILLARY
Glucose-Capillary: 143 mg/dL — ABNORMAL HIGH (ref 70–99)
Glucose-Capillary: 123 mg/dL — ABNORMAL HIGH (ref 70–99)
Glucose-Capillary: 134 mg/dL — ABNORMAL HIGH (ref 70–99)
Glucose-Capillary: 147 mg/dL — ABNORMAL HIGH (ref 70–99)

## 2019-05-31 ENCOUNTER — Inpatient Hospital Stay (HOSPITAL_COMMUNITY): Payer: Medicare Other

## 2019-05-31 DIAGNOSIS — N179 Acute kidney failure, unspecified: Secondary | ICD-10-CM

## 2019-05-31 DIAGNOSIS — R338 Other retention of urine: Secondary | ICD-10-CM

## 2019-05-31 LAB — BASIC METABOLIC PANEL
Anion gap: 15 (ref 5–15)
BUN: 28 mg/dL — ABNORMAL HIGH (ref 8–23)
CO2: 25 mmol/L (ref 22–32)
Calcium: 9.1 mg/dL (ref 8.9–10.3)
Chloride: 106 mmol/L (ref 98–111)
Creatinine, Ser: 1.31 mg/dL — ABNORMAL HIGH (ref 0.61–1.24)
GFR calc Af Amer: >60 mL/min (ref >60)
GFR calc non Af Amer: 53 mL/min — ABNORMAL LOW (ref >60)
Glucose, Bld: 159 mg/dL — ABNORMAL HIGH (ref 70–99)
Potassium: 3 mmol/L — ABNORMAL LOW (ref 3.5–5.1)
Sodium: 146 mmol/L — ABNORMAL HIGH (ref 135–145)

## 2019-05-31 LAB — GLUCOSE, CAPILLARY
Glucose-Capillary: 151 mg/dL — ABNORMAL HIGH (ref 70–99)
Glucose-Capillary: 217 mg/dL — ABNORMAL HIGH (ref 70–99)
Glucose-Capillary: 148 mg/dL — ABNORMAL HIGH (ref 70–99)
Glucose-Capillary: 154 mg/dL — ABNORMAL HIGH (ref 70–99)

## 2019-05-31 LAB — RENAL FUNCTION PANEL
Albumin: 3 g/dL — ABNORMAL LOW (ref 3.5–5.0)
Anion gap: 13 (ref 5–15)
BUN: 27 mg/dL — ABNORMAL HIGH (ref 8–23)
CO2: 23 mmol/L (ref 22–32)
Calcium: 8.9 mg/dL (ref 8.9–10.3)
Chloride: 108 mmol/L (ref 98–111)
Creatinine, Ser: 0.89 mg/dL (ref 0.61–1.24)
GFR calc Af Amer: >60 mL/min (ref >60)
GFR calc non Af Amer: >60 mL/min (ref >60)
Glucose, Bld: 179 mg/dL — ABNORMAL HIGH (ref 70–99)
Phosphorus: 3.8 mg/dL (ref 2.5–4.6)
Potassium: 3.3 mmol/L — ABNORMAL LOW (ref 3.5–5.1)
Sodium: 144 mmol/L (ref 135–145)

## 2019-05-31 LAB — CBC
HCT: 40.7 % (ref 39.0–52.0)
Hemoglobin: 13.6 g/dL (ref 13.0–17.0)
MCH: 31.9 pg (ref 26.0–34.0)
MCHC: 33.4 g/dL (ref 30.0–36.0)
MCV: 95.5 fL (ref 80.0–100.0)
Platelets: 168 10*3/uL (ref 150–400)
RBC: 4.26 MIL/uL (ref 4.22–5.81)
RDW: 12.9 % (ref 11.5–15.5)
WBC: 11.1 10*3/uL — ABNORMAL HIGH (ref 4.0–10.5)
nRBC: 0 % (ref 0.0–0.2)

## 2019-05-31 LAB — URINALYSIS, ROUTINE W REFLEX MICROSCOPIC: RBC / HPF: >50 RBC/hpf — ABNORMAL HIGH (ref 0–5)

## 2019-05-31 LAB — PROTEIN / CREATININE RATIO, URINE
Creatinine, Urine: 68.34 mg/dL
Protein Creatinine Ratio: 14.71 mg/mg{Cre} — ABNORMAL HIGH (ref 0.00–0.15)
Total Protein, Urine: 1005 mg/dL

## 2019-05-31 LAB — MAGNESIUM: Magnesium: 1.9 mg/dL (ref 1.7–2.4)

## 2019-05-31 MED ORDER — POTASSIUM CHLORIDE 10 MEQ/100ML IV SOLN
10.0000 meq | INTRAVENOUS | Status: AC
  Administered 2019-05-31: 10 meq via INTRAVENOUS
  Filled 2019-05-31: qty 100

## 2019-05-31 MED ORDER — TAMSULOSIN HCL 0.4 MG PO CAPS
0.4000 mg | ORAL_CAPSULE | Freq: Every day | ORAL | Status: DC
  Administered 2019-05-31 – 2019-06-02 (×3): 0.4 mg via ORAL
  Filled 2019-05-31 (×3): qty 1

## 2019-05-31 MED ORDER — CHLORHEXIDINE GLUCONATE CLOTH 2 % EX PADS
6.0000 | MEDICATED_PAD | Freq: Every day | CUTANEOUS | Status: DC
  Administered 2019-05-31 – 2019-06-02 (×3): 6 via TOPICAL

## 2019-05-31 MED ORDER — POTASSIUM CHLORIDE 10 MEQ/100ML IV SOLN
10.0000 meq | INTRAVENOUS | Status: AC
  Administered 2019-05-31 (×3): 10 meq via INTRAVENOUS
  Filled 2019-05-31 (×3): qty 100

## 2019-05-31 NOTE — Progress Notes (Signed)
Blood in patient's urine, Dr. Marthenia Rolling notified.

## 2019-05-31 NOTE — TOC Initial Note (Signed)
Transition of Care [facility]) - Initial/Assessment Note    Patient Details  Name: Tristan Myers MRN: [id number] Date of Birth: Nov 24, 1940  Transition of Care [facility]) CM/SW Contact:    Gelene Mink, [place] Phone Number: 05/31/2019, 10:20 AM  Clinical Narrative:                  CSW contacted patient's wife, Carliss Quast. CSW introduced herself, explained her role, and shared therapy recommendation. Patient's wife is agreeable to rehab, she is not able to care for him at home. She stated she has two herniated disks in her back and is in poor health. She had a concern about being able to afford it. CSW explained it would be filed under [facility] CSW explained SNF process.   CSW provided current bed offers. Patient's wife first preference is [facility] in [employer]. CSW explained if [facility] didn't offer, she would need to pick from current bed offers.   CSW submitted clinicals to [place] [facility] to start insurance authorization.   Expected Discharge Plan: Skilled Nursing Facility Barriers to Discharge: Continued Medical Work up, Ship broker   Patient Goals and CMS Choice Patient states their goals for this hospitalization and ongoing recovery are:: Pt's wife wants him to get better CMS Medicare.gov Compare Post Acute Care list provided to:: Patient Represenative (must comment) Choice offered to / list presented to : Spouse  Expected Discharge Plan and Services Expected Discharge Plan: [place] In-house Referral: Clinical Social Work Discharge Planning Services: NA Post Acute Care Choice: [place] Living arrangements for the past 2 months: Single Family Home                 DME Arranged: N/A DME Agency: NA       HH Arranged: NA HH Agency: NA        Prior Living Arrangements/Services Living arrangements for the past 2 months: [place] Lives with:: Spouse Patient language and need for interpreter reviewed:: No Do you  feel safe going back to the place where you live?: No   Wife cannot take of him at home  Need for Family Participation in Patient Care: Yes (Comment) Care giver support system in place?: Yes (comment)   Criminal Activity/Legal Involvement Pertinent to Current Situation/Hospitalization: No - Comment as needed  Activities of Daily Living      Permission Sought/Granted Permission sought to share information with : Case Manager Permission granted to share information with : Yes, Verbal Permission Granted  Share Information with NAME: Vaughan Basta  Permission granted to share info w AGENCY: All SNF  Permission granted to share info w Relationship: Wife     Emotional Assessment Appearance:: Appears stated age Attitude/Demeanor/Rapport: Unable to Assess Affect (typically observed): Unable to Assess Orientation: : Oriented to Self, Oriented to Place, Oriented to  Time Alcohol / Substance Use: Not Applicable Psych Involvement: No (comment)  Admission diagnosis:  Cerebrovascular accident (CVA), unspecified mechanism ([place]) [I63.9] AMS (altered mental status) [R41.82] CVA (cerebral vascular accident) [facility]) [I63.9] Patient Active Problem List   Diagnosis Date Noted  . 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 DNR (do not resuscitate) present on admission 05/27/2019  . Pressure injury of skin 05/27/2019  . History of hemorrhagic cerebrovascular accident (CVA) with residual deficit 05/26/2019  . CVA (cerebral vascular accident) ([place]) 05/26/2019  . CVA (cerebrovascular accident) ([place]) 05/26/2019  . 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 Inappropriate shocks  from ICD (implantable cardioverter-defibrillator)   . ICD (implantable cardioverter-defibrillator)[employer]   . Atrial fibrillation and flutter (HCC)   . Ventricular tachycardia (HCC)   . CAD (coronary artery disease)   . Ischemic cardiomyopathy 01/02/2011  .  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 VENTRICULAR TACHYCARDIA 04/18/2010   PCP:  Kaleen Mask, MD Pharmacy:   [facility] [address] [place]), [place] - [address] [place] [address] Phone: [phone] Fax: [phone]  [facility] Pharmacy - [place], [place] - [address] [place] [address] Phone: [phone] Fax: [phone]  [facility] - [place], [place] - [address] [facility] [address] [place] [address] [place] [address] Phone: [phone] Fax: [phone]     Social Determinants of Health (SDOH) Interventions    Readmission Risk Interventions No flowsheet data found.

## 2019-05-31 NOTE — Progress Notes (Signed)
PROGRESS NOTE    Tristan Myers  WNI:[id number] DOB: 02-Sep-1940 DOA: 05/26/2019 PCP: Leonard Downing, MD   Brief Narrative: 78 year old male with history of AAA fever on warfarin previously but not currently, history of previous strokes left MCA embolic stroke in 3818 was started on Coumadin but refused to follow-up and admitted a month later with hemorrhagic conversion-and, and was discontinued, was kept on aspirin, history of CAD status post CABG, DM, HLD, PVD, history of ICD placement presented from home with 4-day history of altered mental status, speech and gait abnormalities.  CT Angio/Neck revealed "acute infarct right superior cerebellum with hemorrhagic transformation unchanged.  20% diameter stenosis proximal right internal carotid artery and 35% diameter stenosis proximal left internal carotid artery.  Left vertebral artery dominant. Mild calcific stenosis at the origin of the left vertebral artery.  No significant intracranial stenosis".  Patient is currently on aspirin.  05/31/2019: Patient seen.  Noted to be in urinary retention.  Foley catheter was placed and about 1000 cc of urine drained.  Will start patient on flomax, and patient will likely follow up with Urology on discharge.  Patient is awaiting skilled nursing facility placement.  Pursue disposition when a bed is available.  Subjective: No complaints. No new motor symptoms or speech problems according to patient Urinary retention as documented above  Assessment & Plan: Acute right superior cerebellar infarct with hemorrhage:  - Patient is currently on aspirin. -Unable to obtain MRI due to AICD.  -EF 25 to 30% no definite clot noted -LDL 108, hemoglobin A1c 7.3 -PT/OT input is appreciated. -Patient is awaiting skilled nursing facility placement. -Neurology input is appreciated.  Dementia: -Patient has continued to decline.   -No behavioral problems.   Acute kidney injury:  -Baseline serum creatinine is 0.7.     -Serum creatinine on current Admission peaked at 1.89  -Serum creatinine today is 1.31  -Etiology is likely multifactorial, including volume depletion, exposure to contrast dye, low EF and possible bladder outlet obstruction.  Patient is also diabetic, and UA done on 05/26/2019 revealed some protein, with specific gravity of 1.018. -Repeat UA, urine protein and creatinine ratio and renal ultrasound. -Optimize volume, but cautiously -Further management depend on hospital course. Recent Labs  Lab 05/26/19 0856 05/28/19 0417 05/29/19 0220 05/30/19 0424 05/31/19 0222  BUN 15 33* 35* 32* 28*  CREATININE 0.60* 1.89* 1.67* 1.24 1.31*   History of stroke x2: 01/2010 - L MCA infarct w/ hemorrhagic transformation in setting of INR 4.16. refused to go to f/u MD or therapy appts but did take meds as prescribed after stroke in July 2011. INR reversed in subsequent admission in August for infarct with hemorrhage- and decision made not to continue [facility] and kept on aspirin.  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  Cont metoprolol and sotalol. Similarly reduced EF IN ECHO  Hypertension:  -Reasonably controlled. -Continue sotalol  -Discontinue low-dose metoprolol (25 mg orally once daily)  -Start Flomax (mainly for likely bladder outlet obstruction, will also help with blood pressure control) -Home losartan on hold.  Atrial fibrillation and flutter: -Continue sotalol  -Patient is not a candidate for anticoagulation  -Followed by cardiologist, Dr. Caryl Comes.  Diabetes mellitus type 2: -Continue sliding scale insulin coverage -Continue to monitor closely  Hyperlipidemia:  -Continue Crestor.    DNR: present on admission  Incontinence of urine/bowel for 3 months  And mostly bedbound barely able get to bathroom with walker (as per  collateral information)             .   Pressure Ulcer: Pressure Injury 05/27/19  Buttocks Right;Left Stage II -  Partial thickness loss of dermis presenting as a shallow open ulcer with a red, pink wound bed without slough. 2 dime size open areas with no drainage noted (Active)  05/27/19 1623  Location: Buttocks  Location Orientation: Right;Left  Staging: Stage II -  Partial thickness loss of dermis presenting as a shallow open ulcer with a red, pink wound bed without slough.  Wound Description (Comments): 2 dime size open areas with no drainage noted  Present on Admission: Yes   There is no height or weight on file to calculate BMI.   DVT prophylaxis: SCD no chemical prophylaxis due to hemorrhagic conversion risk Code Status: DNR Family Communication: plan of care discussed with patient's wife-she reports inability to care for him and looking into CIR Disposition Plan: Remains inpatient pending clinical improvement. Awaiting CIR.  Consultants: Neurology. Procedures: MRI brain, carotid ultrasound,   CT head CTA head and neck 1. Acute infarct right superior cerebellum with hemorrhagic transformation unchanged. 2. 20% diameter stenosis proximal right internal carotid artery and 35% diameter stenosis proximal left internal carotid artery. 3. Left vertebral artery dominant. Mild calcific stenosis at the origin of the left vertebral artery. 4. No significant intracranial stenosis.  TTE 05/27/19   1. Left ventricular ejection fraction, by visual estimation, is 25 to 30%. The left ventricle has severely decreased function. There is no left ventricular hypertrophy.  2. No obvious LV thrombus seen on this exam. Would recommend a limited contrast study to effectively rule this out given the patient's history of stroke.  3. Basal and mid inferolateral wall and apical inferior segment are abnormal.  4. Mildly dilated left ventricular internal cavity size.  5. The left ventricle demonstrates global hypokinesis.  6. Global right ventricle has normal systolic function.The  right ventricular size is normal. No increase in right ventricular wall thickness.  7. Left atrial size was normal.  8. Right atrial size was normal.  9. Presence of pericardial fat pad. 10. Mild mitral annular calcification. 11. The mitral valve is degenerative. Mild mitral valve regurgitation. 12. The tricuspid valve is grossly normal. Tricuspid valve regurgitation is trivial. 13. The aortic valve is tricuspid. Aortic valve regurgitation is not visualized. Mild to moderate aortic valve sclerosis/calcification without any evidence of aortic stenosis. 14. The pulmonic valve was grossly normal. Pulmonic valve regurgitation is not visualized. 15. TR signal is inadequate for assessing pulmonary artery systolic pressure. 16. A pacer wire is visualized in the RA and RV. 17. The inferior vena cava is normal in size with greater than 50% respiratory variability, suggesting right atrial pressure of 3 mmHg. 18. A prior study was performed on 08/12/2018. 19. No change from prior study. TTE Microbiology: COVID-19 negative  Antimicrobials: Anti-infectives (From admission, onward)   None       Objective: Vitals:   05/30/19 2328 05/31/19 0336 05/31/19 0835 05/31/19 0846  BP: (!) 136/52 130/67 (!) 187/120 (!) 149/94  Pulse: 60 68 75 60  Resp: 18 18 15    Temp: 98.1 F (36.7 C) 98 F (36.7 C) 97.7 F (36.5 C)   TempSrc: Oral Oral Oral   SpO2: 98% 98% 100%     Intake/Output Summary (Last 24 hours) at 05/31/2019 0952 Last data filed at 05/31/2019 0600 Gross per 24 hour  Intake 200 ml  Output 350 ml  Net -150 ml   There  were no vitals filed for this visit. Weight change:   There is no height or weight on file to calculate BMI.  Intake/Output from previous day: 12/12 0701 - 12/13 0700 In: 200 [P.O.:200] Out: 950 [Urine:950] Intake/Output this shift: No intake/output data recorded.  Examination:  General exam: Alert and awake.  Not in any obvious distress.  HEENT: Mild pallor.  No  jaundice Respiratory system: Clear to auscultation. Cardiovascular system: S1 & S2 +, ?irregular. Gastrointestinal system: Abdomen soft, NT,ND, BS+ Nervous System: Awake and alert. Dysarthric. Moves all extremities Extremities: No edema   Medications:  Scheduled Meds: . aspirin EC  81 mg Oral Daily  . insulin aspart  0-15 Units Subcutaneous TID WC  . insulin glargine  20 Units Subcutaneous QHS  . metoprolol succinate  25 mg Oral Daily  . rosuvastatin  10 mg Oral Q M,W,F  . sodium chloride flush  3 mL Intravenous Once  . sotalol  120 mg Oral BID   Continuous Infusions: . sodium chloride Stopped (05/29/19 2210)  . potassium chloride      Data Reviewed: I have personally reviewed following labs and imaging studies  CBC: Recent Labs  Lab 05/26/19 0839 05/26/19 0856 05/28/19 0417 05/29/19 0220 05/30/19 0424 05/31/19 0222  WBC 11.3*  --  16.0* 13.5* 9.5 11.1*  NEUTROABS 8.4*  --   --   --   --   --   HGB 13.9 13.9 14.3 13.8 12.9* 13.6  HCT 41.3 41.0 42.3 40.1 39.0 40.7  MCV 96.0  --  95.5 94.6 96.3 95.5  PLT 168  --  178 170 156 168   Basic Metabolic Panel: Recent Labs  Lab 05/26/19 0839 05/26/19 0856 05/28/19 0417 05/29/19 0220 05/30/19 0424 05/31/19 0222  NA 137 137 140 144 143 146*  K 4.6 4.5 3.8 3.6 3.0* 3.0*  CL 101 103 105 110 109 106  CO2 23  --  21* GLUCOSE 188* 188* 161* 157* 148* 159*  BUN 14 15 33* 35* 32* 28*  CREATININE 0.74 0.60* 1.89* 1.67* 1.24 1.31*  CALCIUM 9.1  --  8.9 9.1 8.6* 9.1   GFR: CrCl cannot be calculated (Unknown ideal weight.). Liver Function Tests: Recent Labs  Lab 05/26/19 0839  AST 24  ALT 31  ALKPHOS 58  BILITOT 1.4*  PROT 7.4  ALBUMIN 3.8   No results for input(s): LIPASE, AMYLASE in the last 168 hours. No results for input(s): AMMONIA in the last 168 hours. Coagulation Profile: Recent Labs  Lab 05/26/19 0839  INR 1.0   Cardiac Enzymes: No results for input(s): CKTOTAL, CKMB, CKMBINDEX, TROPONINI  in the last 168 hours. BNP (last 3 results) No results for input(s): PROBNP in the last 8760 hours. HbA1C: No results for input(s): HGBA1C in the last 72 hours. CBG: Recent Labs  Lab 05/30/19 0649 05/30/19 1155 05/30/19 1736 05/30/19 2123 05/31/19 0606  GLUCAP 143* 123* 134* 147* 151*   Lipid Profile: No results for input(s): CHOL, HDL, LDLCALC, TRIG, CHOLHDL, LDLDIRECT in the last 72 hours. Thyroid Function Tests: No results for input(s): TSH, T4TOTAL, FREET4, T3FREE, THYROIDAB in the last 72 hours. Anemia Panel: No results for input(s): VITAMINB12, FOLATE, FERRITIN, TIBC, IRON, RETICCTPCT in the last 72 hours. Sepsis Labs: No results for input(s): PROCALCITON, LATICACIDVEN in the last 168 hours.  Recent Results (from the past 240 hour(s))  SARS CORONAVIRUS 2 (TAT 6-24 HRS) Nasopharyngeal Nasopharyngeal Swab     Status: None   Collection Time: 05/26/19  3:19  PM   Specimen: Nasopharyngeal Swab  Result Value Ref Range Status   SARS Coronavirus 2 NEGATIVE NEGATIVE Final    Comment: (NOTE) SARS-CoV-2 target nucleic acids are NOT DETECTED. The SARS-CoV-2 RNA is generally detectable in upper and lower respiratory specimens during the acute phase of infection. Negative results do not preclude SARS-CoV-2 infection, do not rule out co-infections with other pathogens, and should not be used as the sole basis for treatment or other patient management decisions. Negative results must be combined with clinical observations, patient history, and epidemiological information. The expected result is Negative. Fact Sheet for Patients: HairSlick.no Fact Sheet for Healthcare Providers: quierodirigir.com This test is not yet approved or cleared by the [place] FDA and  has been authorized for detection and/or diagnosis of SARS-CoV-2 by FDA under an Emergency Use Authorization (EUA). This EUA will remain  in effect (meaning this test  can be used) for the duration of the COVID-19 declaration under Section 56 4(b)(1) of the Act, 21 U.S.C. section 360bbb-3(b)(1), unless the authorization is terminated or revoked sooner. Performed at [facility], [address] [place], [place] [address]       Radiology Studies: No results found.    LOS: 5 days   Barnetta Chapel, M.D. [facility]  05/31/2019, 9:52 AM

## 2019-06-01 LAB — GLUCOSE, CAPILLARY
Glucose-Capillary: 140 mg/dL — ABNORMAL HIGH (ref 70–99)
Glucose-Capillary: 129 mg/dL — ABNORMAL HIGH (ref 70–99)
Glucose-Capillary: 186 mg/dL — ABNORMAL HIGH (ref 70–99)
Glucose-Capillary: 174 mg/dL — ABNORMAL HIGH (ref 70–99)

## 2019-06-01 LAB — MAGNESIUM: Magnesium: 1.6 mg/dL — ABNORMAL LOW (ref 1.7–2.4)

## 2019-06-01 LAB — RENAL FUNCTION PANEL
Albumin: 2.9 g/dL — ABNORMAL LOW (ref 3.5–5.0)
Anion gap: 11 (ref 5–15)
BUN: 25 mg/dL — ABNORMAL HIGH (ref 8–23)
CO2: 24 mmol/L (ref 22–32)
Calcium: 8.8 mg/dL — ABNORMAL LOW (ref 8.9–10.3)
Chloride: 109 mmol/L (ref 98–111)
Creatinine, Ser: 0.78 mg/dL (ref 0.61–1.24)
GFR calc Af Amer: >60 mL/min (ref >60)
GFR calc non Af Amer: >60 mL/min (ref >60)
Glucose, Bld: 142 mg/dL — ABNORMAL HIGH (ref 70–99)
Phosphorus: 3.8 mg/dL (ref 2.5–4.6)
Potassium: 3.3 mmol/L — ABNORMAL LOW (ref 3.5–5.1)
Sodium: 144 mmol/L (ref 135–145)

## 2019-06-01 LAB — SARS CORONAVIRUS 2 (TAT 6-24 HRS): SARS Coronavirus 2: NEGATIVE

## 2019-06-01 MED ORDER — POTASSIUM CHLORIDE CRYS ER 20 MEQ PO TBCR
40.0000 meq | EXTENDED_RELEASE_TABLET | Freq: Once | ORAL | Status: AC
  Administered 2019-06-01: 40 meq via ORAL

## 2019-06-01 NOTE — Progress Notes (Signed)
Physical Therapy Treatment Patient Details Name: Tristan Myers MRN: [id number] DOB: 02/27/1941 Today's Date: 06/01/2019    History of Present Illness ZAKAREE MCCLENAHAN is a 78 y.o. 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    PT Comments    PTA returned to assist patient back to bed as nurse tech required assistance.  He was more lethargic and required increased assistance.  Pt continues to benefit from snf placement as his ability to transfer varries between +1 and +2 assistance.     Follow Up Recommendations  Supervision/Assistance - 24 hour;SNF     Equipment Recommendations  Wheelchair (measurements PT)    Recommendations for Other Services Rehab consult     Precautions / Restrictions Precautions Precautions: Fall Restrictions Weight Bearing Restrictions: No    Mobility  Bed Mobility Overal bed mobility: Needs Assistance Bed Mobility: Sit to Supine      Sit to supine: Mod assist;+2 for physical assistance   General bed mobility comments: Increased assistance to return back to bed.  Transfers Overall transfer level: Needs assistance  Transfers: Squat Pivot Transfers   Squat pivot transfers: Max assist;+2 physical assistance     General transfer comment: Pt unable to achieve standing to stedy after 3 trials.  Pt required +2 max pivot from chair back to bed.  Pt very lethargic this pm.  Ambulation/Gait       Stairs             Wheelchair Mobility    Modified Rankin (Stroke Patients Only) Modified Rankin (Stroke Patients Only) Pre-Morbid Rankin Score: No significant disability Modified Rankin: Moderately severe disability     Balance Overall balance assessment: Needs assistance   Sitting balance-Leahy Scale: Poor        Standing balance-Leahy Scale: Zero                              Cognition Arousal/Alertness: Awake/alert Behavior During Therapy: Impulsive Overall Cognitive Status: History of cognitive impairments - at baseline                   Orientation Level: Disoriented to;Situation;Time Current Attention Level: Focused   Following Commands: Follows one step commands inconsistently Safety/Judgement: Decreased awareness of safety;Decreased awareness of deficits     General Comments: Pt with unintelligble speech;Able to follow motor/automatic tasks >50% of the time      Exercises      General Comments        Pertinent Vitals/Pain Pain Assessment: Faces Faces Pain Scale: No hurt    Home Living                      Prior Function            PT Goals (current goals can now be found in the care plan section) Acute Rehab PT Goals Patient Stated Goal: unable to state Potential to Achieve Goals: Fair Progress towards PT goals: Progressing toward goals    Frequency    Min 3X/week      PT Plan Current plan remains appropriate    Co-evaluation              AM-PAC PT "6 Clicks" Mobility   Outcome Measure  Help needed turning  from your back to your side while in a flat bed without using bedrails?: A Little Help needed moving from lying on your back to sitting on the side of a flat bed without using bedrails?: A Little Help needed moving to and from a bed to a chair (including a wheelchair)?: A Lot Help needed standing up from a chair using your arms (e.g., wheelchair or bedside chair)?: A Lot Help needed to walk in hospital room?: A Lot Help needed climbing 3-5 steps with a railing? : Total 6 Click Score: 13    End of Session Equipment Utilized During Treatment: Gait belt Activity Tolerance: Patient limited by lethargy;Patient limited by fatigue Patient left: in bed;with nursing/sitter in room(left supine in bed with nurse tech  present.) Nurse Communication: Mobility status PT Visit Diagnosis: Other abnormalities of gait and mobility (R26.89);Other symptoms and signs involving the nervous system (R29.898);Muscle weakness (generalized) (M62.81)     Time: 0034-9179 PT Time Calculation (min) (ACUTE ONLY): 10 min  Charges:   $Therapeutic Activity: 8-22 mins                     Bonney Leitz , PTA Acute Rehabilitation Services Pager [phone] Office [phone]     Whitley Strycharz Artis Delay 06/01/2019, 4:57 PM

## 2019-06-01 NOTE — Progress Notes (Addendum)
Physical Therapy Treatment Patient Details Name: Tristan Myers MRN: [id number] DOB: 1940-06-21 Today's Date: 06/01/2019    History of Present Illness Tristan Myers is a 78 y.o. 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    PT Comments    Pt performed gt training and functional mobility during session this am.  He remains to present with cognitive impairments and continues to require external assistance to mobilize.  Pt was declined CIR placement and will update recommendations to SNF at this time.  He wife is unable to care for him at home after rehab.  Plan for continued progression of functional mobility next session.  Will inform supervising PT of need for change in d/c recommendations at this time.    Follow Up Recommendations  Supervision/Assistance - 24 hour;SNF     Equipment Recommendations  Wheelchair (measurements PT)    Recommendations for Other Services Rehab consult     Precautions / Restrictions Precautions Precautions: Fall Restrictions Weight Bearing Restrictions: No    Mobility  Bed Mobility Overal bed mobility: Needs Assistance Bed Mobility: Supine to Sit     Supine to sit: Mod assist     General bed mobility comments: Pt able to move LEs to edge of bed but required assistance for trunk elevtion.  Once in sitting able to maintain balance unsupported.  Transfers Overall transfer level: Needs assistance Equipment used: Rolling walker (2 wheeled) Transfers: Sit to/from Stand Sit to Stand: Min assist Stand pivot transfers: Mod assist       General transfer comment: min assistance to stand and moderate assistance to maintain standing.  Ambulation/Gait Ambulation/Gait assistance: Max assist;Mod assist Gait Distance (Feet): 10  Feet Assistive device: Rolling walker (2 wheeled) Gait Pattern/deviations: Step-to pattern;Shuffle;Narrow base of support;Trunk flexed;Drifts right/left;Leaning posteriorly Gait velocity: decreased   General Gait Details: Pt very unsteady in standing.  During dynamic gt noted to push device too far forward and lean posteriorly.  Pt continues to required external assistance for short bout of gt training.   Stairs             Wheelchair Mobility    Modified Rankin (Stroke Patients Only) Modified Rankin (Stroke Patients Only) Pre-Morbid Rankin Score: No significant disability Modified Rankin: Moderately severe disability     Balance Overall balance assessment: Needs assistance   Sitting balance-Leahy Scale: Fair       Standing balance-Leahy Scale: Poor                              Cognition Arousal/Alertness: Awake/alert Behavior During Therapy: Impulsive Overall Cognitive Status: History of cognitive impairments - at baseline                   Orientation Level: Disoriented to;Situation;Time Current Attention Level: Focused   Following Commands: Follows one step commands inconsistently Safety/Judgement: Decreased awareness of safety;Decreased awareness of deficits     General Comments: Pt with unintelligble speech;Able to follow motor/automatic tasks >50% of the time      Exercises      General Comments        Pertinent Vitals/Pain Pain Assessment: Faces Faces Pain Scale: No hurt    Home Living  Prior Function            PT Goals (current goals can now be found in the care plan section) Acute Rehab PT Goals Patient Stated Goal: unable to state Potential to Achieve Goals: Fair Progress towards PT goals: Progressing toward goals    Frequency    Min 3X/week      PT Plan Discharge plan needs to be updated    Co-evaluation              AM-PAC PT "6 Clicks" Mobility   Outcome  Measure  Help needed turning from your back to your side while in a flat bed without using bedrails?: A Little Help needed moving from lying on your back to sitting on the side of a flat bed without using bedrails?: A Little Help needed moving to and from a bed to a chair (including a wheelchair)?: A Lot Help needed standing up from a chair using your arms (e.g., wheelchair or bedside chair)?: A Lot Help needed to walk in hospital room?: Total Help needed climbing 3-5 steps with a railing? : Total 6 Click Score: 12    End of Session Equipment Utilized During Treatment: Gait belt Activity Tolerance: Patient limited by fatigue Patient left: with call bell/phone within reach;in chair;with chair alarm set Nurse Communication: Mobility status PT Visit Diagnosis: Other abnormalities of gait and mobility (R26.89);Other symptoms and signs involving the nervous system (R29.898);Muscle weakness (generalized) (M62.81)     Time: 1751-0258 PT Time Calculation (min) (ACUTE ONLY): 19 min  Charges:  $Gait Training: 8-22 mins                     Bonney Leitz , PTA Acute Rehabilitation Services Pager [phone] Office [phone]     Phiona Ramnauth Artis Delay 06/01/2019, 3:26 PM

## 2019-06-01 NOTE — Progress Notes (Signed)
PROGRESS NOTE    Tristan Myers  QQI:[id number] DOB: [birth year] DOA: 05/26/2019 PCP: Kaleen Mask, MD   Brief Narrative: 78 year old male with history of AAA fever on warfarin previously but not currently, history of previous strokes left MCA embolic stroke in 2011 was started on Coumadin but refused to follow-up and admitted a month later with hemorrhagic conversion-and, and was discontinued, was kept on aspirin, history of CAD status post CABG, DM, HLD, PVD, history of ICD placement presented from home with 4-day history of altered mental status, speech and gait abnormalities.  CT Angio/Neck revealed "acute infarct right superior cerebellum with hemorrhagic transformation unchanged.  20% diameter stenosis proximal right internal carotid artery and 35% diameter stenosis proximal left internal carotid artery.  Left vertebral artery dominant. Mild calcific stenosis at the origin of the left vertebral artery.  No significant intracranial stenosis".  Patient is currently on aspirin.  Patient overall overall stable.  Seen by neurology resume aspirin and signed off. 12/13 patient had urine retention Foley catheter placed also had hematuria placed on Flomax.  12/14 hematuria has resolved, Foley catheter in place.  Subjective: ALERT,AWAKE, dysarthric, able to tell me his name. 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 Completed stroke w/u with:CT head CT head and neck carotid Dopplers, 2D echo-EF 25 to 30% no definite clot.LDL 108 A1c 7.3.  Continue on aspirin as per neurology recommendation.  Unable to anticoagulate  Dementia with baseline speech issues/Dysphagia: secondary to stroke.seen by speech cleared for diet continue the same.  Continue aspiration precaution.wife mentioned that patient has had decline in past 3 months and they have taken over his finances  and medication.   Acute metabolic encephalopathy in the setting of stroke with residual patient confusion:  Mental status is stable at baseline dementia.`  AKI with creatinine 1.8, on admission 0.7.Suspect multifactorial with IV contrast use, decreased oral intake.improved. Recent Labs  Lab 05/29/19 0220 05/30/19 0424 05/31/19 0222 05/31/19 1636 06/01/19 0259  BUN 35* 32* 28* 27* 25*  CREATININE 1.67* 1.24 1.31* 0.89 0.78   HypoKalemia:replacement ordered.  Urine retention RRR clear/structural: continue Flomax, keep Foley in place.  He will need follow-up with urology upon discharge.  History of stroke x2: 01/2010 - L MCA infarct w/ hemorrhagic transformation in setting of INR 4.16. refused to go to f/u MD or therapy appts but did take meds as prescribed after stroke in July 2011. 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 lvef low at 25-305 like before  Hypertension: Blood pressure fairly controlled.  Continue Flomax mainly for bladder outlet obstruction and also to help with blood pressure control.  Home meds on hold for now.  Atrial fibrillation and flutter:Currently normal sinus rhythm.  Patient has been felt to be poor candidate- has appointment with Dr Graciela Husbands in Jan and will need to f/u with him and have Dr. Pearlean Brownie to discuss if he qualifies for DOAC anticoagulation in light of another stroke.  He has been followed by cardiology.  I again discussed Dr. Roda Shutters from neurology 12/14- at this time recommendation is  to continue aspirin and follow-up with his cardiology and neurology to discuss more.   Diabetes mellitus type 2 uncontrolled A1c 7.3,  goal less than 7: Blood sugar is controlled continue 20U Lantus and ssi.At home uses 10 units if 200,  And if>250 uses 20 units, uses twice a day.  HLD: On crestor 10 mg at home,cont, ldl goal <70, now at 108  DNR: present on admission  Incontinence of urine/bowel for 3 months  And mostly bedbound barely able get to bathroom with walker.  Pressure Ulcer: Pressure Injury 05/27/19 Buttocks Right;Left Stage II -  Partial thickness loss of dermis presenting as a shallow open ulcer with a red, pink wound bed without slough. 2 dime size open areas with no drainage noted (Active)  05/27/19 1623  Location: Buttocks  Location Orientation: Right;Left  Staging: Stage II -  Partial thickness loss of dermis presenting as a shallow open ulcer with a red, pink wound bed without slough.  Wound Description (Comments): 2 dime size open areas with no drainage noted  Present on Admission: Yes    There is no height or weight on file to calculate BMI.    DVT prophylaxis:SCD. no chemical prophylaxis due to petechial hemorrhage Code Status:DNR. Family Communica:Pt's wife aware about plan for rehabilitation.  Unable to take care of him at the home  Disposition Plan: SNF Tomorrow, covid ordered today.   Consultants:  Procedures: CT head CTA head and neck 1. Acute infarct right superior cerebellum with hemorrhagic transformation unchanged. 2. 20% diameter stenosis proximal right internal carotid artery and 35% diameter stenosis proximal left internal carotid artery. 3. Left vertebral artery dominant. Mild calcific stenosis at the origin of the left vertebral artery. 4. No significant intracranial stenosis.  TTE 05/27/19  1. Left ventricular ejection fraction, by visual estimation, is 25 to 30%. The left ventricle has severely decreased function. There is no left ventricular hypertrophy. 2. No obvious LV thrombus seen on this exam. Would recommend a limited contrast study to effectively rule this out  given the patient's history of stroke. 3. Basal and mid inferolateral wall and apical inferior segment are abnormal. 4. Mildly dilated left ventricular internal cavity size. 5. The left ventricle demonstrates global hypokinesis. 6. Global right ventricle has normal systolic function.The right ventricular size is normal. No increase in right ventricular wall thickness. 7. Left atrial size was normal. 8. Right atrial size was normal. 9. Presence of pericardial fat pad. 10. Mild mitral annular calcification. 11. The mitral valve is degenerative. Mild mitral valve regurgitation. 12. The tricuspid valve is grossly normal. Tricuspid valve regurgitation is trivial. 13. The aortic valve is tricuspid. Aortic valve regurgitation is not visualized. Mild to moderate aortic valve sclerosis/calcification without any evidence of aortic stenosis. 14. The pulmonic valve was grossly normal. Pulmonic valve regurgitation is not visualized. 15. TR signal is inadequate for assessing pulmonary artery systolic pressure. 16. A pacer wire is visualized in the RA and RV. 17. The inferior vena cava is normal in size with greater than 50% respiratory variability, suggesting right atrial pressure of 3 mmHg. 18. A prior study was performed on 08/12/2018. 19. No change from prior study.  Renal Ultrasound 05/31/2019  1. No acute sonographic abnormality detected. 2. Thickened urinary bladder wall which may be secondary to cystitis or chronic outlet obstruction. Correlation with urinalysis is recommended. 3. Prostatomegaly.  Microbiology: COVID-19 negative  Antimicrobials: Anti-infectives (From admission, onward)   None       Objective: Vitals:   05/31/19 1737 05/31/19 1938  05/31/19 2310 06/01/19 0306  BP: 129/73 113/69 131/71 131/64  Pulse: 67 70 64 64  Resp: (!) 21 19 19 15   Temp: 98.1 F (36.7 C) 98.5 F (36.9 C) 98.2 F (36.8 C) 98.6 F (37 C)  TempSrc: Oral Oral Oral   SpO2: 100% 98%       Intake/Output Summary (Last 24 hours) at 06/01/2019 [id number] Last data filed at 06/01/2019 0644 Gross per 24 hour  Intake 550 ml  Output 2400 ml  Net -1850 ml   There were no vitals filed for this visit. Weight change:   There is no height or weight on file to calculate BMI.  Intake/Output from previous day: 12/13 0701 - 12/14 0700 In: 550 [P.O.:250; IV Piggyback:300] Out: 2400 [Urine:2400] Intake/Output this shift: No intake/output data recorded.  Examination:  General exam: AA, dysarthric, follows commands,Weak appearing. HEENT:Oral mucosa moist, Ear/Nose WNL grossly, dentition normal. Respiratory system: b/l clear, no wheezing or crackles,no use of accessory muscle Cardiovascular system: S1 & S2 +, No JVD,. Gastrointestinal system: Abdomen soft, NT,ND, BS+ Nervous System:Alert, awake, moving extremities and grossly nonfocal Extremities: No edema, distal peripheral pulses palpable.  Skin: No rashes,no icterus. MSK: Normal muscle bulk,tone, power  Medications:  Scheduled Meds: . aspirin EC  81 mg Oral Daily  . Chlorhexidine Gluconate Cloth  6 each Topical Daily  . insulin aspart  0-15 Units Subcutaneous TID WC  . insulin glargine  20 Units Subcutaneous QHS  . rosuvastatin  10 mg Oral Q M,W,F  . sodium chloride flush  3 mL Intravenous Once  . sotalol  120 mg Oral BID  . tamsulosin  0.4 mg Oral Daily   Continuous Infusions: . sodium chloride Stopped (05/29/19 2210)    Data Reviewed: I have personally reviewed following labs and imaging studies  CBC: Recent Labs  Lab 05/26/19 0839 05/26/19 0856 05/28/19 0417 05/29/19 0220 05/30/19 0424 05/31/19 0222  WBC 11.3*  --  16.0* 13.5* 9.5 11.1*  NEUTROABS 8.4*  --   --   --   --   --   HGB 13.9 13.9 14.3 13.8 12.9* 13.6  HCT 41.3 41.0 42.3 40.1 39.0 40.7  MCV 96.0  --  95.5 94.6 96.3 95.5  PLT 168  --  178 170 156 168   Basic Metabolic Panel: Recent Labs  Lab 05/29/19 0220 05/30/19 0424 05/31/19 0222  05/31/19 1636 06/01/19 0259  NA 144 143 146* 144 144  K 3.6 3.0* 3.0* 3.3* 3.3*  CL 110 109 106 108 109  CO2 22 23 25 23 24   GLUCOSE 157* 148* 159* 179* 142*  BUN 35* 32* 28* 27* 25*  CREATININE 1.67* 1.24 1.31* 0.89 0.78  CALCIUM 9.1 8.6* 9.1 8.9 8.8*  MG  --   --  1.9  --  1.6*  PHOS  --   --   --  3.8 3.8   GFR: CrCl cannot be calculated (Unknown ideal weight.). Liver Function Tests: Recent Labs  Lab 05/26/19 0839 05/31/19 1636 06/01/19 0259  AST 24  --   --   ALT 31  --   --   ALKPHOS 58  --   --   BILITOT 1.4*  --   --   PROT 7.4  --   --   ALBUMIN 3.8 3.0* 2.9*   No results for input(s): LIPASE, AMYLASE in the last 168 hours. No results for input(s): AMMONIA in the last 168 hours. Coagulation Profile: Recent Labs  Lab 05/26/19 0839  INR 1.0  Cardiac Enzymes: No results for input(s): CKTOTAL, CKMB, CKMBINDEX, TROPONINI in the last 168 hours. BNP (last 3 results) No results for input(s): PROBNP in the last 8760 hours. HbA1C: No results for input(s): HGBA1C in the last 72 hours. CBG: Recent Labs  Lab 05/31/19 0606 05/31/19 1226 05/31/19 1732 05/31/19 2103 06/01/19 0613  GLUCAP 151* 217* 148* 154* 140*   Lipid Profile: No results for input(s): CHOL, HDL, LDLCALC, TRIG, CHOLHDL, LDLDIRECT in the last 72 hours. Thyroid Function Tests: No results for input(s): TSH, T4TOTAL, FREET4, T3FREE, THYROIDAB in the last 72 hours. Anemia Panel: No results for input(s): VITAMINB12, FOLATE, FERRITIN, TIBC, IRON, RETICCTPCT in the last 72 hours. Sepsis Labs: No results for input(s): PROCALCITON, LATICACIDVEN in the last 168 hours.  Recent Results (from the past 240 hour(s))  SARS CORONAVIRUS 2 (TAT 6-24 HRS) Nasopharyngeal Nasopharyngeal Swab     Status: None   Collection Time: 05/26/19  3:19 PM   Specimen: Nasopharyngeal Swab  Result Value Ref Range Status   SARS Coronavirus 2 NEGATIVE NEGATIVE Final    Comment: (NOTE) SARS-CoV-2 target nucleic acids are NOT  DETECTED. The SARS-CoV-2 RNA is generally detectable in upper and lower respiratory specimens during the acute phase of infection. Negative results do not preclude SARS-CoV-2 infection, do not rule out co-infections with other pathogens, and should not be used as the sole basis for treatment or other patient management decisions. Negative results must be combined with clinical observations, patient history, and epidemiological information. The expected result is Negative. Fact Sheet for Patients: SugarRoll.be Fact Sheet for Healthcare Providers: https://www.woods-mathews.com/ This test is not yet approved or cleared by the [place] FDA and  has been authorized for detection and/or diagnosis of SARS-CoV-2 by FDA under an Emergency Use Authorization (EUA). This EUA will remain  in effect (meaning this test can be used) for the duration of the COVID-19 declaration under Section 56 4(b)(1) of the Act, 21 U.S.C. section 360bbb-3(b)(1), unless the authorization is terminated or revoked sooner. Performed at [facility], [place] [address] [place], [place] [address]       Radiology Studies: US Renal  Result Date: 05/31/2019 CLINICAL DATA:  Acute kidney injury EXAM: RENAL / URINARY TRACT ULTRASOUND COMPLETE COMPARISON:  None. FINDINGS: Right Kidney: Renal measurements: 11.7 x 5.5 x 5.3 cm = volume: 178 mL . Echogenicity within normal limits. No mass or hydronephrosis visualized. Left Kidney: Renal measurements: 10 x 5.1 x 6.1 cm = volume: 162 mL. Echogenicity within normal limits. No mass or hydronephrosis visualized. Bladder: There is a Foley catheter in place.  The bladder wall is thickened. Other: The prostate gland is enlarged measuring 4.8 x 3.1 x 3.3 cm. IMPRESSION: 1. No acute sonographic abnormality detected. 2. Thickened urinary bladder wall which may be secondary to cystitis or chronic outlet obstruction. Correlation with urinalysis is  recommended. 3. Prostatomegaly. Electronically Signed   By: Constance Holster M.D.   On: 05/31/2019 18:48      LOS: 6 days   Time spent: More than 50% of that time was spent in counseling and/or coordination of care.  Antonieta Pert, MD [facility]  06/01/2019, 8:22 AM

## 2019-06-01 NOTE — Progress Notes (Signed)
Noted pt's urine output is less bloody this am mitten applied to prevent pt from pulling on foley.  RN witnessed pt pulled out  and disconnected tthe  drainage bag x 1 during the shift.

## 2019-06-01 NOTE — TOC Progression Note (Addendum)
Transition of Care [facility]) - Progression Note    Patient Details  Name: HALIL RENTZ MRN: [id number] Date of Birth: 1940/08/20  Transition of Care [facility]) CM/SW [place], [place] Phone Number: 06/01/2019, 10:28 AM  Clinical Narrative:     Patient's wife chose [facility]. CSW contact [place] to inform them of facility choice. Insurance authorization has been issued starting on 06/02/2019. Next review date will be 06/05/2019 and clinicals will need to be faxed to [phone] and case manager is Darnelle Bos.   MD is aware and will order updated COVID screen. CSW will continue to follow and assist with TOC needs.   Expected Discharge Plan: [place] Barriers to Discharge: Continued Medical Work up, Ship broker  Expected Discharge Plan and Services Expected Discharge Plan: [place] In-house Referral: Clinical Social Work Discharge Planning Services: NA Post Acute Care Choice: [place] Living arrangements for the past 2 months: Single Family Home                 DME Arranged: N/A DME Agency: NA       HH Arranged: NA HH Agency: NA         Social Determinants of Health (SDOH) Interventions    Readmission Risk Interventions No flowsheet data found.

## 2019-06-02 LAB — GLUCOSE, CAPILLARY
Glucose-Capillary: 193 mg/dL — ABNORMAL HIGH (ref 70–99)
Glucose-Capillary: 165 mg/dL — ABNORMAL HIGH (ref 70–99)
Glucose-Capillary: 138 mg/dL — ABNORMAL HIGH (ref 70–99)

## 2019-06-02 MED ORDER — FINASTERIDE 5 MG PO TABS: 5.0000 mg | ORAL_TABLET | Freq: Every day | ORAL | 0 refills | Status: AC

## 2019-06-02 MED ORDER — TAMSULOSIN HCL 0.4 MG PO CAPS: 0.4000 mg | ORAL_CAPSULE | Freq: Every day | ORAL | Status: AC

## 2019-06-02 MED ORDER — INSULIN GLARGINE 100 UNIT/ML ~~LOC~~ SOLN: 20.0000 [IU] | Freq: Every day | SUBCUTANEOUS | 11 refills | Status: DC

## 2019-06-02 NOTE — TOC Transition Note (Signed)
Transition of Care [facility]) - CM/SW Discharge Note   Patient Details  Name: Tristan Myers MRN: [id number] Date of Birth: 1940/08/03  Transition of Care [facility]) CM/SW Contact:  Kirstie Peri, [place] Work Phone Number: 06/02/2019, 11:39 AM   Clinical Narrative:    Number to call report [phone] Rm # [id number]   Final next level of care: Skilled Nursing Facility Barriers to Discharge: Barriers Resolved   Patient Goals and CMS Choice Patient states their goals for this hospitalization and ongoing recovery are:: Pt's wife wants him to get better CMS Medicare.gov Compare Post Acute Care list provided to:: Patient Represenative (must comment) Choice offered to / list presented to : Spouse  Discharge Placement              Patient chooses bed at: [facility] Patient to be transferred to facility by: [place] Name of family member notified: [name] Patient and family notified of of transfer: 06/02/19  Discharge Plan and Services In-house Referral: Clinical Social Work Discharge Planning Services: NA Post Acute Care Choice: [place]          DME Arranged: N/A DME Agency: NA       HH Arranged: NA HH Agency: NA        Social Determinants of Health (SDOH) Interventions     Readmission Risk Interventions No flowsheet data found.

## 2019-06-02 NOTE — Progress Notes (Deleted)
Physician Discharge Summary  Tristan Myers JYN:[id number] DOB: 02-Apr-1941 DOA: 05/26/2019  PCP: Kaleen Mask, MD  Admit date: 05/26/2019 Discharge date: 06/02/2019  Admitted From: home Disposition:  SNF  Recommendations for Outpatient Follow-up:  1. Follow up with PCP in 1-2 weeks 2. Please obtain BMP/CBC in one week 3. Please follow up on the following pending results:  Home Health:NO  Equipment/Devices: FOLEY  Discharge Condition: Stable CODE STATUS: FULL Diet recommendation: Heart Healthy  Brief/Interim Summary:   78 year old male with history of AAA fever on warfarin previously but not currently, history of previous strokes left MCA embolic stroke in 2011 was started on Coumadin but refused to follow-up and admitted a month later with hemorrhagic conversion-and, and was discontinued, was kept on aspirin, history of CAD status post CABG, DM, HLD, PVD, history of ICD placement presented from home with 4-day history of altered mental status, speech and gait abnormalities.CT Angio/Neck revealed "acute infarct right superior cerebellum with hemorrhagic transformation unchanged. 20% diameter stenosis proximal right internal carotid artery and 35% diameter stenosis proximal left internal carotid artery. Left vertebral artery dominant. Mild calcific stenosis at the origin of the left vertebral artery. No significant intracranial stenosis".Patient is currently on aspirin.  Patient overall overall stable.  Seen by neurology resume aspirin and signed off. 12/13 patient had urine retention Foley catheter placed also had hematuria placed on Flomax.  His renal ultrasound showed enlarged prostate taken bladder likely from chronic bladder outlet obstruction versus cystitis-but UA with WBC only 11-20, no other evidence of UTI.12/14 hematuria has resolved, Foley catheter in place. This time patient is medically stable.  Need further skilled nursing facility placement. Foley is being  maintained to provide adequate drainage given urine retention and possible chronic bladder outlet obstruction placed on Flomax also start Proscar.Discussed with Dr. Berneice Heinrich from urology who recommends to keep the Foley catheter in and the skilled nursing facility call his office to arrange for follow-up.  Will provide the number on discharge.   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 Completed stroke w/u with:CT head CT head and neck carotid Dopplers, 2D echo-EF 25 to 30% no definite clot.LDL 108 A1c 7.3.  Continue on aspirin as per neurology recommendation.  Unable to anticoagulate  Dementia with baseline speech issues/Dysphagia:secondary to stroke.seen by speech cleared for diet continue the same.  Continue aspiration precaution.wife mentioned that patient has had decline in past 3 months and they have taken over his finances and medication.   Acute metabolic encephalopathyin the setting of stroke with residual patient confusion: Mental status is stable at baseline dementia. he is somewhat slow to react likely from his dysphasia  AKI with creatinine 1.8,on admission 0.7.Suspect multifactorial with IV contrast use, decreased oral intake.improved.  Also suspected some degree of obstructive uropathy given need enlarged prostate now needing Foley catheter Last Labs          Recent Labs  Lab 05/29/19 0220 05/30/19 0424 05/31/19 0222 05/31/19 1636 06/01/19 0259  BUN 35* 32* 28* 27* 25*  CREATININE 1.67* 1.24 1.31* 0.89 0.78     HypoKalemia:replaced  Urine retention RRR clear structural enlarged prostate on the renal ultrasound: continue Flomax, keep Foley in place.  He will need follow-up with urology upon discharge.  This was discussed with Dr. Berneice Heinrich from urology and patient is to follow-up with himc  History of strokex2:01/2010- L MCA infarct w/ hemorrhagic transformation in setting  of INR 4.16.  refused to go to f/u MD or therapy appts but did take meds as prescribed after stroke in July 2011. 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  Continue Flomax mainly for bladder outlet obstruction and also to help with blood pressure control.  Home meds on hold for now-and has been discontinued.    Atrial fibrillation and flutter:Currently normal sinus rhythm.  Patient has been felt to be poor candidate- has appointment with Dr Graciela HusbandsKlein in Jan and will need to f/u with him and have Dr. Pearlean BrownieSethi to discuss if he qualifies for DOAC anticoagulation in light of another stroke.  He has been followed by cardiology.  I again discussed Dr. Roda ShuttersXu from neurology 12/14- at this time recommendation is to continue aspirin and follow-up with his cardiology and neurology to discuss more.   Diabetes mellitus type 2 uncontrolled A1c 7.3, goal less than 7:Blood sugar is controlled continue 20 U Lantus and  continue sliding scale insulin at the skilled nursing facility. At home uses 10 units if 200, And if>250 uses 20 units, uses twice a day.  HLD: On crestor 10 mg at home,cont, ldl goal <70, now at 108  WGN:FAOZHYQR:present on admission.  Incontinence of urine/bowel for 3 monthsAnd mostly bedbound barely able get to bathroom with walker.  Pressure Ulcer: Pressure Injury 05/27/19 Buttocks Right;Left Stage II -  Partial thickness loss of dermis presenting as a shallow open ulcer with a red, pink wound bed without slough. 2 dime size open areas with no drainage noted (Active)  05/27/19 1623  Location: Buttocks  Location Orientation: Right;Left  Staging: Stage II -   Partial thickness loss of dermis presenting as a shallow open ulcer with a red, pink wound bed without slough.  Wound Description (Comments): 2 dime size open areas with no drainage noted  Present on Admission: Yes    There is no height or weight on file to calculate BMI.    DVT prophylaxis:SCD Code Status:DNR. Family Communica:Pt's wife aware about plan for rehabilitation.  Disposition Plan: SNF  Today. covid 19 neg 12/14 Consultants:  Procedures: CT head CTA head and neck 1. Acute infarct right superior cerebellum with hemorrhagic transformation unchanged. 2. 20% diameter stenosis proximal right internal carotid artery and 35% diameter stenosis proximal left internal carotid artery. 3. Left vertebral artery dominant. Mild calcific stenosis at the origin of the left vertebral artery. 4. No significant intracranial stenosis.  TTE 05/27/19. 1. Left ventricular ejection fraction, by visual estimation, is 25 to 30%. The left ventricle has severely decreased function. There is no left ventricular hypertrophy. 2. No obvious LV thrombus seen on this exam. Would recommend a limited contrast study to effectively rule this out given the patient's history of stroke. 3. Basal and mid inferolateral wall and apical inferior segment are abnormal. 4. Mildly dilated left ventricular internal cavity size. 5. The left ventricle demonstrates global hypokinesis. 6. Global right ventricle has normal systolic function.The right ventricular size is normal. No increase in right ventricular wall thickness. 7. Left atrial size was normal. 8. Right atrial size was normal. 9. Presence of pericardial fat pad. 10. Mild mitral annular calcification. 11. The mitral valve is degenerative. Mild mitral valve regurgitation. 12. The tricuspid valve is grossly  normal. Tricuspid valve regurgitation is trivial. 13. The aortic valve is tricuspid. Aortic valve regurgitation is not visualized. Mild to  moderate aortic valve sclerosis/calcification without any evidence of aortic stenosis. 14. The pulmonic valve was grossly normal. Pulmonic valve regurgitation is not visualized. 15. TR signal is inadequate for assessing pulmonary artery systolic pressure. 16. A pacer wire is visualized in the RA and RV. 17. The inferior vena cava is normal in size with greater than 50% respiratory variability, suggesting right atrial pressure of 3 mmHg. 18. A prior study was performed on 08/12/2018. 19. No change from prior study.  Renal Ultrasound 05/31/2019  1. No acute sonographic abnormality detected. 2. Thickened urinary bladder wall which may be secondary to cystitis. or chronic outlet obstruction. 3. Prostatomegaly- The prostate gland is enlarged measuring 4.8 x 3.1 x 3.3 cm   Discharge Instructions  Discharge Instructions    Ambulatory referral to Neurology   Complete by: As directed    Follow up with Dr. Pearlean Brownie at [facility] in 4-6 weeks. Too complicated for NP to follow. Thanks.   Diet Carb Modified   Complete by: As directed    Discharge instructions   Complete by: As directed    Please call call MD or return to ER for similar or worsening recurring problem that brought you to hospital or if any fever,nausea/vomiting,abdominal pain, uncontrolled pain, chest pain,  shortness of breath or any other alarming symptoms.  Please follow-up your doctor as instructed in a week time and call the office for appointment.  Please avoid alcohol, smoking, or any other illicit substance and maintain healthy habits including taking your regular medications as prescribed.  You were cared for by a hospitalist during your hospital stay. If you have any questions about your discharge medications or the care you received while you were in the hospital after you are discharged, you can call the unit and ask to speak with the hospitalist on call if the hospitalist that took care of you is not available.  Once you are  discharged, your primary care physician will handle any further medical issues. Please note that NO REFILLS for any discharge medications will be authorized once you are discharged, as it is imperative that you return to your primary care physician (or establish a relationship with a primary care physician if you do not have one) for your aftercare needs so that they can reassess your need for medications and monitor your lab values Please Follow-up Dr. Emmaline Life office for Foley catheter and voiding trial management in 3 to 4 days. Follow-up Dr. Graciela Husbands from cardiology in January as scheduled and with neurology post discharge follow-up   Increase activity slowly   Complete by: As directed      Allergies as of 06/02/2019      Reactions   Ace Inhibitors Cough   Betadine [povidone Iodine]    Warfarin And Related Other (See Comments)   BLEEDING (NON-SPECIFIC)      Medication List    STOP taking these medications   insulin NPH-regular Human (70-30) 100 UNIT/ML injection   losartan 25 MG tablet Commonly known as: COZAAR   metoprolol succinate 25 MG 24 hr tablet Commonly known as: TOPROL-XL     TAKE these medications   aspirin EC 81 MG tablet Take 81 mg by mouth daily.   finasteride 5 MG tablet Commonly known as: Proscar Take 1 tablet (5 mg total) by mouth daily.   insulin glargine 100 UNIT/ML injection Commonly known as: Lantus Inject 0.2 mLs (  20 Units total) into the skin at bedtime. Sliding Scale What changed: how much to take   PreserVision AREDS 2 Caps Take 1 capsule by mouth daily.   rosuvastatin 10 MG tablet Commonly known as: CRESTOR Take 1 tablet (10 mg total) by mouth 3 (three) times a week. What changed: additional instructions   sotalol 80 MG tablet Commonly known as: BETAPACE Take 1  1/2 tab 2 times daily NEED OFFICE VISIT FOR MORE REFILLS What changed:   how much to take  how to take this  when to take this   tamsulosin 0.4 MG Caps capsule Commonly  known as: FLOMAX Take 1 capsule (0.4 mg total) by mouth daily. Start taking on: June 03, 2019   triamcinolone cream 0.1 % Commonly known as: KENALOG Apply 1 application topically 4 (four) times daily as needed for rash.       Contact information for follow-up providers    Micki Riley, MD. Schedule an appointment as soon as possible for a visit in 4 week(s).   Specialties: Neurology, Radiology Contact information: [address] [place] [address] [phone]        Sebastian Ache, MD. Call in 4 day(s).   Specialty: Urology Why: Regarding management of Foley and voiding trial Contact information: [address] [place] [address] [phone]            Contact information for after-discharge care    Destination    [facility] Preferred SNF .   Service: Skilled Nursing Contact information: [address] [place] [address] [phone]                 Allergies  Allergen Reactions  . Ace Inhibitors Cough  . Betadine [Povidone Iodine]   . Warfarin And Related Other (See Comments)    BLEEDING (NON-SPECIFIC)    Procedures/Studies: CT ANGIO HEAD W OR WO CONTRAST  Result Date: 05/28/2019 CLINICAL DATA:  Follow-up stroke. EXAM: CT ANGIOGRAPHY HEAD AND NECK TECHNIQUE: Multidetector CT imaging of the head and neck was performed using the standard protocol during bolus administration of intravenous contrast. Multiplanar CT image reconstructions and MIPs were obtained to evaluate the vascular anatomy. Carotid stenosis measurements (when applicable) are obtained utilizing NASCET criteria, using the distal internal carotid diameter as the denominator. CONTRAST:  18mL OMNIPAQUE IOHEXOL 350 MG/ML SOLN COMPARISON:  CT head 05/26/2019 FINDINGS: CT HEAD FINDINGS Brain: Hypodensity in the right superior cerebellum similar in size. Associated high-density hemorrhage within the hypodensity, also unchanged. Findings most  compatible with acute infarct with hemorrhage. Chronic infarcts in the right frontal parietal lobe. Larger area of chronic infarct in the left temporoparietal lobe. Generalized atrophy without hydrocephalus. Vascular: Negative for hyperdense vessel Skull: Negative Sinuses: Negative Orbits: Negative Review of the MIP images confirms the above findings CTA NECK FINDINGS Aortic arch: Standard branching. Imaged portion shows no evidence of aneurysm or dissection. No significant stenosis of the major arch vessel origins. Atherosclerotic calcification in the aortic arch and proximal great vessels. Right carotid system: Atherosclerotic calcification right carotid bifurcation. Estimated 20% diameter stenosis right internal carotid artery. Calcified plaque right common carotid artery without significant stenosis. Left carotid system: Atherosclerotic calcification left carotid bifurcation. 35% diameter stenosis proximal left internal carotid artery. Vertebral arteries: Left vertebral artery dominant. Mild calcific stenosis at the origin. Right vertebral artery is patent without significant stenosis. Skeleton: Cervical spondylosis. No acute skeletal abnormality Other neck: Negative for mass or soft tissue swelling in the neck. Upper chest: Lung  apices clear bilaterally. Pacemaker pack in the left chest. Median sternotomy. Review of the MIP images confirms the above findings CTA HEAD FINDINGS Anterior circulation: Cavernous carotid widely patent bilaterally. Anterior and middle cerebral arteries patent bilaterally without stenosis. Posterior circulation: Both vertebral arteries patent to the basilar. PICA patent bilaterally. Basilar widely patent. Superior cerebellar and posterior cerebral arteries patent bilaterally. Fetal origin of the posterior cerebral artery bilaterally. Venous sinuses: Normal venous enhancement. Anatomic variants: None Review of the MIP images confirms the above findings IMPRESSION: 1. Acute infarct right  superior cerebellum with hemorrhagic transformation unchanged. 2. 20% diameter stenosis proximal right internal carotid artery and 35% diameter stenosis proximal left internal carotid artery. 3. Left vertebral artery dominant. Mild calcific stenosis at the origin of the left vertebral artery. 4. No significant intracranial stenosis. Electronically Signed   By: Marlan Palau M.D.   On: 05/28/2019 12:40   CT HEAD WO CONTRAST  Result Date: 05/26/2019 CLINICAL DATA:  Multiple falls, weakness EXAM: CT HEAD WITHOUT CONTRAST TECHNIQUE: Contiguous axial images were obtained from the base of the skull through the vertex without intravenous contrast. COMPARISON:  2011 FINDINGS: Brain: There is an area of low attenuation with superimposed increased density within the right cerebellar hemisphere extending into the vermis. No significant mass effect. There are chronic infarctions of the right frontoparietal lobes and left temporoparietal lobes with associated volume loss. Prominence of the ventricles and sulci reflects superimposed generalized parenchymal volume loss. Additional patchy hypoattenuation in the supratentorial white matter is nonspecific but probably reflects mild chronic microvascular ischemic changes. Vascular: No hyperdense vessel or unexpected calcification. Skull: Calvarium is unremarkable. Sinuses/Orbits: Mild paranasal sinus mucosal thickening. No acute intraorbital abnormality. Other: Mastoid air cells are clear. IMPRESSION: Likely subacute right cerebellar infarction with mild reperfusion hemorrhage. No significant mass effect. Follow-up CT or MR imaging is recommended to exclude a mass. Chronic/nonemergent findings detailed above. Electronically Signed   By: Guadlupe Spanish M.D.   On: 05/26/2019 10:30   CT ANGIO NECK W OR WO CONTRAST  Result Date: 05/28/2019 CLINICAL DATA:  Follow-up stroke. EXAM: CT ANGIOGRAPHY HEAD AND NECK TECHNIQUE: Multidetector CT imaging of the head and neck was performed  using the standard protocol during bolus administration of intravenous contrast. Multiplanar CT image reconstructions and MIPs were obtained to evaluate the vascular anatomy. Carotid stenosis measurements (when applicable) are obtained utilizing NASCET criteria, using the distal internal carotid diameter as the denominator. CONTRAST:  50mL OMNIPAQUE IOHEXOL 350 MG/ML SOLN COMPARISON:  CT head 05/26/2019 FINDINGS: CT HEAD FINDINGS Brain: Hypodensity in the right superior cerebellum similar in size. Associated high-density hemorrhage within the hypodensity, also unchanged. Findings most compatible with acute infarct with hemorrhage. Chronic infarcts in the right frontal parietal lobe. Larger area of chronic infarct in the left temporoparietal lobe. Generalized atrophy without hydrocephalus. Vascular: Negative for hyperdense vessel Skull: Negative Sinuses: Negative Orbits: Negative Review of the MIP images confirms the above findings CTA NECK FINDINGS Aortic arch: Standard branching. Imaged portion shows no evidence of aneurysm or dissection. No significant stenosis of the major arch vessel origins. Atherosclerotic calcification in the aortic arch and proximal great vessels. Right carotid system: Atherosclerotic calcification right carotid bifurcation. Estimated 20% diameter stenosis right internal carotid artery. Calcified plaque right common carotid artery without significant stenosis. Left carotid system: Atherosclerotic calcification left carotid bifurcation. 35% diameter stenosis proximal left internal carotid artery. Vertebral arteries: Left vertebral artery dominant. Mild calcific stenosis at the origin. Right vertebral artery is patent without significant stenosis. Skeleton: Cervical spondylosis. No  acute skeletal abnormality Other neck: Negative for mass or soft tissue swelling in the neck. Upper chest: Lung apices clear bilaterally. Pacemaker pack in the left chest. Median sternotomy. Review of the MIP images  confirms the above findings CTA HEAD FINDINGS Anterior circulation: Cavernous carotid widely patent bilaterally. Anterior and middle cerebral arteries patent bilaterally without stenosis. Posterior circulation: Both vertebral arteries patent to the basilar. PICA patent bilaterally. Basilar widely patent. Superior cerebellar and posterior cerebral arteries patent bilaterally. Fetal origin of the posterior cerebral artery bilaterally. Venous sinuses: Normal venous enhancement. Anatomic variants: None Review of the MIP images confirms the above findings IMPRESSION: 1. Acute infarct right superior cerebellum with hemorrhagic transformation unchanged. 2. 20% diameter stenosis proximal right internal carotid artery and 35% diameter stenosis proximal left internal carotid artery. 3. Left vertebral artery dominant. Mild calcific stenosis at the origin of the left vertebral artery. 4. No significant intracranial stenosis. Electronically Signed   By: Marlan Palau M.D.   On: 05/28/2019 12:40   US Renal  Result Date: 05/31/2019 CLINICAL DATA:  Acute kidney injury EXAM: RENAL / URINARY TRACT ULTRASOUND COMPLETE COMPARISON:  None. FINDINGS: Right Kidney: Renal measurements: 11.7 x 5.5 x 5.3 cm = volume: 178 mL . Echogenicity within normal limits. No mass or hydronephrosis visualized. Left Kidney: Renal measurements: 10 x 5.1 x 6.1 cm = volume: 162 mL. Echogenicity within normal limits. No mass or hydronephrosis visualized. Bladder: There is a Foley catheter in place.  The bladder wall is thickened. Other: The prostate gland is enlarged measuring 4.8 x 3.1 x 3.3 cm. IMPRESSION: 1. No acute sonographic abnormality detected. 2. Thickened urinary bladder wall which may be secondary to cystitis or chronic outlet obstruction. Correlation with urinalysis is recommended. 3. Prostatomegaly. Electronically Signed   By: Katherine Mantle M.D.   On: 05/31/2019 18:48   DG CHEST PORT 1 VIEW  Result Date: 05/27/2019 CLINICAL DATA:   Altered mental status. EXAM: PORTABLE CHEST 1 VIEW COMPARISON:  Radiographs 03/20/2011 and 01/23/2010. FINDINGS: 1632 hours. The heart size and mediastinal contours are stable status post median sternotomy and CABG. Left subclavian AICD lead projects to the right ventricular apex. The lungs are clear. There is no pleural effusion or pneumothorax. No acute osseous findings. IMPRESSION: Stable postoperative chest. No active cardiopulmonary process. Electronically Signed   By: Carey Bullocks M.D.   On: 05/27/2019 16:42   ECHOCARDIOGRAM COMPLETE  Result Date: 05/27/2019   ECHOCARDIOGRAM REPORT   Patient Name:   LAMICHAEL YOUKHANA Date of Exam: 05/27/2019 Medical Rec #:  [id number]     Height:       67.0 in Accession #:    [id number]    Weight:       162.6 lb Date of Birth:  1940/09/27      BSA:          1.85 m Patient Age:    78 years      BP:           99/55 mmHg Patient Gender: M             HR:           68 bpm. Exam Location:  Inpatient Procedure: 2D Echo Indications:    Stroke 434.91 / I163.9  History:        Patient has prior history of Echocardiogram examinations, most                 recent 08/12/2018. CAD, Stroke, Arrythmias:Atrial Fibrillation  and Atrial Flutter; Risk Factors:Former Smoker. Ventricular                 Tachycardia, ICD, DNR, Ischemic Cardiomyopathy.  Sonographer:    Jeryl Columbia Referring Phys: Armin.Lipps SHARON L BIBY IMPRESSIONS  1. Left ventricular ejection fraction, by visual estimation, is 25 to 30%. The left ventricle has severely decreased function. There is no left ventricular hypertrophy.  2. No obvious LV thrombus seen on this exam. Would recommend a limited contrast study to effectively rule this out given the patient's history of stroke.  3. Basal and mid inferolateral wall and apical inferior segment are abnormal.  4. Mildly dilated left ventricular internal cavity size.  5. The left ventricle demonstrates global hypokinesis.  6. Global right ventricle has normal systolic  function.The right ventricular size is normal. No increase in right ventricular wall thickness.  7. Left atrial size was normal.  8. Right atrial size was normal.  9. Presence of pericardial fat pad. 10. Mild mitral annular calcification. 11. The mitral valve is degenerative. Mild mitral valve regurgitation. 12. The tricuspid valve is grossly normal. Tricuspid valve regurgitation is trivial. 13. The aortic valve is tricuspid. Aortic valve regurgitation is not visualized. Mild to moderate aortic valve sclerosis/calcification without any evidence of aortic stenosis. 14. The pulmonic valve was grossly normal. Pulmonic valve regurgitation is not visualized. 15. TR signal is inadequate for assessing pulmonary artery systolic pressure. 16. A pacer wire is visualized in the RA and RV. 17. The inferior vena cava is normal in size with greater than 50% respiratory variability, suggesting right atrial pressure of 3 mmHg. 18. A prior study was performed on 08/12/2018. 19. No change from prior study. FINDINGS  Left Ventricle: Left ventricular ejection fraction, by visual estimation, is 25 to 30%. The left ventricle has severely decreased function. The left ventricle demonstrates global hypokinesis. The left ventricular internal cavity size was mildly dilated left ventricle. There is no left ventricular hypertrophy. No obvious LV thrombus seen on this exam. Would recommend a limited contrast study to effectively rule this out given the patient's history of stroke.  LV Wall Scoring: The basal and mid inferolateral wall and apical inferior segment are akinetic. Right Ventricle: The right ventricular size is normal. No increase in right ventricular wall thickness. Global RV systolic function is has normal systolic function. Left Atrium: Left atrial size was normal in size. Right Atrium: Right atrial size was normal in size Pericardium: There is no evidence of pericardial effusion. Presence of pericardial fat pad. Mitral Valve: The  mitral valve is degenerative in appearance. Mild mitral annular calcification. Mild mitral valve regurgitation. Tricuspid Valve: The tricuspid valve is grossly normal. Tricuspid valve regurgitation is trivial. Aortic Valve: The aortic valve is tricuspid. Aortic valve regurgitation is not visualized. Mild to moderate aortic valve sclerosis/calcification is present, without any evidence of aortic stenosis. Pulmonic Valve: The pulmonic valve was grossly normal. Pulmonic valve regurgitation is not visualized. Pulmonic regurgitation is not visualized. Aorta: The aortic root is normal in size and structure. Venous: The inferior vena cava is normal in size with greater than 50% respiratory variability, suggesting right atrial pressure of 3 mmHg. IAS/Shunts: No atrial level shunt detected by color flow Doppler. Additional Comments: A prior study was performed on 08/12/2018. A pacer wire is visualized in the right atrium and right ventricle.  LEFT VENTRICLE PLAX 2D LVIDd:         5.90 cm  Diastology LVIDs:         5.20 cm  LV e' lateral:   6.50 cm/s LV PW:         0.90 cm  LV E/e' lateral: 7.4 LV IVS:        1.00 cm  LV e' medial:    4.75 cm/s LVOT diam:     2.10 cm  LV E/e' medial:  10.1 LV SV:         44 ml LV SV Index:   23.26 LVOT Area:     3.46 cm  RIGHT VENTRICLE TAPSE (M-mode): 1.2 cm LEFT ATRIUM             Index       RIGHT ATRIUM           Index LA diam:        4.20 cm 2.27 cm/m  RA Area:     12.70 cm LA Vol (A2C):   67.1 ml 36.23 ml/m RA Volume:   29.30 ml  15.82 ml/m LA Vol (A4C):   52.0 ml 28.08 ml/m LA Biplane Vol: 61.6 ml 33.26 ml/m   AORTA Ao Root diam: 3.70 cm MITRAL VALVE MV Area (PHT): 3.72 cm             SHUNTS MV PHT:        59.16 msec           Systemic Diam: 2.10 cm MV Decel Time: 204 msec MV E velocity: 48.00 cm/s 103 cm/s MV A velocity: 36.00 cm/s 70.3 cm/s MV E/A ratio:  1.33       1.5  Lennie Odor MD Electronically signed by Lennie Odor MD Signature Date/Time: 05/27/2019/6:07:40 PM     Final    VAS US CAROTID (at [facility] and [facility] only)  Result Date: 05/26/2019 Carotid Arterial Duplex Study Indications:       CVA. Risk Factors:      Hypertension, hyperlipidemia, Diabetes, coronary artery                    disease, prior CVA. Comparison Study:  no prior Performing Technologist: Blanch Media RVS  Examination Guidelines: A complete evaluation includes B-mode imaging, spectral Doppler, color Doppler, and power Doppler as needed of all accessible portions of each vessel. Bilateral testing is considered an integral part of a complete examination. Limited examinations for reoccurring indications may be performed as noted.  Right Carotid Findings: +----------+--------+--------+--------+------------------+--------+           PSV cm/sEDV cm/sStenosisPlaque DescriptionComments +----------+--------+--------+--------+------------------+--------+ CCA Prox  50      10              heterogenous               +----------+--------+--------+--------+------------------+--------+ CCA Distal58      11              heterogenous               +----------+--------+--------+--------+------------------+--------+ ICA Prox  120     19      1-39%   heterogenous               +----------+--------+--------+--------+------------------+--------+ ICA Distal80      21                                         +----------+--------+--------+--------+------------------+--------+ ECA       75                                                 +----------+--------+--------+--------+------------------+--------+ +----------+--------+-------+--------+-------------------+  PSV cm/sEDV cmsDescribeArm Pressure (mmHG) +----------+--------+-------+--------+-------------------+ [id number]                                        +----------+--------+-------+--------+-------------------+ +---------+--------+--+--------+-+---------+ VertebralPSV cm/s40EDV cm/s8Antegrade  +---------+--------+--+--------+-+---------+  Left Carotid Findings: +----------+--------+--------+--------+------------------+---------+           PSV cm/sEDV cm/sStenosisPlaque DescriptionComments  +----------+--------+--------+--------+------------------+---------+ CCA Prox  69      12              heterogenous                +----------+--------+--------+--------+------------------+---------+ CCA Distal75      17              heterogenous                +----------+--------+--------+--------+------------------+---------+ ICA Prox  97      24      1-39%   heterogenous      Shadowing +----------+--------+--------+--------+------------------+---------+ ICA Distal71      17                                          +----------+--------+--------+--------+------------------+---------+ ECA       83                                                  +----------+--------+--------+--------+------------------+---------+ +----------+--------+--------+--------+-------------------+           PSV cm/sEDV cm/sDescribeArm Pressure (mmHG) +----------+--------+--------+--------+-------------------+ Subclavian109                                         +----------+--------+--------+--------+-------------------+ +---------+--------+--+--------+-+---------+ VertebralPSV cm/s29EDV cm/s5Antegrade +---------+--------+--+--------+-+---------+  Summary:   *See table(s) above for measurements and observations.  Electronically signed by Harold Barban MD on 05/26/2019 at 5:21:33 PM.    Final     Subjective: Resting well.  No complaints.  Foley in place and has been manipulated to try to remove it.  Urine is clear.  Discharge Exam: Vitals:   06/02/19 0917 06/02/19 0928  BP: (!) 117/59 (!) 117/59  Pulse: 78 78  Resp:  18  Temp:  98.2 F (36.8 C)  SpO2:  100%   Vitals:   06/02/19 0038 06/02/19 0347 06/02/19 0917 06/02/19 0928  BP: 101/83 (!) 105/50 (!) 117/59 (!) 117/59   Pulse: 85 82 78 78  Resp: 18 16  18   Temp: 98.4 F (36.9 C) 97.9 F (36.6 C)  98.2 F (36.8 C)  TempSrc: Axillary Oral  Oral  SpO2: 95% 97%  100%    General: Pt is alert, awake, not in acute distress Cardiovascular: RRR, S1/S2 +, no rubs, no gallops Respiratory: CTA bilaterally, no wheezing, no rhonchi Abdominal: Soft, NT, ND, bowel sounds + Extremities: no edema, no cyanosis   The results of significant diagnostics from this hospitalization (including imaging, microbiology, ancillary and laboratory) are listed below for reference.     Microbiology: Recent Results (from the past 240 hour(s))  SARS CORONAVIRUS 2 (TAT 6-24 HRS) Nasopharyngeal Nasopharyngeal Swab     Status: None   Collection Time: 05/26/19  3:19 PM   Specimen:  Nasopharyngeal Swab  Result Value Ref Range Status   SARS Coronavirus 2 NEGATIVE NEGATIVE Final    Comment: (NOTE) SARS-CoV-2 target nucleic acids are NOT DETECTED. The SARS-CoV-2 RNA is generally detectable in upper and lower respiratory specimens during the acute phase of infection. Negative results do not preclude SARS-CoV-2 infection, do not rule out co-infections with other pathogens, and should not be used as the sole basis for treatment or other patient management decisions. Negative results must be combined with clinical observations, patient history, and epidemiological information. The expected result is Negative. Fact Sheet for Patients: HairSlick.no Fact Sheet for Healthcare Providers: quierodirigir.com This test is not yet approved or cleared by the [place] FDA and  has been authorized for detection and/or diagnosis of SARS-CoV-2 by FDA under an Emergency Use Authorization (EUA). This EUA will remain  in effect (meaning this test can be used) for the duration of the COVID-19 declaration under Section 56 4(b)(1) of the Act, 21 U.S.C. section 360bbb-3(b)(1), unless the  authorization is terminated or revoked sooner. Performed at [facility], [address] [place], [place] [address]   SARS CORONAVIRUS 2 (TAT 6-24 HRS) Nasopharyngeal Nasopharyngeal Swab     Status: None   Collection Time: 06/01/19 10:50 AM   Specimen: Nasopharyngeal Swab  Result Value Ref Range Status   SARS Coronavirus 2 NEGATIVE NEGATIVE Final    Comment: (NOTE) SARS-CoV-2 target nucleic acids are NOT DETECTED. The SARS-CoV-2 RNA is generally detectable in upper and lower respiratory specimens during the acute phase of infection. Negative results do not preclude SARS-CoV-2 infection, do not rule out co-infections with other pathogens, and should not be used as the sole basis for treatment or other patient management decisions. Negative results must be combined with clinical observations, patient history, and epidemiological information. The expected result is Negative. Fact Sheet for Patients: HairSlick.no Fact Sheet for Healthcare Providers: quierodirigir.com This test is not yet approved or cleared by the [place] FDA and  has been authorized for detection and/or diagnosis of SARS-CoV-2 by FDA under an Emergency Use Authorization (EUA). This EUA will remain  in effect (meaning this test can be used) for the duration of the COVID-19 declaration under Section 56 4(b)(1) of the Act, 21 U.S.C. section 360bbb-3(b)(1), unless the authorization is terminated or revoked sooner. Performed at [facility], [address] [place], [place] [address]      Labs: BNP (last 3 results) No results for input(s): BNP in the last 8760 hours. Basic Metabolic Panel: Recent Labs  Lab 05/29/19 0220 05/30/19 0424 05/31/19 0222 05/31/19 1636 06/01/19 0259  NA 144 143 146* 144 144  K 3.6 3.0* 3.0* 3.3* 3.3*  CL 110 109 106 108 109  CO2 22 23 25 23 24   GLUCOSE 157* 148* 159* 179* 142*  BUN 35* 32* 28* 27* 25*   CREATININE 1.67* 1.24 1.31* 0.89 0.78  CALCIUM 9.1 8.6* 9.1 8.9 8.8*  MG  --   --  1.9  --  1.6*  PHOS  --   --   --  3.8 3.8   Liver Function Tests: Recent Labs  Lab 05/31/19 1636 06/01/19 0259  ALBUMIN 3.0* 2.9*   No results for input(s): LIPASE, AMYLASE in the last 168 hours. No results for input(s): AMMONIA in the last 168 hours. CBC: Recent Labs  Lab 05/28/19 0417 05/29/19 0220 05/30/19 0424 05/31/19 0222  WBC 16.0* 13.5* 9.5 11.1*  HGB 14.3 13.8 12.9* 13.6  HCT 42.3 40.1 39.0 40.7  MCV 95.5 94.6 96.3 95.5  PLT 178 170 156 168   Cardiac Enzymes: No results for input(s): CKTOTAL, CKMB, CKMBINDEX, TROPONINI in the last 168 hours. BNP: Invalid input(s): POCBNP CBG: Recent Labs  Lab 06/01/19 0613 06/01/19 1242 06/01/19 1642 06/01/19 2109 06/02/19 0556  GLUCAP 140* 129* 186* 174* 193*   D-Dimer No results for input(s): DDIMER in the last 72 hours. Hgb A1c No results for input(s): HGBA1C in the last 72 hours. Lipid Profile No results for input(s): CHOL, HDL, LDLCALC, TRIG, CHOLHDL, LDLDIRECT in the last 72 hours. Thyroid function studies No results for input(s): TSH, T4TOTAL, T3FREE, THYROIDAB in the last 72 hours.  Invalid input(s): FREET3 Anemia work up No results for input(s): VITAMINB12, FOLATE, FERRITIN, TIBC, IRON, RETICCTPCT in the last 72 hours. Urinalysis    Component Value Date/Time   COLORURINE RED (A) 05/31/2019 1853   APPEARANCEUR TURBID (A) 05/31/2019 1853   LABSPEC  05/31/2019 1853    TEST NOT REPORTED DUE TO COLOR INTERFERENCE OF URINE PIGMENT   PHURINE  05/31/2019 1853    TEST NOT REPORTED DUE TO COLOR INTERFERENCE OF URINE PIGMENT   GLUCOSEU (A) 05/31/2019 1853    TEST NOT REPORTED DUE TO COLOR INTERFERENCE OF URINE PIGMENT   HGBUR (A) 05/31/2019 1853    TEST NOT REPORTED DUE TO COLOR INTERFERENCE OF URINE PIGMENT   BILIRUBINUR (A) 05/31/2019 1853    TEST NOT REPORTED DUE TO COLOR INTERFERENCE OF URINE PIGMENT   KETONESUR (A)  05/31/2019 1853    TEST NOT REPORTED DUE TO COLOR INTERFERENCE OF URINE PIGMENT   PROTEINUR (A) 05/31/2019 1853    TEST NOT REPORTED DUE TO COLOR INTERFERENCE OF URINE PIGMENT   UROBILINOGEN 0.2 01/23/2010 1231   NITRITE (A) 05/31/2019 1853    TEST NOT REPORTED DUE TO COLOR INTERFERENCE OF URINE PIGMENT   LEUKOCYTESUR (A) 05/31/2019 1853    TEST NOT REPORTED DUE TO COLOR INTERFERENCE OF URINE PIGMENT   Sepsis Labs Invalid input(s): PROCALCITONIN,  WBC,  LACTICIDVEN Microbiology Recent Results (from the past 240 hour(s))  SARS CORONAVIRUS 2 (TAT 6-24 HRS) Nasopharyngeal Nasopharyngeal Swab     Status: None   Collection Time: 05/26/19  3:19 PM   Specimen: Nasopharyngeal Swab  Result Value Ref Range Status   SARS Coronavirus 2 NEGATIVE NEGATIVE Final    Comment: (NOTE) SARS-CoV-2 target nucleic acids are NOT DETECTED. The SARS-CoV-2 RNA is generally detectable in upper and lower respiratory specimens during the acute phase of infection. Negative results do not preclude SARS-CoV-2 infection, do not rule out co-infections with other pathogens, and should not be used as the sole basis for treatment or other patient management decisions. Negative results must be combined with clinical observations, patient history, and epidemiological information. The expected result is Negative. Fact Sheet for Patients: HairSlick.no Fact Sheet for Healthcare Providers: quierodirigir.com This test is not yet approved or cleared by the [place] FDA and  has been authorized for detection and/or diagnosis of SARS-CoV-2 by FDA under an Emergency Use Authorization (EUA). This EUA will remain  in effect (meaning this test can be used) for the duration of the COVID-19 declaration under Section 56 4(b)(1) of the Act, 21 U.S.C. section 360bbb-3(b)(1), unless the authorization is terminated or revoked sooner. Performed at [facility], [address] [place], [place] [address]   SARS CORONAVIRUS 2 (TAT 6-24 HRS) Nasopharyngeal Nasopharyngeal Swab     Status: None   Collection Time: 06/01/19 10:50 AM   Specimen: Nasopharyngeal Swab  Result Value Ref Range Status   SARS Coronavirus 2 NEGATIVE NEGATIVE Final    Comment: (NOTE) SARS-CoV-2 target nucleic acids are NOT DETECTED. The SARS-CoV-2 RNA is generally detectable in upper and lower respiratory specimens during the acute phase of infection. Negative results do not preclude SARS-CoV-2 infection, do not rule out co-infections with other pathogens, and should not be used as the sole basis for treatment or other patient management decisions. Negative results must be combined with clinical observations, patient history, and epidemiological information. The expected result is Negative. Fact Sheet for Patients: HairSlick.no Fact Sheet for Healthcare Providers: quierodirigir.com This test is not yet approved or cleared by the [place] FDA and  has been authorized for detection and/or diagnosis of SARS-CoV-2 by FDA under an Emergency Use Authorization (EUA). This EUA will remain  in effect (meaning this test can be used) for the duration of the COVID-19 declaration under Section 56 4(b)(1) of the Act, 21 U.S.C. section 360bbb-3(b)(1), unless the authorization is terminated or revoked sooner. Performed at [facility], [address], [place], [place] [address]      Time coordinating discharge: 35 minutes  SIGNED:   Lanae Boast, MD  [facility] 06/02/2019, 11:21 AM  If 7PM-7AM, please contact night-coverage [email or web address]

## 2019-06-02 NOTE — Progress Notes (Signed)
The patient left the floor accompanied by 2 [employer] employees. No c/o pain or any acute distress noted.

## 2019-06-02 NOTE — Progress Notes (Signed)
Report given to [facility] at [facility].

## 2019-06-03 NOTE — Discharge Summary (Signed)
Physician Discharge Summary  Tristan Myers ZOX:[id number] DOB: [birth year] DOA: 05/26/2019  PCP: Kaleen Mask, MD  Admit date: 05/26/2019  Discharge date: 06/02/2019  Admitted From: home  Disposition: SNF  Recommendations for Outpatient Follow-up:  1. Follow up with PCP in 1-2 weeks 2. Please obtain BMP/CBC in one week 3. Please follow up on the following pending results:  Home Health:NO  Equipment/Devices: FOLEY  Discharge Condition: Stable  CODE STATUS: FULL  Diet recommendation: Heart Healthy   Brief/Interim Summary:  78 year old male with history of AAA fever on warfarin previously but not currently, history of previous strokes left MCA embolic stroke in 2011 was started on Coumadin but refused to follow-up and admitted a month later with hemorrhagic conversion-and, and was discontinued, was kept on aspirin, history of CAD status post CABG, DM, HLD, PVD, history of ICD placement presented from home with 4-day history of altered mental status, speech and gait abnormalities. CT Angio/Neck revealed "acute infarct right superior cerebellum with hemorrhagic transformation unchanged. 20% diameter stenosis proximal right internal carotid artery and 35% diameter stenosis proximal left internal carotid artery. Left vertebral artery dominant. Mild calcific stenosis at the origin of the left vertebral artery. No significant intracranial stenosis". Patient is currently on aspirin. Patient overall overall stable. Seen by neurology resume aspirin and signed off.  12/13 patient had urine retention Foley catheter placed also had hematuria placed on Flomax. His renal ultrasound showed enlarged prostate taken bladder likely from chronic bladder outlet obstruction versus cystitis-but UA with WBC only 11-20, no other evidence of UTI.12/14 hematuria has resolved, Foley catheter in place.  This time patient is medically stable. Need further skilled nursing facility placement.  Foley is being maintained to  provide adequate drainage given urine retention and possible chronic bladder outlet obstruction placed on Flomax also start Proscar.Discussed with Dr. Berneice Heinrich from urology who recommends to keep the Foley catheter in and the skilled nursing facility call his office to arrange for follow-up. Will provide the number on discharge.  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 Completed stroke w/u with:CT head CT head and neck carotid Dopplers, 2D echo-EF 25 to 30% no definite clot.LDL 108 A1c 7.3. Continue on aspirin as per neurology recommendation. Unable to anticoagulate  Dementia with baseline speech issues/Dysphagia: secondary to stroke.seen by speech cleared for diet continue the same. Continue aspiration precaution.wife mentioned that patient has had decline in past 3 months and they have taken over his finances and medication.  Acute metabolic encephalopathy in the setting of stroke with residual patient confusion: Mental status is stable at baseline dementia. he is somewhat slow to react likely from his dysphasia  AKI with creatinine 1.8, on admission 0.7.Suspect multifactorial with IV contrast use, decreased oral intake.improved. Also suspected some degree of obstructive uropathy given need enlarged prostate now needing Foley catheter  Last Labs          Recent Labs  Lab 05/29/19  0220 05/30/19  0424 05/31/19  0222 05/31/19  1636 06/01/19  0259  BUN 35* 32* 28* 27* 25*  CREATININE 1.67* 1.24 1.31* 0.89 0.78  HypoKalemia:replaced  Urine retention RRR clear structural enlarged prostate on the renal ultrasound: continue Flomax, keep Foley in place. He will need follow-up with urology upon discharge. This was discussed with Dr. Berneice Heinrich from urology and patient is to follow-up with himc  History of stroke x2: 01/2010 - L MCA infarct w/ hemorrhagic transformation in setting  of INR 4.16. refused to go to f/u MD  or therapy appts but did take meds as prescribed after stroke in July 2011. 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 lvef low at 25-305 like before  Hypertension: Blood pressure fairly controlled. Continue Flomax mainly for bladder outlet obstruction and also to help with blood pressure control. Home meds on hold for now-and has been discontinued.  Atrial fibrillation and flutter:Currently normal sinus rhythm. Patient has been felt to be poor candidate- has appointment with Dr Graciela Husbands in Jan and will need to f/u with him and have Dr. Pearlean Brownie to discuss if he qualifies for DOAC anticoagulation in light of another stroke. He has been followed by cardiology. I again discussed Dr. Roda Shutters from neurology 12/14- at this time recommendation is to continue aspirin and follow-up with his cardiology and neurology to discuss more.  Diabetes mellitus type 2 uncontrolled A1c 7.3, goal less than 7: Blood sugar is controlled continue 20 U Lantus and continue sliding scale insulin at the skilled nursing facility. At home uses 10 units if 200, And if>250 uses 20 units, uses twice a day.  HLD: On crestor 10 mg at home,cont, ldl goal <70, now at 108  DNR: present on admission.  Incontinence of urine/bowel for 3 months And mostly bedbound barely able get to bathroom with walker.  Pressure Ulcer:  Pressure Injury 05/27/19 Buttocks Right;Left Stage II - Partial thickness loss of dermis presenting as a shallow open ulcer with a red, pink wound bed without slough. 2 dime size open areas with no drainage noted (Active)  05/27/19 1623  Location: Buttocks  Location Orientation: Right;Left  Staging: Stage II - Partial thickness loss of dermis presenting as a shallow open ulcer  with a red, pink wound bed without slough.  Wound Description (Comments): 2 dime size open areas with no drainage noted  Present on Admission: Yes  There is no height or weight on file to calculate BMI.  DVT prophylaxis:SCD  Code Status:DNR.  Family Communica:Pt's wife aware about plan for rehabilitation.  Disposition Plan: SNF Today. covid 19 neg 12/14  Consultants:  Procedures:  CT head CTA head and neck  1. Acute infarct right superior cerebellum with hemorrhagic  transformation unchanged.  2. 20% diameter stenosis proximal right internal carotid artery and  35% diameter stenosis proximal left internal carotid artery.  3. Left vertebral artery dominant. Mild calcific stenosis at the  origin of the left vertebral artery.  4. No significant intracranial stenosis.  TTE 05/27/19.  1. Left ventricular ejection fraction, by visual estimation, is 25 to 30%. The left ventricle has severely decreased function. There is no left ventricular hypertrophy.  2. No obvious LV thrombus seen on this exam. Would recommend a limited contrast study to effectively rule this out given the patient's history of stroke.  3. Basal and mid inferolateral wall and apical inferior segment are abnormal.  4. Mildly dilated left ventricular internal cavity size.  5. The left ventricle demonstrates global hypokinesis.  6. Global right ventricle has normal systolic function.The right ventricular size is normal. No increase in right ventricular wall thickness.  7. Left atrial size was normal.  8. Right atrial size was normal.  9. Presence of pericardial fat pad.  10. Mild mitral annular calcification.  11. The mitral valve is degenerative. Mild mitral valve regurgitation.  12. The tricuspid valve is grossly normal. Tricuspid valve regurgitation is trivial.  13. The aortic valve is tricuspid. Aortic valve regurgitation is not visualized. Mild to moderate aortic valve sclerosis/calcification without any evidence of aortic  stenosis.  14. The pulmonic valve was grossly normal. Pulmonic valve regurgitation is not visualized.  15. TR signal is inadequate for assessing pulmonary artery systolic pressure.  16. A pacer wire is visualized in the RA and RV.  17. The inferior vena cava is normal in size with greater than 50% respiratory variability, suggesting right atrial pressure of 3 mmHg.  18. A prior study was performed on 08/12/2018.  19. No change from prior study.  Renal Ultrasound 05/31/2019  1. No acute sonographic abnormality detected.  2. Thickened urinary bladder wall which may be secondary to cystitis.  or chronic outlet obstruction.  3. Prostatomegaly- The prostate gland is enlarged measuring 4.8 x 3.1 x 3.3 cm    Discharge Instructions  Discharge Instructions    Ambulatory referral to Neurology   Complete by: As directed    Follow up with Dr. Pearlean Brownie at [facility] in 4-6 weeks. Too complicated for NP to follow. Thanks.   Diet Carb Modified   Complete by: As directed    Discharge instructions   Complete by: As directed    Please call call MD or return to ER for similar or worsening recurring problem that brought you to hospital or if any fever,nausea/vomiting,abdominal pain, uncontrolled pain, chest pain,  shortness of breath or any other alarming symptoms.  Please follow-up your doctor as instructed in a week time and call the office for appointment.  Please avoid alcohol, smoking, or any other illicit substance and maintain healthy habits including taking your regular medications as prescribed.  You were cared for by a hospitalist during your hospital stay. If you have any questions about your discharge medications or the care you received while you were in the hospital after you are discharged, you can call the unit and ask to speak with the hospitalist on call if the hospitalist that took care of you is not available.  Once you are discharged, your primary care physician will handle any further medical  issues. Please note that NO REFILLS for any discharge medications will be authorized once you are discharged, as it is imperative that you return to your primary care physician (or establish a relationship with a primary care physician if you do not have one) for your aftercare needs so that they can reassess your need for medications and monitor your lab values Please Follow-up Dr. Emmaline Life office for Foley catheter and voiding trial management in 3 to 4 days. Follow-up Dr. Graciela Husbands from cardiology in January as scheduled and with neurology post discharge follow-up   Increase activity slowly   Complete by: As directed      Allergies as of 06/02/2019      Reactions   Ace Inhibitors Cough   Betadine [povidone Iodine]    Warfarin And Related Other (See Comments)   BLEEDING (NON-SPECIFIC)      Medication List    STOP taking these medications   insulin NPH-regular Human (70-30) 100 UNIT/ML injection   losartan 25 MG tablet Commonly known as: COZAAR   metoprolol succinate 25 MG 24 hr tablet Commonly known as: TOPROL-XL     TAKE these medications   aspirin EC 81 MG tablet Take 81 mg by mouth daily.   finasteride 5 MG  tablet Commonly known as: Proscar Take 1 tablet (5 mg total) by mouth daily.   insulin glargine 100 UNIT/ML injection Commonly known as: Lantus Inject 0.2 mLs (20 Units total) into the skin at bedtime. Sliding Scale What changed: how much to take   PreserVision AREDS 2 Caps Take 1 capsule by mouth daily.   rosuvastatin 10 MG tablet Commonly known as: CRESTOR Take 1 tablet (10 mg total) by mouth 3 (three) times a week. What changed: additional instructions   sotalol 80 MG tablet Commonly known as: BETAPACE Take 1  1/2 tab 2 times daily NEED OFFICE VISIT FOR MORE REFILLS What changed:   how much to take  how to take this  when to take this   tamsulosin 0.4 MG Caps capsule Commonly known as: FLOMAX Take 1 capsule (0.4 mg total) by mouth daily.    triamcinolone cream 0.1 % Commonly known as: KENALOG Apply 1 application topically 4 (four) times daily as needed for rash.       Contact information for follow-up providers    Micki Riley, MD. Schedule an appointment as soon as possible for a visit in 4 week(s).   Specialties: Neurology, Radiology Contact information: [address] [place] [address] [phone]        Sebastian Ache, MD. Call in 4 day(s).   Specialty: Urology Why: Regarding management of Foley and voiding trial Contact information: [address] [place] [address] [phone]            Contact information for after-discharge care    Destination    [facility] Preferred SNF .   Service: Skilled Nursing Contact information: [address] [place] [address] [phone]                 Allergies  Allergen Reactions  . Ace Inhibitors Cough  . Betadine [Povidone Iodine]   . Warfarin And Related Other (See Comments)    BLEEDING (NON-SPECIFIC)    Procedures/Studies: CT ANGIO HEAD W OR WO CONTRAST  Result Date: 05/28/2019 CLINICAL DATA:  Follow-up stroke. EXAM: CT ANGIOGRAPHY HEAD AND NECK TECHNIQUE: Multidetector CT imaging of the head and neck was performed using the standard protocol during bolus administration of intravenous contrast. Multiplanar CT image reconstructions and MIPs were obtained to evaluate the vascular anatomy. Carotid stenosis measurements (when applicable) are obtained utilizing NASCET criteria, using the distal internal carotid diameter as the denominator. CONTRAST:  50mL OMNIPAQUE IOHEXOL 350 MG/ML SOLN COMPARISON:  CT head 05/26/2019 FINDINGS: CT HEAD FINDINGS Brain: Hypodensity in the right superior cerebellum similar in size. Associated high-density hemorrhage within the hypodensity, also unchanged. Findings most compatible with acute infarct with hemorrhage. Chronic infarcts in the right frontal parietal lobe.  Larger area of chronic infarct in the left temporoparietal lobe. Generalized atrophy without hydrocephalus. Vascular: Negative for hyperdense vessel Skull: Negative Sinuses: Negative Orbits: Negative Review of the MIP images confirms the above findings CTA NECK FINDINGS Aortic arch: Standard branching. Imaged portion shows no evidence of aneurysm or dissection. No significant stenosis of the major arch vessel origins. Atherosclerotic calcification in the aortic arch and proximal great vessels. Right carotid system: Atherosclerotic calcification right carotid bifurcation. Estimated 20% diameter stenosis right internal carotid artery. Calcified plaque right common carotid artery without significant stenosis. Left carotid system: Atherosclerotic calcification left carotid bifurcation. 35% diameter stenosis proximal left internal carotid artery. Vertebral arteries: Left vertebral artery dominant. Mild calcific stenosis at the origin. Right vertebral artery is patent without significant stenosis.  Skeleton: Cervical spondylosis. No acute skeletal abnormality Other neck: Negative for mass or soft tissue swelling in the neck. Upper chest: Lung apices clear bilaterally. Pacemaker pack in the left chest. Median sternotomy. Review of the MIP images confirms the above findings CTA HEAD FINDINGS Anterior circulation: Cavernous carotid widely patent bilaterally. Anterior and middle cerebral arteries patent bilaterally without stenosis. Posterior circulation: Both vertebral arteries patent to the basilar. PICA patent bilaterally. Basilar widely patent. Superior cerebellar and posterior cerebral arteries patent bilaterally. Fetal origin of the posterior cerebral artery bilaterally. Venous sinuses: Normal venous enhancement. Anatomic variants: None Review of the MIP images confirms the above findings IMPRESSION: 1. Acute infarct right superior cerebellum with hemorrhagic transformation unchanged. 2. 20% diameter stenosis proximal  right internal carotid artery and 35% diameter stenosis proximal left internal carotid artery. 3. Left vertebral artery dominant. Mild calcific stenosis at the origin of the left vertebral artery. 4. No significant intracranial stenosis. Electronically Signed   By: Marlan Palau M.D.   On: 05/28/2019 12:40   CT HEAD WO CONTRAST  Result Date: 05/26/2019 CLINICAL DATA:  Multiple falls, weakness EXAM: CT HEAD WITHOUT CONTRAST TECHNIQUE: Contiguous axial images were obtained from the base of the skull through the vertex without intravenous contrast. COMPARISON:  2011 FINDINGS: Brain: There is an area of low attenuation with superimposed increased density within the right cerebellar hemisphere extending into the vermis. No significant mass effect. There are chronic infarctions of the right frontoparietal lobes and left temporoparietal lobes with associated volume loss. Prominence of the ventricles and sulci reflects superimposed generalized parenchymal volume loss. Additional patchy hypoattenuation in the supratentorial white matter is nonspecific but probably reflects mild chronic microvascular ischemic changes. Vascular: No hyperdense vessel or unexpected calcification. Skull: Calvarium is unremarkable. Sinuses/Orbits: Mild paranasal sinus mucosal thickening. No acute intraorbital abnormality. Other: Mastoid air cells are clear. IMPRESSION: Likely subacute right cerebellar infarction with mild reperfusion hemorrhage. No significant mass effect. Follow-up CT or MR imaging is recommended to exclude a mass. Chronic/nonemergent findings detailed above. Electronically Signed   By: Guadlupe Spanish M.D.   On: 05/26/2019 10:30   CT ANGIO NECK W OR WO CONTRAST  Result Date: 05/28/2019 CLINICAL DATA:  Follow-up stroke. EXAM: CT ANGIOGRAPHY HEAD AND NECK TECHNIQUE: Multidetector CT imaging of the head and neck was performed using the standard protocol during bolus administration of intravenous contrast. Multiplanar CT  image reconstructions and MIPs were obtained to evaluate the vascular anatomy. Carotid stenosis measurements (when applicable) are obtained utilizing NASCET criteria, using the distal internal carotid diameter as the denominator. CONTRAST:  50mL OMNIPAQUE IOHEXOL 350 MG/ML SOLN COMPARISON:  CT head 05/26/2019 FINDINGS: CT HEAD FINDINGS Brain: Hypodensity in the right superior cerebellum similar in size. Associated high-density hemorrhage within the hypodensity, also unchanged. Findings most compatible with acute infarct with hemorrhage. Chronic infarcts in the right frontal parietal lobe. Larger area of chronic infarct in the left temporoparietal lobe. Generalized atrophy without hydrocephalus. Vascular: Negative for hyperdense vessel Skull: Negative Sinuses: Negative Orbits: Negative Review of the MIP images confirms the above findings CTA NECK FINDINGS Aortic arch: Standard branching. Imaged portion shows no evidence of aneurysm or dissection. No significant stenosis of the major arch vessel origins. Atherosclerotic calcification in the aortic arch and proximal great vessels. Right carotid system: Atherosclerotic calcification right carotid bifurcation. Estimated 20% diameter stenosis right internal carotid artery. Calcified plaque right common carotid artery without significant stenosis. Left carotid system: Atherosclerotic calcification left carotid bifurcation. 35% diameter stenosis proximal left internal carotid artery. Vertebral arteries:  Left vertebral artery dominant. Mild calcific stenosis at the origin. Right vertebral artery is patent without significant stenosis. Skeleton: Cervical spondylosis. No acute skeletal abnormality Other neck: Negative for mass or soft tissue swelling in the neck. Upper chest: Lung apices clear bilaterally. Pacemaker pack in the left chest. Median sternotomy. Review of the MIP images confirms the above findings CTA HEAD FINDINGS Anterior circulation: Cavernous carotid widely  patent bilaterally. Anterior and middle cerebral arteries patent bilaterally without stenosis. Posterior circulation: Both vertebral arteries patent to the basilar. PICA patent bilaterally. Basilar widely patent. Superior cerebellar and posterior cerebral arteries patent bilaterally. Fetal origin of the posterior cerebral artery bilaterally. Venous sinuses: Normal venous enhancement. Anatomic variants: None Review of the MIP images confirms the above findings IMPRESSION: 1. Acute infarct right superior cerebellum with hemorrhagic transformation unchanged. 2. 20% diameter stenosis proximal right internal carotid artery and 35% diameter stenosis proximal left internal carotid artery. 3. Left vertebral artery dominant. Mild calcific stenosis at the origin of the left vertebral artery. 4. No significant intracranial stenosis. Electronically Signed   By: Marlan Palau M.D.   On: 05/28/2019 12:40   US Renal  Result Date: 05/31/2019 CLINICAL DATA:  Acute kidney injury EXAM: RENAL / URINARY TRACT ULTRASOUND COMPLETE COMPARISON:  None. FINDINGS: Right Kidney: Renal measurements: 11.7 x 5.5 x 5.3 cm = volume: 178 mL . Echogenicity within normal limits. No mass or hydronephrosis visualized. Left Kidney: Renal measurements: 10 x 5.1 x 6.1 cm = volume: 162 mL. Echogenicity within normal limits. No mass or hydronephrosis visualized. Bladder: There is a Foley catheter in place.  The bladder wall is thickened. Other: The prostate gland is enlarged measuring 4.8 x 3.1 x 3.3 cm. IMPRESSION: 1. No acute sonographic abnormality detected. 2. Thickened urinary bladder wall which may be secondary to cystitis or chronic outlet obstruction. Correlation with urinalysis is recommended. 3. Prostatomegaly. Electronically Signed   By: Katherine Mantle M.D.   On: 05/31/2019 18:48   DG CHEST PORT 1 VIEW  Result Date: 05/27/2019 CLINICAL DATA:  Altered mental status. EXAM: PORTABLE CHEST 1 VIEW COMPARISON:  Radiographs 03/20/2011 and  01/23/2010. FINDINGS: 1632 hours. The heart size and mediastinal contours are stable status post median sternotomy and CABG. Left subclavian AICD lead projects to the right ventricular apex. The lungs are clear. There is no pleural effusion or pneumothorax. No acute osseous findings. IMPRESSION: Stable postoperative chest. No active cardiopulmonary process. Electronically Signed   By: Carey Bullocks M.D.   On: 05/27/2019 16:42   ECHOCARDIOGRAM COMPLETE  Result Date: 05/27/2019   ECHOCARDIOGRAM REPORT   Patient Name:   ISAAK DELMUNDO Date of Exam: 05/27/2019 Medical Rec #:  [id number]     Height:       67.0 in Accession #:    [id number]    Weight:       162.6 lb Date of Birth:  1941-01-12      BSA:          1.85 m Patient Age:    78 years      BP:           99/55 mmHg Patient Gender: M             HR:           68 bpm. Exam Location:  Inpatient Procedure: 2D Echo Indications:    Stroke 434.91 / I163.9  History:        Patient has prior history of Echocardiogram examinations, most  recent 08/12/2018. CAD, Stroke, Arrythmias:Atrial Fibrillation                 and Atrial Flutter; Risk Factors:Former Smoker. Ventricular                 Tachycardia, ICD, DNR, Ischemic Cardiomyopathy.  Sonographer:    Leavy Cella Referring Phys: [place]  1. Left ventricular ejection fraction, by visual estimation, is 25 to 30%. The left ventricle has severely decreased function. There is no left ventricular hypertrophy.  2. No obvious LV thrombus seen on this exam. Would recommend a limited contrast study to effectively rule this out given the patient's history of stroke.  3. Basal and mid inferolateral wall and apical inferior segment are abnormal.  4. Mildly dilated left ventricular internal cavity size.  5. The left ventricle demonstrates global hypokinesis.  6. Global right ventricle has normal systolic function.The right ventricular size is normal. No increase in right ventricular wall  thickness.  7. Left atrial size was normal.  8. Right atrial size was normal.  9. Presence of pericardial fat pad. 10. Mild mitral annular calcification. 11. The mitral valve is degenerative. Mild mitral valve regurgitation. 12. The tricuspid valve is grossly normal. Tricuspid valve regurgitation is trivial. 13. The aortic valve is tricuspid. Aortic valve regurgitation is not visualized. Mild to moderate aortic valve sclerosis/calcification without any evidence of aortic stenosis. 14. The pulmonic valve was grossly normal. Pulmonic valve regurgitation is not visualized. 15. TR signal is inadequate for assessing pulmonary artery systolic pressure. 16. A pacer wire is visualized in the RA and RV. 17. The inferior vena cava is normal in size with greater than 50% respiratory variability, suggesting right atrial pressure of 3 mmHg. 18. A prior study was performed on 08/12/2018. 19. No change from prior study. FINDINGS  Left Ventricle: Left ventricular ejection fraction, by visual estimation, is 25 to 30%. The left ventricle has severely decreased function. The left ventricle demonstrates global hypokinesis. The left ventricular internal cavity size was mildly dilated left ventricle. There is no left ventricular hypertrophy. No obvious LV thrombus seen on this exam. Would recommend a limited contrast study to effectively rule this out given the patient's history of stroke.  LV Wall Scoring: The basal and mid inferolateral wall and apical inferior segment are akinetic. Right Ventricle: The right ventricular size is normal. No increase in right ventricular wall thickness. Global RV systolic function is has normal systolic function. Left Atrium: Left atrial size was normal in size. Right Atrium: Right atrial size was normal in size Pericardium: There is no evidence of pericardial effusion. Presence of pericardial fat pad. Mitral Valve: The mitral valve is degenerative in appearance. Mild mitral annular calcification. Mild  mitral valve regurgitation. Tricuspid Valve: The tricuspid valve is grossly normal. Tricuspid valve regurgitation is trivial. Aortic Valve: The aortic valve is tricuspid. Aortic valve regurgitation is not visualized. Mild to moderate aortic valve sclerosis/calcification is present, without any evidence of aortic stenosis. Pulmonic Valve: The pulmonic valve was grossly normal. Pulmonic valve regurgitation is not visualized. Pulmonic regurgitation is not visualized. Aorta: The aortic root is normal in size and structure. Venous: The inferior vena cava is normal in size with greater than 50% respiratory variability, suggesting right atrial pressure of 3 mmHg. IAS/Shunts: No atrial level shunt detected by color flow Doppler. Additional Comments: A prior study was performed on 08/12/2018. A pacer wire is visualized in the right atrium and right ventricle.  LEFT VENTRICLE PLAX 2D LVIDd:  5.90 cm  Diastology LVIDs:         5.20 cm  LV e' lateral:   6.50 cm/s LV PW:         0.90 cm  LV E/e' lateral: 7.4 LV IVS:        1.00 cm  LV e' medial:    4.75 cm/s LVOT diam:     2.10 cm  LV E/e' medial:  10.1 LV SV:         44 ml LV SV Index:   23.26 LVOT Area:     3.46 cm  RIGHT VENTRICLE TAPSE (M-mode): 1.2 cm LEFT ATRIUM             Index       RIGHT ATRIUM           Index LA diam:        4.20 cm 2.27 cm/m  RA Area:     12.70 cm LA Vol (A2C):   67.1 ml 36.23 ml/m RA Volume:   29.30 ml  15.82 ml/m LA Vol (A4C):   52.0 ml 28.08 ml/m LA Biplane Vol: 61.6 ml 33.26 ml/m   AORTA Ao Root diam: 3.70 cm MITRAL VALVE MV Area (PHT): 3.72 cm             SHUNTS MV PHT:        59.16 msec           Systemic Diam: 2.10 cm MV Decel Time: 204 msec MV E velocity: 48.00 cm/s 103 cm/s MV A velocity: 36.00 cm/s 70.3 cm/s MV E/A ratio:  1.33       1.5  Lennie Odor MD Electronically signed by Lennie Odor MD Signature Date/Time: 05/27/2019/6:07:40 PM    Final    VAS US CAROTID (at [facility] and [facility] only)  Result Date: 05/26/2019 Carotid  Arterial Duplex Study Indications:       CVA. Risk Factors:      Hypertension, hyperlipidemia, Diabetes, coronary artery                    disease, prior CVA. Comparison Study:  no prior Performing Technologist: Blanch Media RVS  Examination Guidelines: A complete evaluation includes B-mode imaging, spectral Doppler, color Doppler, and power Doppler as needed of all accessible portions of each vessel. Bilateral testing is considered an integral part of a complete examination. Limited examinations for reoccurring indications may be performed as noted.  Right Carotid Findings: +----------+--------+--------+--------+------------------+--------+           PSV cm/sEDV cm/sStenosisPlaque DescriptionComments +----------+--------+--------+--------+------------------+--------+ CCA Prox  50      10              heterogenous               +----------+--------+--------+--------+------------------+--------+ CCA Distal58      11              heterogenous               +----------+--------+--------+--------+------------------+--------+ ICA Prox  120     19      1-39%   heterogenous               +----------+--------+--------+--------+------------------+--------+ ICA Distal80      21                                         +----------+--------+--------+--------+------------------+--------+ ECA  75                                                 +----------+--------+--------+--------+------------------+--------+ +----------+--------+-------+--------+-------------------+           PSV cm/sEDV cmsDescribeArm Pressure (mmHG) +----------+--------+-------+--------+-------------------+ [id number]                                        +----------+--------+-------+--------+-------------------+ +---------+--------+--+--------+-+---------+ VertebralPSV cm/s40EDV cm/s8Antegrade +---------+--------+--+--------+-+---------+  Left Carotid Findings:  +----------+--------+--------+--------+------------------+---------+           PSV cm/sEDV cm/sStenosisPlaque DescriptionComments  +----------+--------+--------+--------+------------------+---------+ CCA Prox  69      12              heterogenous                +----------+--------+--------+--------+------------------+---------+ CCA Distal75      17              heterogenous                +----------+--------+--------+--------+------------------+---------+ ICA Prox  97      24      1-39%   heterogenous      Shadowing +----------+--------+--------+--------+------------------+---------+ ICA Distal71      17                                          +----------+--------+--------+--------+------------------+---------+ ECA       83                                                  +----------+--------+--------+--------+------------------+---------+ +----------+--------+--------+--------+-------------------+           PSV cm/sEDV cm/sDescribeArm Pressure (mmHG) +----------+--------+--------+--------+-------------------+ Subclavian109                                         +----------+--------+--------+--------+-------------------+ +---------+--------+--+--------+-+---------+ VertebralPSV cm/s29EDV cm/s5Antegrade +---------+--------+--+--------+-+---------+  Summary:   *See table(s) above for measurements and observations.  Electronically signed by Coral Else MD on 05/26/2019 at 5:21:33 PM.    Final      Subjective: At baseline, slowed dysarthric speech, ineractive, follows instructions  Discharge Exam: Vitals:   06/02/19 1215 06/02/19 1700  BP: (!) 107/57 (!) 103/51  Pulse: 71 73  Resp: 16 (!) 21  Temp: (!) 97.5 F (36.4 C) (!) 97.4 F (36.3 C)  SpO2: 99% 98%   Vitals:   06/02/19 0917 06/02/19 0928 06/02/19 1215 06/02/19 1700  BP: (!) 117/59 (!) 117/59 (!) 107/57 (!) 103/51  Pulse: 78 78 71 73  Resp:  18 16 (!) 21  Temp:  98.2 F (36.8  C) (!) 97.5 F (36.4 C) (!) 97.4 F (36.3 C)  TempSrc:  Oral Oral Axillary  SpO2:  100% 99% 98%    General: Pt is alert, awake, not in acute distress Cardiovascular: RRR, S1/S2 +, no rubs, no gallops Respiratory: CTA bilaterally, no wheezing, no rhonchi Abdominal: Soft, NT, ND, bowel sounds + Extremities: no edema, no cyanosis  The  results of significant diagnostics from this hospitalization (including imaging, microbiology, ancillary and laboratory) are listed below for reference.     Microbiology: Recent Results (from the past 240 hour(s))  SARS CORONAVIRUS 2 (TAT 6-24 HRS) Nasopharyngeal Nasopharyngeal Swab     Status: None   Collection Time: 05/26/19  3:19 PM   Specimen: Nasopharyngeal Swab  Result Value Ref Range Status   SARS Coronavirus 2 NEGATIVE NEGATIVE Final    Comment: (NOTE) SARS-CoV-2 target nucleic acids are NOT DETECTED. The SARS-CoV-2 RNA is generally detectable in upper and lower respiratory specimens during the acute phase of infection. Negative results do not preclude SARS-CoV-2 infection, do not rule out co-infections with other pathogens, and should not be used as the sole basis for treatment or other patient management decisions. Negative results must be combined with clinical observations, patient history, and epidemiological information. The expected result is Negative. Fact Sheet for Patients: HairSlick.nohttps://www.fda.gov/media/138098/download Fact Sheet for Healthcare Providers: quierodirigir.comhttps://www.fda.gov/media/138095/download This test is not yet approved or cleared by the [place] FDA and  has been authorized for detection and/or diagnosis of SARS-CoV-2 by FDA under an Emergency Use Authorization (EUA). This EUA will remain  in effect (meaning this test can be used) for the duration of the COVID-19 declaration under Section 56 4(b)(1) of the Act, 21 U.S.C. section 360bbb-3(b)(1), unless the authorization is terminated or revoked sooner. Performed at  [facility], [address] [place], [place] [address]   SARS CORONAVIRUS 2 (TAT 6-24 HRS) Nasopharyngeal Nasopharyngeal Swab     Status: None   Collection Time: 06/01/19 10:50 AM   Specimen: Nasopharyngeal Swab  Result Value Ref Range Status   SARS Coronavirus 2 NEGATIVE NEGATIVE Final    Comment: (NOTE) SARS-CoV-2 target nucleic acids are NOT DETECTED. The SARS-CoV-2 RNA is generally detectable in upper and lower respiratory specimens during the acute phase of infection. Negative results do not preclude SARS-CoV-2 infection, do not rule out co-infections with other pathogens, and should not be used as the sole basis for treatment or other patient management decisions. Negative results must be combined with clinical observations, patient history, and epidemiological information. The expected result is Negative. Fact Sheet for Patients: HairSlick.nohttps://www.fda.gov/media/138098/download Fact Sheet for Healthcare Providers: quierodirigir.comhttps://www.fda.gov/media/138095/download This test is not yet approved or cleared by the [place] FDA and  has been authorized for detection and/or diagnosis of SARS-CoV-2 by FDA under an Emergency Use Authorization (EUA). This EUA will remain  in effect (meaning this test can be used) for the duration of the COVID-19 declaration under Section 56 4(b)(1) of the Act, 21 U.S.C. section 360bbb-3(b)(1), unless the authorization is terminated or revoked sooner. Performed at [facility], [address] [place], [place] [address]      Labs: BNP (last 3 results) No results for input(s): BNP in the last 8760 hours. Basic Metabolic Panel: Recent Labs  Lab 05/29/19 0220 05/30/19 0424 05/31/19 0222 05/31/19 1636 06/01/19 0259  NA 144 143 146* 144 144  K 3.6 3.0* 3.0* 3.3* 3.3*  CL 110 109 106 108 109  CO2 22 23 25 23 24   GLUCOSE 157* 148* 159* 179* 142*  BUN 35* 32* 28* 27* 25*  CREATININE 1.67* 1.24 1.31* 0.89 0.78  CALCIUM 9.1 8.6* 9.1 8.9  8.8*  MG  --   --  1.9  --  1.6*  PHOS  --   --   --  3.8 3.8   Liver Function Tests: Recent Labs  Lab 05/31/19 1636 06/01/19 0259  ALBUMIN  3.0* 2.9*   No results for input(s): LIPASE, AMYLASE in the last 168 hours. No results for input(s): AMMONIA in the last 168 hours. CBC: Recent Labs  Lab 05/28/19 0417 05/29/19 0220 05/30/19 0424 05/31/19 0222  WBC 16.0* 13.5* 9.5 11.1*  HGB 14.3 13.8 12.9* 13.6  HCT 42.3 40.1 39.0 40.7  MCV 95.5 94.6 96.3 95.5  PLT 178 170 156 168   Cardiac Enzymes: No results for input(s): CKTOTAL, CKMB, CKMBINDEX, TROPONINI in the last 168 hours. BNP: Invalid input(s): POCBNP CBG: Recent Labs  Lab 06/01/19 1642 06/01/19 2109 06/02/19 0556 06/02/19 1211 06/02/19 1648  GLUCAP 186* 174* 193* 165* 138*   D-Dimer No results for input(s): DDIMER in the last 72 hours. Hgb A1c No results for input(s): HGBA1C in the last 72 hours. Lipid Profile No results for input(s): CHOL, HDL, LDLCALC, TRIG, CHOLHDL, LDLDIRECT in the last 72 hours. Thyroid function studies No results for input(s): TSH, T4TOTAL, T3FREE, THYROIDAB in the last 72 hours.  Invalid input(s): FREET3 Anemia work up No results for input(s): VITAMINB12, FOLATE, FERRITIN, TIBC, IRON, RETICCTPCT in the last 72 hours. Urinalysis    Component Value Date/Time   COLORURINE RED (A) 05/31/2019 1853   APPEARANCEUR TURBID (A) 05/31/2019 1853   LABSPEC  05/31/2019 1853    TEST NOT REPORTED DUE TO COLOR INTERFERENCE OF URINE PIGMENT   PHURINE  05/31/2019 1853    TEST NOT REPORTED DUE TO COLOR INTERFERENCE OF URINE PIGMENT   GLUCOSEU (A) 05/31/2019 1853    TEST NOT REPORTED DUE TO COLOR INTERFERENCE OF URINE PIGMENT   HGBUR (A) 05/31/2019 1853    TEST NOT REPORTED DUE TO COLOR INTERFERENCE OF URINE PIGMENT   BILIRUBINUR (A) 05/31/2019 1853    TEST NOT REPORTED DUE TO COLOR INTERFERENCE OF URINE PIGMENT   KETONESUR (A) 05/31/2019 1853    TEST NOT REPORTED DUE TO COLOR INTERFERENCE OF  URINE PIGMENT   PROTEINUR (A) 05/31/2019 1853    TEST NOT REPORTED DUE TO COLOR INTERFERENCE OF URINE PIGMENT   UROBILINOGEN 0.2 01/23/2010 1231   NITRITE (A) 05/31/2019 1853    TEST NOT REPORTED DUE TO COLOR INTERFERENCE OF URINE PIGMENT   LEUKOCYTESUR (A) 05/31/2019 1853    TEST NOT REPORTED DUE TO COLOR INTERFERENCE OF URINE PIGMENT   Sepsis Labs Invalid input(s): PROCALCITONIN,  WBC,  LACTICIDVEN Microbiology Recent Results (from the past 240 hour(s))  SARS CORONAVIRUS 2 (TAT 6-24 HRS) Nasopharyngeal Nasopharyngeal Swab     Status: None   Collection Time: 05/26/19  3:19 PM   Specimen: Nasopharyngeal Swab  Result Value Ref Range Status   SARS Coronavirus 2 NEGATIVE NEGATIVE Final    Comment: (NOTE) SARS-CoV-2 target nucleic acids are NOT DETECTED. The SARS-CoV-2 RNA is generally detectable in upper and lower respiratory specimens during the acute phase of infection. Negative results do not preclude SARS-CoV-2 infection, do not rule out co-infections with other pathogens, and should not be used as the sole basis for treatment or other patient management decisions. Negative results must be combined with clinical observations, patient history, and epidemiological information. The expected result is Negative. Fact Sheet for Patients: HairSlick.no Fact Sheet for Healthcare Providers: quierodirigir.com This test is not yet approved or cleared by the [place] FDA and  has been authorized for detection and/or diagnosis of SARS-CoV-2 by FDA under an Emergency Use Authorization (EUA). This EUA will remain  in effect (meaning this test can be used) for the duration of the COVID-19 declaration under Section 56 4(b)(1) of the  Act, 21 U.S.C. section 360bbb-3(b)(1), unless the authorization is terminated or revoked sooner. Performed at [facility], [address], [place], [place] [address]   SARS CORONAVIRUS 2 (TAT 6-24  HRS) Nasopharyngeal Nasopharyngeal Swab     Status: None   Collection Time: 06/01/19 10:50 AM   Specimen: Nasopharyngeal Swab  Result Value Ref Range Status   SARS Coronavirus 2 NEGATIVE NEGATIVE Final    Comment: (NOTE) SARS-CoV-2 target nucleic acids are NOT DETECTED. The SARS-CoV-2 RNA is generally detectable in upper and lower respiratory specimens during the acute phase of infection. Negative results do not preclude SARS-CoV-2 infection, do not rule out co-infections with other pathogens, and should not be used as the sole basis for treatment or other patient management decisions. Negative results must be combined with clinical observations, patient history, and epidemiological information. The expected result is Negative. Fact Sheet for Patients: HairSlick.no Fact Sheet for Healthcare Providers: quierodirigir.com This test is not yet approved or cleared by the [place] FDA and  has been authorized for detection and/or diagnosis of SARS-CoV-2 by FDA under an Emergency Use Authorization (EUA). This EUA will remain  in effect (meaning this test can be used) for the duration of the COVID-19 declaration under Section 56 4(b)(1) of the Act, 21 U.S.C. section 360bbb-3(b)(1), unless the authorization is terminated or revoked sooner. Performed at [facility], [address] [place], [place] [address]      Time coordinating discharge: 35 minutes  SIGNED:   Lanae Boast, MD  [facility] 06/03/2019, 12:11 PM  If 7PM-7AM, please contact night-coverage [email or web address]

## 2019-06-22 ENCOUNTER — Ambulatory Visit (INDEPENDENT_AMBULATORY_CARE_PROVIDER_SITE_OTHER): Payer: Medicare Other | Admitting: *Deleted

## 2019-06-22 DIAGNOSIS — I4891 Unspecified atrial fibrillation: Secondary | ICD-10-CM | POA: Diagnosis not present

## 2019-06-22 DIAGNOSIS — I4892 Unspecified atrial flutter: Secondary | ICD-10-CM

## 2019-06-23 LAB — CUP PACEART REMOTE DEVICE CHECK
Battery Remaining Longevity: 54 mo
Battery Remaining Percentage: 55 %
Brady Statistic RV Percent Paced: 1 %
Date Time Interrogation Session: 20210104231100
HighPow Impedance: 61 Ohm
Implantable Lead Implant Date: 19970327
Implantable Lead Location: 753860
Implantable Lead Model: 125
Implantable Lead Serial Number: 209616
Implantable Pulse Generator Implant Date: 20110726
Lead Channel Impedance Value: 658 Ohm
Lead Channel Pacing Threshold Amplitude: 2 V
Lead Channel Pacing Threshold Pulse Width: 1.5 ms
Lead Channel Setting Pacing Amplitude: 4 V
Lead Channel Setting Pacing Pulse Width: 1.5 ms
Lead Channel Setting Sensing Sensitivity: 0.5 mV
Pulse Gen Serial Number: 271249

## 2019-07-07 ENCOUNTER — Telehealth: Payer: Self-pay

## 2019-07-07 NOTE — Telephone Encounter (Signed)
LMOVM for pt to stop sending manual transmissions. I also left my direct office number for the pt to call if he has any questions. 

## 2019-07-28 ENCOUNTER — Ambulatory Visit: Payer: Medicare Other | Admitting: Neurology

## 2019-07-28 ENCOUNTER — Other Ambulatory Visit: Payer: Self-pay

## 2019-07-28 ENCOUNTER — Encounter: Payer: Self-pay | Admitting: Neurology

## 2019-07-28 VITALS — BP 99/63 | HR 107 | Temp 97.2°F | Ht 66.0 in | Wt 144.2 lb

## 2019-07-28 DIAGNOSIS — I69322 Dysarthria following cerebral infarction: Secondary | ICD-10-CM | POA: Diagnosis not present

## 2019-07-28 DIAGNOSIS — I639 Cerebral infarction, unspecified: Secondary | ICD-10-CM | POA: Diagnosis not present

## 2019-07-28 NOTE — Progress Notes (Signed)
[facility] [address] [place]. [place] [address] [phone]       OFFICE FOLLOW-UP NOTE  [name] Date of Birth:  06-Mar-1941 Medical Record Number:  [id number]   HPI: Tristan Myers is a 79 year old Caucasian male seen today for initial office follow-up visit following hospital admission for stroke in December 2020.  He is accompanied by his wife.  History is obtained from them and review of electronic medical records and I personally reviewed imaging films in PACS. He has a past medical history of atrial fibrillation/flutter and was previously on Coumadin but had issues with follow-up for monitoring INR and was admitted with a stroke in 2011 with INR of 4.16 and hemorrhagic left MCA infarct.  2 months prior and had previous right MCA embolic infarct and warfarin has been started.  Since then warfarin has been held due to poor follow-up and noncompliance.  He also has history of coronary artery disease, diabetes, hyperlipidemia, CABG, peripheral vascular disease, defibrillator placement.  He presented to [facility] on 05/26/2019 with a 4-day history of altered mental status with slurred speech and gait imbalance.  Patient states he was cutting wood outside on 05/23/2019 and complained of generalized weakness and fatigue states that a piece of wood hit his head.  However when he was seen in the ER there was no visible scalp swelling bleeding noted.  However CT scan of the head obtained showed subacute right cerebellar ischemic infarct with some petechial hemorrhagic conversion.  CT angiogram of the head and neck showed no large vessel stenosis or occlusion.  Carotid ultrasound showed 1-39% bilateral carotid stenosis.  2D echo showed diminished ejection fraction of 25 to 30% but no definite clot.  LDL cholesterol elevated at 108 mg percent and hemoglobin A1c at 7.3.  Patient had prior history of left MCA infarct with hemorrhagic transformation in setting of supratherapeutic  INR of 4.16 in August 2011 when he refused to go to his medical doctor to have INR checked.  In July 2011 he had a right MCA infarct with some residual speech difficulties at that time TEE was negative but he developed atrial flutter during the procedure and was started on warfarin.  His warfarin was discontinued in August 2011 and he has stayed on aspirin only since then.  During the recent admission also patient was felt to be high risk for falls and cognitive impairment as well as history of noncompliance and she was not switched to anticoagulation.  Patient states she is being at home since discharge.  He had one visit from physical and occupational therapist with patient did not get along with them and they stopped.  He is able to walk with a walker and can take a few steps unassisted even without it.  Continues to have severe slurred speech and times difficult to understand.  Short-term memory has also declined he needs help with most activities of daily living and his wife has to keep a close watch on him.  His blood pressure continues to run low.  He remains on Lantus insulin and states sugars are under good control.  The Metformin half tablet twice daily has been recently added.  He is also on Crestor which is tolerating well. ROS:   14 system review of systems is positive for slurred speech, difficulty in comprehension, memory, balance, easy bruising, falls and all systems negative PMH:  Past Medical History:  Diagnosis Date  . Atrial fibrillation and flutter (HCC)    on  coumadin  . CAD (coronary artery disease)    s/p ICD implant in 1997  . CVA (cerebral vascular accident) (HCC) 12/2009   LEFT PARIETAL WITH APHASIA / APRAXIA  . 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 Diabetes mellitus, type 2 (HCC)   . DVT (deep venous thrombosis) (HCC)    OF LEFT ARM FOLLOWING ICD PLACEMENT  . Glaucoma   . Hyperlipidemia   . ICD (implantable cardioverter-defibrillator)[employer]     . 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 Obesity   . PVD (peripheral vascular disease) (HCC)   . S/P CABG x 1   . Stroke [facility])    left brain CVA with aphasia 01/03/10  . Ventricular tachycardia (HCC)    Hx of it    Social History:  Social History   Socioeconomic History  . Marital status: Married    Spouse name: Not on file  . Number of children: Not on file  . Years of education: Not on file  . Highest education level: Not on file  Occupational History  . Not on file  Tobacco Use  . Smoking status: Former Games developer  . Smokeless tobacco: Never Used  . Tobacco comment: quit in 1997  Substance and Sexual Activity  . Alcohol use: No  . Drug use: Never  . Sexual activity: Not on file  Other Topics Concern  . Not on file  Social History Narrative   Married, disabled.    ICD-BS remote-no   Sees Dr. Donnie Aho         Social Determinants of Health   Financial Resource Strain:   . Difficulty of Paying Living Expenses: Not on file  Food Insecurity:   . Worried About Programme researcher, broadcasting/film/video in the Last Year: Not on file  . Ran Out of Food in the Last Year: Not on file  Transportation Needs:   . Lack of Transportation (Medical): Not on file  . Lack of Transportation (Non-Medical): Not on file  Physical Activity:   . Days of Exercise per Week: Not on file  . Minutes of Exercise per Session: Not on file  Stress:   . Feeling of Stress : Not on file  Social Connections:   . Frequency of Communication with Friends and Family: Not on file  . Frequency of Social Gatherings with Friends and Family: Not on file  . Attends Religious Services: Not on file  . Active Member of Clubs or Organizations: Not on file  . Attends Banker Meetings: Not on file  . Marital Status: Not on file  Intimate Partner Violence:   . Fear of Current or Ex-Partner: Not on file  . Emotionally Abused: Not on  file  . Physically Abused: Not on file  . Sexually Abused: Not on file    Medications:   Current Outpatient Medications on File Prior to Visit  Medication Sig Dispense Refill  . aspirin EC 81 MG tablet Take 81 mg by mouth daily.    . insulin glargine (LANTUS) 100 UNIT/ML injection Inject 0.2 mLs (20 Units total) into the skin at bedtime. Sliding Scale 10 mL 11  . losartan (COZAAR) 25 MG tablet Take 25 mg by mouth daily.    . metoprolol succinate (TOPROL-XL) 25 MG 24 hr tablet Take 25 mg by mouth daily.    . Multiple Vitamins-Minerals (PRESERVISION AREDS 2) CAPS Take 1 capsule by mouth daily.    . rosuvastatin (  CRESTOR) 10 MG tablet Take 1 tablet (10 mg total) by mouth 3 (three) times a week. (Patient taking differently: Take 10 mg by mouth 3 (three) times a week. Mon, Wed, Fri) 39 tablet 2  . sotalol (BETAPACE) 80 MG tablet Take 1  1/2 tab 2 times daily NEED OFFICE VISIT FOR MORE REFILLS (Patient taking differently: Take 120 mg by mouth 2 (two) times daily. Take 1  1/2 tab 2 times daily NEED OFFICE VISIT FOR MORE REFILLS) 270 tablet 3  . tamsulosin (FLOMAX) 0.4 MG CAPS capsule Take 1 capsule (0.4 mg total) by mouth daily. 30 capsule   . triamcinolone cream (KENALOG) 0.1 % Apply 1 application topically 4 (four) times daily as needed for rash.     No current facility-administered medications on file prior to visit.    Allergies:   Allergies  Allergen Reactions  . Ace Inhibitors Cough  . Betadine [Povidone Iodine]   . Warfarin And Related Other (See Comments)    BLEEDING (NON-SPECIFIC)    Physical Exam General: Frail elderly Caucasian male, seated, in no evident distress Head: head normocephalic and atraumatic.  Neck: supple with no carotid or supraclavicular bruits Cardiovascular: regular rate and rhythm, no murmurs Musculoskeletal: no deformity Skin:  no rash/petichiae Vascular:  Normal pulses all extremities Vitals:   07/28/19 1137  BP: 99/63  Pulse: (!) 107  Temp: (!) 97.2  F (36.2 C)   Neurologic Exam Mental Status: Awake and fully alert. Oriented to place but not to time. Recent and remote memory poor attention span, concentration and fund of knowledge diminished mood and affect appropriate.  Speech is severely dysarthric and difficult to understand.  Follows only simple midline and occasional one-step commands.  Comprehension of also seems affected. Cranial Nerves: Fundoscopic exam reveals sharp disc margins. Pupils equal, briskly reactive to light. Extraocular movements full without nystagmus but mild saccadic dysmetria on lateral gaze bilaterally.. Visual fields full to confrontation. Hearing diminished bilaterally. Facial sensation intact. Face, tongue, palate moves normally and symmetrically.  Motor: Normal bulk and tone. Normal strength in all tested extremity muscles. Sensory.: intact to touch ,pinprick .position and vibratory sensation.  Coordination: Rapid alternating movements normal in all extremities. Finger-to-nose and heel-to-shin performed accurately bilaterally. Gait and Station: Arises from chair with slight difficulty. Stance is slightly stooped. Gait demonstrates wide base and uses a walker.   Reflexes: 1+ and symmetric. Toes downgoing.   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  Remote history of by cerebral embolic infarcts in 6269 with patient having history of noncompliance with anticoagulation and being high risk for falls and dementia     PLAN: I had a long d/w patient about his recent stroke, risk for recurrent stroke/TIAs, personally independently reviewed imaging studies and stroke evaluation results and answered questions.Continue aspirin 81 mg daily  for secondary stroke prevention as he is too high risk for anticoagulation despite history of atrial fibrillation/flutter given prior  history of medication noncompliance, fall risk and dementia.  And maintain strict control of hypertension with blood pressure goal below 130/90, diabetes with hemoglobin A1c goal below 6.5% and lipids with LDL cholesterol goal below 70 mg/dL. I also advised the patient to eat a healthy diet with plenty of whole grains, cereals, fruits and vegetables, exercise regularly and maintain ideal body weight .patient was advised to use his walker at all times and we also discussed fall  safety precautions.  Referral for outpatient speech therapy physical and occupational therapy.  Followup in the future with my nurse practitioner Shanda Bumps in 6 months or call earlier if necessary. Greater than 50% of time during this 35 minute visit was spent on counseling,explanation of diagnosis, planning of further management, discussion with patient and family and coordination of care Delia Heady, MD Note: This document was prepared with digital dictation and possible smart phrase technology. Any transcriptional errors that result from this process are unintentional

## 2019-07-28 NOTE — Patient Instructions (Signed)
I had a long d/w patient about his recent stroke, risk for recurrent stroke/TIAs, personally independently reviewed imaging studies and stroke evaluation results and answered questions.Continue aspirin 81 mg daily  for secondary stroke prevention as he is too high risk for anticoagulation despite history of atrial fibrillation/flutter given prior history of medication noncompliance, fall risk and dementia.  And maintain strict control of hypertension with blood pressure goal below 130/90, diabetes with hemoglobin A1c goal below 6.5% and lipids with LDL cholesterol goal below 70 mg/dL. I also advised the patient to eat a healthy diet with plenty of whole grains, cereals, fruits and vegetables, exercise regularly and maintain ideal body weight .patient was advised to use his walker at all times and we also discussed fall safety precautions.  Referral for outpatient speech therapy physical and occupational therapy.  Followup in the future with my nurse practitioner Shanda Bumps in 6 months or call earlier if necessary.  Fall Prevention in the Home, Adult Falls can cause injuries. They can happen to people of all ages. There are many things you can do to make your home safe and to help prevent falls. Ask for help when making these changes, if needed. What actions can I take to prevent falls? General Instructions  Use good lighting in all rooms. Replace any light bulbs that burn out.  Turn on the lights when you go into a dark area. Use night-lights.  Keep items that you use often in easy-to-reach places. Lower the shelves around your home if necessary.  Set up your furniture so you have a clear path. Avoid moving your furniture around.  Do not have throw rugs and other things on the floor that can make you trip.  Avoid walking on wet floors.  If any of your floors are uneven, fix them.  Add color or contrast paint or tape to clearly mark and help you see: ? Any grab bars or handrails. ? First and last  steps of stairways. ? Where the edge of each step is.  If you use a stepladder: ? Make sure that it is fully opened. Do not climb a closed stepladder. ? Make sure that both sides of the stepladder are locked into place. ? Ask someone to hold the stepladder for you while you use it.  If there are any pets around you, be aware of where they are. What can I do in the bathroom?      Keep the floor dry. Clean up any water that spills onto the floor as soon as it happens.  Remove soap buildup in the tub or shower regularly.  Use non-skid mats or decals on the floor of the tub or shower.  Attach bath mats securely with double-sided, non-slip rug tape.  If you need to sit down in the shower, use a plastic, non-slip stool.  Install grab bars by the toilet and in the tub and shower. Do not use towel bars as grab bars. What can I do in the bedroom?  Make sure that you have a light by your bed that is easy to reach.  Do not use any sheets or blankets that are too big for your bed. They should not hang down onto the floor.  Have a firm chair that has side arms. You can use this for support while you get dressed. What can I do in the kitchen?  Clean up any spills right away.  If you need to reach something above you, use a strong step stool that has  a grab bar.  Keep electrical cords out of the way.  Do not use floor polish or wax that makes floors slippery. If you must use wax, use non-skid floor wax. What can I do with my stairs?  Do not leave any items on the stairs.  Make sure that you have a light switch at the top of the stairs and the bottom of the stairs. If you do not have them, ask someone to add them for you.  Make sure that there are handrails on both sides of the stairs, and use them. Fix handrails that are broken or loose. Make sure that handrails are as long as the stairways.  Install non-slip stair treads on all stairs in your home.  Avoid having throw rugs at the  top or bottom of the stairs. If you do have throw rugs, attach them to the floor with carpet tape.  Choose a carpet that does not hide the edge of the steps on the stairway.  Check any carpeting to make sure that it is firmly attached to the stairs. Fix any carpet that is loose or worn. What can I do on the outside of my home?  Use bright outdoor lighting.  Regularly fix the edges of walkways and driveways and fix any cracks.  Remove anything that might make you trip as you walk through a door, such as a raised step or threshold.  Trim any bushes or trees on the path to your home.  Regularly check to see if handrails are loose or broken. Make sure that both sides of any steps have handrails.  Install guardrails along the edges of any raised decks and porches.  Clear walking paths of anything that might make someone trip, such as tools or rocks.  Have any leaves, snow, or ice cleared regularly.  Use sand or salt on walking paths during winter.  Clean up any spills in your garage right away. This includes grease or oil spills. What other actions can I take?  Wear shoes that: ? Have a low heel. Do not wear high heels. ? Have rubber bottoms. ? Are comfortable and fit you well. ? Are closed at the toe. Do not wear open-toe sandals.  Use tools that help you move around (mobility aids) if they are needed. These include: ? Canes. ? Walkers. ? Scooters. ? Crutches.  Review your medicines with your doctor. Some medicines can make you feel dizzy. This can increase your chance of falling. Ask your doctor what other things you can do to help prevent falls. Where to find more information  Centers for Disease Control and Prevention, STEADI: https://garcia.biz/  [employer] on Aging: BrainJudge.co.uk Contact a doctor if:  You are afraid of falling at home.  You feel weak, drowsy, or dizzy at home.  You fall at home. Summary  There are many simple things that you  can do to make your home safe and to help prevent falls.  Ways to make your home safe include removing tripping hazards and installing grab bars in the bathroom.  Ask for help when making these changes in your home. This information is not intended to replace advice given to you by your health care provider. Make sure you discuss any questions you have with your health care provider. Document Revised: 09/25/2018 Document Reviewed: 01/17/2017 Elsevier Patient Education  2020 [employer].

## 2019-08-16 ENCOUNTER — Ambulatory Visit: Payer: Medicare Other | Attending: Internal Medicine

## 2019-08-16 DIAGNOSIS — Z23 Encounter for immunization: Secondary | ICD-10-CM

## 2019-08-16 NOTE — Progress Notes (Signed)
   Covid-19 Vaccination Clinic  Name:  Tristan Myers    MRN: [id number] DOB: 05-31-1941  08/16/2019  [name] was observed post Covid-19 immunization for 15 minutes without incidence. He was provided with Vaccine Information Sheet and instruction to access the V-Safe system.   [name] was instructed to call 911 with any severe reactions post vaccine: Marland Kitchen Difficulty breathing  . Swelling of your face and throat  . A fast heartbeat  . A bad rash all over your body  . Dizziness and weakness    Immunizations Administered    Name Date Dose VIS Date Route   Pfizer COVID-19 Vaccine 08/16/2019  3:17 PM 0.3 mL 05/29/2019 Intramuscular   Manufacturer: [employer], [employer]   Lot: [id number]   NDC: [address]-1

## 2019-08-27 ENCOUNTER — Other Ambulatory Visit: Payer: Self-pay | Admitting: Internal Medicine

## 2019-09-07 ENCOUNTER — Telehealth: Payer: Self-pay | Admitting: Internal Medicine

## 2019-09-07 ENCOUNTER — Telehealth: Payer: Self-pay | Admitting: Emergency Medicine

## 2019-09-07 NOTE — Telephone Encounter (Signed)
Wife reports patient was unable to send remote transmission. Retail buyer support told the patient and his wife that Dr Graciela Husbands needs to be notfied of patient transfer from Dr York Spaniel practice. Tech support at [facility] contacted and patient will transmit next report in 7 days.

## 2019-09-07 NOTE — Telephone Encounter (Signed)
New message:     Patient wife calling stating that patient use to see Dr. Donnie Aho and now seeing doctor Graciela Husbands and the received an letter from the company stating that if do not call the company to let them know that the patient is now see Dr. Graciela Husbands. Number [phone] [employer].

## 2019-09-15 ENCOUNTER — Ambulatory Visit: Payer: Medicare Other | Attending: Internal Medicine

## 2019-09-15 DIAGNOSIS — Z23 Encounter for immunization: Secondary | ICD-10-CM

## 2019-09-15 NOTE — Progress Notes (Signed)
   Covid-19 Vaccination Clinic  Name:  Tristan Myers    MRN: [id number] DOB: 11-20-1940  09/15/2019  [name] was observed post Covid-19 immunization for 15 minutes without incident. He was provided with Vaccine Information Sheet and instruction to access the V-Safe system.   [name] was instructed to call 911 with any severe reactions post vaccine: Marland Kitchen Difficulty breathing  . Swelling of face and throat  . A fast heartbeat  . A bad rash all over body  . Dizziness and weakness   Immunizations Administered    Name Date Dose VIS Date Route   Pfizer COVID-19 Vaccine 09/15/2019  2:40 PM 0.3 mL 05/29/2019 Intramuscular   Manufacturer: [employer], [employer]   Lot: [id number]   NDC: [address]-4

## 2019-10-12 ENCOUNTER — Ambulatory Visit (INDEPENDENT_AMBULATORY_CARE_PROVIDER_SITE_OTHER): Payer: Medicare Other | Admitting: *Deleted

## 2019-10-12 DIAGNOSIS — I4892 Unspecified atrial flutter: Secondary | ICD-10-CM

## 2019-10-12 DIAGNOSIS — I4891 Unspecified atrial fibrillation: Secondary | ICD-10-CM | POA: Diagnosis not present

## 2019-10-12 LAB — CUP PACEART REMOTE DEVICE CHECK
Battery Remaining Longevity: 48 mo
Battery Remaining Percentage: 52 %
Brady Statistic RV Percent Paced: 0 %
Date Time Interrogation Session: 20210426115700
HighPow Impedance: 50 Ohm
Implantable Lead Implant Date: 19970327
Implantable Lead Location: 753860
Implantable Lead Model: 125
Implantable Lead Serial Number: 209616
Implantable Pulse Generator Implant Date: 20110726
Lead Channel Impedance Value: 697 Ohm
Lead Channel Pacing Threshold Amplitude: 2 V
Lead Channel Pacing Threshold Pulse Width: 1.5 ms
Lead Channel Setting Pacing Amplitude: 4 V
Lead Channel Setting Pacing Pulse Width: 1.5 ms
Lead Channel Setting Sensing Sensitivity: 0.5 mV
Pulse Gen Serial Number: 271249

## 2019-10-12 NOTE — Progress Notes (Signed)
ICD Remote  

## 2020-01-01 ENCOUNTER — Other Ambulatory Visit: Payer: Self-pay | Admitting: Cardiology

## 2020-01-04 MED ORDER — SOTALOL HCL 80 MG PO TABS: ORAL_TABLET | ORAL | 0 refills | Status: DC

## 2020-01-09 ENCOUNTER — Other Ambulatory Visit: Payer: Self-pay | Admitting: Internal Medicine

## 2020-01-11 ENCOUNTER — Ambulatory Visit (INDEPENDENT_AMBULATORY_CARE_PROVIDER_SITE_OTHER): Admitting: *Deleted

## 2020-01-11 ENCOUNTER — Telehealth: Payer: Self-pay

## 2020-01-11 DIAGNOSIS — I472 Ventricular tachycardia, unspecified: Secondary | ICD-10-CM

## 2020-01-11 LAB — CUP PACEART REMOTE DEVICE CHECK
Battery Remaining Longevity: 42 mo
Battery Remaining Percentage: 47 %
Brady Statistic RV Percent Paced: 0 %
Date Time Interrogation Session: 20210726013100
HighPow Impedance: 45 Ohm
Implantable Lead Implant Date: 19970327
Implantable Lead Location: 753860
Implantable Lead Model: 125
Implantable Lead Serial Number: 209616
Implantable Pulse Generator Implant Date: 20110726
Lead Channel Impedance Value: 565 Ohm
Lead Channel Pacing Threshold Amplitude: 2 V
Lead Channel Pacing Threshold Pulse Width: 1.5 ms
Lead Channel Setting Pacing Amplitude: 4 V
Lead Channel Setting Pacing Pulse Width: 1.5 ms
Lead Channel Setting Sensing Sensitivity: 0.5 mV
Pulse Gen Serial Number: 271249

## 2020-01-11 NOTE — Telephone Encounter (Signed)
Olegario Messier called from hospice, verified that patient is no longer taking sotalol. Per Olegario Messier, she is not seeing it in the system, so possiboly was d/c prior to receiving patient into hospice. Patient was admitted into hospice 01/02/20. Asked about if family or staff had discussed and shock therapy plans considering patient was now in hospice care. Olegario Messier was unsure, states she will get in touch with patients provider Dr. Barbee Shropshire and someone will call us back to let us know.  Patient is over due for follow-up.

## 2020-01-11 NOTE — Telephone Encounter (Signed)
Received [employer] alert for shock on 01/08/20 at 09:12 for 2 VT-1 events, 1 EGM's appears initially irregular R-R intervals, becomes regular 170's, ATP delivered with no change, 41j shock terminated. Hx of inappropriate shock.  Called to assess patient, spoke to patients wife Tristan Myers, ([place]). States patient has hx of stroke and at baseline is altered and unable to speak clearly. Per Tristan Myers patient has had no complaints that she has been aware of over the weekend. States patient is in hospice care, Sotalol was stopped last week by MD.   Shock plan reviewed. Patient does not drive per wife  Called patients hospice nurse to verify medication changes as well as discuss shock therapies. Hospice nurses name, Allision. Left message with Diplomatic Services operational officer for Revonda Standard to call DC back. Hospice Facility Phone Number [phone].                Marland Kitchen

## 2020-01-11 NOTE — Telephone Encounter (Signed)
Olegario Messier called back and states Dr. Barbee Shropshire wasn't sure regarding therapies for ICD. States they will "sort it out and get back in touch with [place] after."

## 2020-01-12 NOTE — Progress Notes (Signed)
Remote ICD transmission.   

## 2020-01-28 ENCOUNTER — Ambulatory Visit: Payer: Medicare Other | Admitting: Adult Health

## 2020-01-30 ENCOUNTER — Emergency Department (HOSPITAL_COMMUNITY)

## 2020-01-30 ENCOUNTER — Encounter (HOSPITAL_COMMUNITY): Payer: Self-pay

## 2020-01-30 ENCOUNTER — Other Ambulatory Visit: Payer: Self-pay

## 2020-01-30 ENCOUNTER — Inpatient Hospital Stay (HOSPITAL_COMMUNITY)
Admission: EM | Admit: 2020-01-30 | Discharge: 2020-02-02 | DRG: 923 | Disposition: A | Discharge status: Hospice | Attending: Internal Medicine | Admitting: Internal Medicine

## 2020-01-30 DIAGNOSIS — I4892 Unspecified atrial flutter: Secondary | ICD-10-CM | POA: Diagnosis present

## 2020-01-30 DIAGNOSIS — E785 Hyperlipidemia, unspecified: Secondary | ICD-10-CM | POA: Diagnosis present

## 2020-01-30 DIAGNOSIS — T679XXA Effect of heat and light, unspecified, initial encounter: Secondary | ICD-10-CM

## 2020-01-30 DIAGNOSIS — I5022 Chronic systolic (congestive) heart failure: Secondary | ICD-10-CM | POA: Diagnosis present

## 2020-01-30 DIAGNOSIS — Z833 Family history of diabetes mellitus: Secondary | ICD-10-CM

## 2020-01-30 DIAGNOSIS — Z9109 Other allergy status, other than to drugs and biological substances: Secondary | ICD-10-CM

## 2020-01-30 DIAGNOSIS — E1151 Type 2 diabetes mellitus with diabetic peripheral angiopathy without gangrene: Secondary | ICD-10-CM | POA: Diagnosis present

## 2020-01-30 DIAGNOSIS — I69320 Aphasia following cerebral infarction: Secondary | ICD-10-CM

## 2020-01-30 DIAGNOSIS — R269 Unspecified abnormalities of gait and mobility: Secondary | ICD-10-CM | POA: Diagnosis present

## 2020-01-30 DIAGNOSIS — I69390 Apraxia following cerebral infarction: Secondary | ICD-10-CM

## 2020-01-30 DIAGNOSIS — R4182 Altered mental status, unspecified: Secondary | ICD-10-CM | POA: Diagnosis not present

## 2020-01-30 DIAGNOSIS — Z888 Allergy status to other drugs, medicaments and biological substances status: Secondary | ICD-10-CM

## 2020-01-30 DIAGNOSIS — E860 Dehydration: Secondary | ICD-10-CM | POA: Diagnosis not present

## 2020-01-30 DIAGNOSIS — Z95810 Presence of automatic (implantable) cardiac defibrillator: Secondary | ICD-10-CM

## 2020-01-30 DIAGNOSIS — Z883 Allergy status to other anti-infective agents status: Secondary | ICD-10-CM

## 2020-01-30 DIAGNOSIS — I2510 Atherosclerotic heart disease of native coronary artery without angina pectoris: Secondary | ICD-10-CM | POA: Diagnosis present

## 2020-01-30 DIAGNOSIS — Z66 Do not resuscitate: Secondary | ICD-10-CM | POA: Diagnosis present

## 2020-01-30 DIAGNOSIS — Z20822 Contact with and (suspected) exposure to covid-19: Secondary | ICD-10-CM | POA: Diagnosis present

## 2020-01-30 DIAGNOSIS — T6701XA Heatstroke and sunstroke, initial encounter: Principal | ICD-10-CM

## 2020-01-30 DIAGNOSIS — H409 Unspecified glaucoma: Secondary | ICD-10-CM | POA: Diagnosis present

## 2020-01-30 DIAGNOSIS — Z7982 Long term (current) use of aspirin: Secondary | ICD-10-CM

## 2020-01-30 DIAGNOSIS — N400 Enlarged prostate without lower urinary tract symptoms: Secondary | ICD-10-CM | POA: Diagnosis present

## 2020-01-30 DIAGNOSIS — I480 Paroxysmal atrial fibrillation: Secondary | ICD-10-CM | POA: Diagnosis present

## 2020-01-30 DIAGNOSIS — F0390 Unspecified dementia without behavioral disturbance: Secondary | ICD-10-CM | POA: Diagnosis present

## 2020-01-30 DIAGNOSIS — X30XXXA Exposure to excessive natural heat, initial encounter: Secondary | ICD-10-CM

## 2020-01-30 DIAGNOSIS — Z79899 Other long term (current) drug therapy: Secondary | ICD-10-CM

## 2020-01-30 DIAGNOSIS — Z87891 Personal history of nicotine dependence: Secondary | ICD-10-CM

## 2020-01-30 DIAGNOSIS — Z8249 Family history of ischemic heart disease and other diseases of the circulatory system: Secondary | ICD-10-CM

## 2020-01-30 DIAGNOSIS — Z951 Presence of aortocoronary bypass graft: Secondary | ICD-10-CM

## 2020-01-30 DIAGNOSIS — Z952 Presence of prosthetic heart valve: Secondary | ICD-10-CM

## 2020-01-30 LAB — LACTIC ACID, PLASMA: Lactic Acid, Venous: 1.2 mmol/L (ref 0.5–1.9)

## 2020-01-30 LAB — RAPID URINE DRUG SCREEN, HOSP PERFORMED
Amphetamines: NOT DETECTED
Barbiturates: NOT DETECTED
Benzodiazepines: NOT DETECTED
Cocaine: NOT DETECTED
Opiates: NOT DETECTED
Tetrahydrocannabinol: NOT DETECTED

## 2020-01-30 LAB — CBC WITH DIFFERENTIAL/PLATELET
Abs Immature Granulocytes: 0.06 10*3/uL (ref 0.00–0.07)
Basophils Absolute: 0 10*3/uL (ref 0.0–0.1)
Basophils Relative: 0 %
Eosinophils Absolute: 0 10*3/uL (ref 0.0–0.5)
Eosinophils Relative: 0 %
HCT: 27.4 % — ABNORMAL LOW (ref 39.0–52.0)
Hemoglobin: 8.6 g/dL — ABNORMAL LOW (ref 13.0–17.0)
Immature Granulocytes: 1 %
Lymphocytes Relative: 7 %
Lymphs Abs: 0.6 10*3/uL — ABNORMAL LOW (ref 0.7–4.0)
MCH: 32.1 pg (ref 26.0–34.0)
MCHC: 31.4 g/dL (ref 30.0–36.0)
MCV: 102.2 fL — ABNORMAL HIGH (ref 80.0–100.0)
Monocytes Absolute: 0.5 10*3/uL (ref 0.1–1.0)
Monocytes Relative: 6 %
Neutro Abs: 7.2 10*3/uL (ref 1.7–7.7)
Neutrophils Relative %: 86 %
Platelets: 103 10*3/uL — ABNORMAL LOW (ref 150–400)
RBC: 2.68 MIL/uL — ABNORMAL LOW (ref 4.22–5.81)
RDW: 13.9 % (ref 11.5–15.5)
WBC: 8.4 10*3/uL (ref 4.0–10.5)
nRBC: 0 % (ref 0.0–0.2)

## 2020-01-30 LAB — PROTIME-INR
INR: 1.3 — ABNORMAL HIGH (ref 0.8–1.2)
Prothrombin Time: 15.2 seconds (ref 11.4–15.2)

## 2020-01-30 LAB — URINALYSIS, ROUTINE W REFLEX MICROSCOPIC
Bilirubin Urine: NEGATIVE
Glucose, UA: NEGATIVE mg/dL
Hgb urine dipstick: NEGATIVE
Ketones, ur: NEGATIVE mg/dL
Leukocytes,Ua: NEGATIVE
Nitrite: NEGATIVE
Protein, ur: NEGATIVE mg/dL
Specific Gravity, Urine: 1.013 (ref 1.005–1.030)
pH: 6 (ref 5.0–8.0)

## 2020-01-30 LAB — COMPREHENSIVE METABOLIC PANEL
ALT: 16 U/L (ref 0–44)
AST: 21 U/L (ref 15–41)
Albumin: 2.5 g/dL — ABNORMAL LOW (ref 3.5–5.0)
Alkaline Phosphatase: 34 U/L — ABNORMAL LOW (ref 38–126)
Anion gap: 7 (ref 5–15)
BUN: 18 mg/dL (ref 8–23)
CO2: 22 mmol/L (ref 22–32)
Calcium: 7.7 mg/dL — ABNORMAL LOW (ref 8.9–10.3)
Chloride: 109 mmol/L (ref 98–111)
Creatinine, Ser: 0.83 mg/dL (ref 0.61–1.24)
GFR calc Af Amer: >60 mL/min (ref >60)
GFR calc non Af Amer: >60 mL/min (ref >60)
Glucose, Bld: 153 mg/dL — ABNORMAL HIGH (ref 70–99)
Potassium: 4 mmol/L (ref 3.5–5.1)
Sodium: 138 mmol/L (ref 135–145)
Total Bilirubin: 1.8 mg/dL — ABNORMAL HIGH (ref 0.3–1.2)
Total Protein: 4.9 g/dL — ABNORMAL LOW (ref 6.5–8.1)

## 2020-01-30 LAB — CBG MONITORING, ED: Glucose-Capillary: 142 mg/dL — ABNORMAL HIGH (ref 70–99)

## 2020-01-30 LAB — APTT: aPTT: 35 seconds (ref 24–36)

## 2020-01-30 LAB — POC OCCULT BLOOD, ED: Fecal Occult Bld: POSITIVE — AB

## 2020-01-30 LAB — SARS CORONAVIRUS 2 BY RT PCR (HOSPITAL ORDER, PERFORMED IN ~~LOC~~ HOSPITAL LAB): SARS Coronavirus 2: NEGATIVE

## 2020-01-30 MED ORDER — METRONIDAZOLE IN NACL 5-0.79 MG/ML-% IV SOLN
500.0000 mg | Freq: Once | INTRAVENOUS | Status: AC
  Administered 2020-01-30: 500 mg via INTRAVENOUS
  Filled 2020-01-30: qty 100

## 2020-01-30 MED ORDER — ACETAMINOPHEN 650 MG RE SUPP: 650.0000 mg | Freq: Four times a day (QID) | RECTAL | Status: DC | PRN

## 2020-01-30 MED ORDER — VANCOMYCIN HCL 750 MG/150ML IV SOLN
750.0000 mg | Freq: Two times a day (BID) | INTRAVENOUS | Status: DC
  Filled 2020-01-30: qty 150

## 2020-01-30 MED ORDER — SODIUM CHLORIDE 0.9 % IV SOLN: INTRAVENOUS | Status: AC

## 2020-01-30 MED ORDER — TRIAMCINOLONE ACETONIDE 0.1 % EX CREA
1.0000 "application " | TOPICAL_CREAM | Freq: Two times a day (BID) | CUTANEOUS | Status: DC
  Administered 2020-01-30 – 2020-01-31 (×3): 1 via TOPICAL
  Filled 2020-01-30 (×2): qty 15

## 2020-01-30 MED ORDER — ENOXAPARIN SODIUM 40 MG/0.4ML ~~LOC~~ SOLN
40.0000 mg | SUBCUTANEOUS | Status: DC
  Administered 2020-01-30 – 2020-02-01 (×3): 40 mg via SUBCUTANEOUS
  Filled 2020-01-30 (×3): qty 0.4

## 2020-01-30 MED ORDER — ACETAMINOPHEN 325 MG PO TABS: 650.0000 mg | ORAL_TABLET | Freq: Four times a day (QID) | ORAL | Status: DC | PRN

## 2020-01-30 MED ORDER — LACTATED RINGERS IV BOLUS (SEPSIS)
500.0000 mL | Freq: Once | INTRAVENOUS | Status: AC
  Administered 2020-01-30: 500 mL via INTRAVENOUS

## 2020-01-30 MED ORDER — OCUVITE-LUTEIN PO CAPS
1.0000 | ORAL_CAPSULE | Freq: Every day | ORAL | Status: DC
  Filled 2020-01-30: qty 1

## 2020-01-30 MED ORDER — VANCOMYCIN HCL 1500 MG/300ML IV SOLN
1500.0000 mg | Freq: Once | INTRAVENOUS | Status: AC
  Administered 2020-01-30: 1500 mg via INTRAVENOUS
  Filled 2020-01-30 (×2): qty 300

## 2020-01-30 MED ORDER — SODIUM CHLORIDE 0.9 % IV SOLN: 2.0000 g | Freq: Two times a day (BID) | INTRAVENOUS | Status: DC

## 2020-01-30 MED ORDER — PROSIGHT PO TABS
1.0000 | ORAL_TABLET | Freq: Every day | ORAL | Status: DC
  Administered 2020-01-30 – 2020-02-02 (×3): 1 via ORAL
  Filled 2020-01-30 (×4): qty 1

## 2020-01-30 MED ORDER — SODIUM CHLORIDE 0.9 % IV SOLN
2.0000 g | Freq: Once | INTRAVENOUS | Status: AC
  Administered 2020-01-30: 2 g via INTRAVENOUS
  Filled 2020-01-30: qty 2

## 2020-01-30 MED ORDER — LACTATED RINGERS IV SOLN: INTRAVENOUS | Status: DC

## 2020-01-30 MED ORDER — OXYBUTYNIN CHLORIDE ER 15 MG PO TB24
30.0000 mg | ORAL_TABLET | Freq: Every day | ORAL | Status: DC
  Administered 2020-01-30 – 2020-02-01 (×3): 30 mg via ORAL
  Filled 2020-01-30 (×4): qty 2

## 2020-01-30 MED ORDER — ASPIRIN EC 81 MG PO TBEC
81.0000 mg | DELAYED_RELEASE_TABLET | Freq: Every day | ORAL | Status: DC
  Administered 2020-01-31 – 2020-02-02 (×2): 81 mg via ORAL
  Filled 2020-01-30 (×3): qty 1

## 2020-01-30 MED ORDER — TAMSULOSIN HCL 0.4 MG PO CAPS
0.4000 mg | ORAL_CAPSULE | Freq: Every day | ORAL | Status: DC
  Administered 2020-01-30 – 2020-02-02 (×3): 0.4 mg via ORAL
  Filled 2020-01-30 (×4): qty 1
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
## 2020-01-30 NOTE — ED Notes (Signed)
Dinner tray ordered.

## 2020-01-30 NOTE — Progress Notes (Addendum)
Pharmacy Antibiotic Note  Tristan Myers is a 79 y.o. male admitted on 01/30/2020 with sepsis.  Pharmacy has been consulted for vancomycin and cefepime dosing.  Plan: Vancomycin 1500 mg once, followed by Vancomycin 750 IV every 12 hours.  Goal trough 15-20 mcg/mL.  Cefepime 2 g every 12 hours     Temp (24hrs), Avg:99.9 F (37.7 C), Min:99.9 F (37.7 C), Max:99.9 F (37.7 C)  No results for input(s): WBC, CREATININE, LATICACIDVEN, VANCOTROUGH, VANCOPEAK, VANCORANDOM, GENTTROUGH, GENTPEAK, GENTRANDOM, TOBRATROUGH, TOBRAPEAK, TOBRARND, AMIKACINPEAK, AMIKACINTROU, AMIKACIN in the last 168 hours.  CrCl cannot be calculated (Patient's most recent lab result is older than the maximum 21 days allowed.).    Allergies  Allergen Reactions  . Ace Inhibitors Cough  . Betadine [Povidone Iodine]   . Warfarin And Related Other (See Comments)    BLEEDING (NON-SPECIFIC)    Antimicrobials this admission: Metronidazole 8/14 >> 8/14 Vancomycin 8/14 >>  Cefepime 8/14 >>   Microbiology this Admission: N/a  Thank you for allowing pharmacy to be a part of this patient's care.  Rexford Maus, PharmD PGY-1 Acute Care Pharmacy Resident 01/30/2020 3:22 PM
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
## 2020-01-30 NOTE — ED Provider Notes (Addendum)
[facility]   CSN: [id number] Arrival date & time: 01/30/20  1409     History Chief Complaint  Patient presents with  . Heat Exposure    Larose KellsRobert E Lynam is a 79 y.o. male.  HPI      Level 5 caveat due to lack of nonverbal communication due to stroke.  Larose KellsRobert E Williamsen is a 79 y.o. male, with a history of A. fib, stroke, ICD, DM, DVT, CABG, hyperlipidemia, presenting to the ED with changes in level of consciousness.  Per EMS, patient was found unresponsive outside on the porch.  They noted he was hypotensive in the 80s.  He was dry, tachycardic, and hypothermic to 103.8 F temporal.  They state they did not get very good history from the patient's wife on scene.  They were unable to fully determine if the patient was ill prior to being outside. They administered 1500 cc of normal saline IV and applied ice to the patient's axillas, neck, groin, and feet.  Patient became more responsive and was able to answer yes or no questions.    Past Medical History:  Diagnosis Date  . Atrial fibrillation and flutter (HCC)    on coumadin  . CAD (coronary artery disease)    s/p ICD implant in 1997  . CVA (cerebral vascular accident) (HCC) 12/2009   LEFT PARIETAL WITH APHASIA / APRAXIA  . 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 Diabetes mellitus, type 2 (HCC)   . DVT (deep venous thrombosis) (HCC)    OF LEFT ARM FOLLOWING ICD PLACEMENT  . Glaucoma   . Hyperlipidemia   . ICD (implantable cardioverter-defibrillator)[employer]   . 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 Obesity   . PVD (peripheral vascular disease) (HCC)   . S/P CABG x 1   . Stroke [facility](HCC)    left brain CVA with aphasia 01/03/10  . Ventricular tachycardia (HCC)    Hx of it    Patient Active Problem List   Diagnosis Date Noted  . 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 DNR (do not resuscitate) present on admission 05/27/2019  . Pressure injury of skin 05/27/2019  . History of hemorrhagic cerebrovascular accident (CVA) with residual deficit 05/26/2019  . CVA (cerebral vascular accident) (HCC) 05/26/2019  . CVA (cerebrovascular accident) (HCC) 05/26/2019  . 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 ICD (implantable cardioverter-defibrillator)[employer]   . Atrial fibrillation and flutter (HCC)   . Ventricular tachycardia (HCC)   . CAD (coronary artery disease)   . Ischemic cardiomyopathy 01/02/2011  . 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 VENTRICULAR TACHYCARDIA 04/18/2010    Past Surgical History:  Procedure Laterality Date  . AORTIC VALVE REPLACEMENT (AVR)/CORONARY ARTERY BYPASS GRAFTING (CABG)     W LIMA TO LAD, SVG TO OM1, AND SVG TO RCA 09/03/1995 DR. [id number], AICD IMPLANT MARCH 1997, AICD GENERATOR REPLACEMENT 12/2001  . CARDIAC CATHETERIZATION Left 08/1995  . CATARACT EXTRACTION     OD       Family History  Problem Relation Age of Onset  . Coronary artery disease Other   . Diabetes Other   . Diabetes Mother   . Heart attack Father   . Other Sister  MALIGNANT NEOPLASM OF LIVER    Social History   Tobacco Use  . Smoking status: Former Games developer  . Smokeless tobacco: Never Used  . Tobacco comment: quit in 1997  Vaping Use  . Vaping Use: Never used  Substance Use Topics  . Alcohol use: No  . Drug use: Never    Home Medications Prior to Admission medications   Medication Sig Start Date End Date Taking? Authorizing Provider  aspirin EC 81 MG tablet Take 81 mg by mouth daily.   Yes [provider]  losartan (COZAAR) 25 MG tablet TAKE 1 TABLET BY MOUTH  DAILY Patient taking  differently: Take 25 mg by mouth daily.  01/11/20  Yes Duke Salvia, MD  metoprolol succinate (TOPROL-XL) 25 MG 24 hr tablet TAKE 1 TABLET BY MOUTH  DAILY Patient taking differently: Take 25 mg by mouth daily.  08/28/19  Yes Duke Salvia, MD  Multiple Vitamins-Minerals (PRESERVISION AREDS 2) CAPS Take 1 capsule by mouth daily.   Yes [provider]  oxybutynin (DITROPAN XL) 15 MG 24 hr tablet Take 30 mg by mouth at bedtime.   Yes [provider]  sotalol (BETAPACE) 80 MG tablet Take 1  1/2 tab 2 times daily. Please make yearly appt with Dr. Graciela Husbands for August before anymore refills. 1st attempt Patient taking differently: Take 120 mg by mouth 2 (two) times daily. Take 1  1/2 tab 2 times daily. Please make yearly appt with Dr. Graciela Husbands for August before anymore refills. 1st attempt 01/04/20  Yes Duke Salvia, MD  triamcinolone cream (KENALOG) 0.1 % Apply 1 application topically 4 (four) times daily as needed for rash. 05/22/19  Yes [provider]  insulin glargine (LANTUS) 100 UNIT/ML injection Inject 0.2 mLs (20 Units total) into the skin at bedtime. Sliding Scale Patient not taking: Reported on 01/30/2020 06/02/19   Lanae Boast, MD  rosuvastatin (CRESTOR) 10 MG tablet Take 1 tablet (10 mg total) by mouth 3 (three) times a week. Patient not taking: Reported on 01/30/2020 12/10/18   Duke Salvia, MD  tamsulosin (FLOMAX) 0.4 MG CAPS capsule Take 1 capsule (0.4 mg total) by mouth daily. 06/03/19   Lanae Boast, MD    Allergies    Ace inhibitors, Betadine [povidone iodine], Warfarin and related, and Adhesive [tape]  Review of Systems   Review of Systems  Unable to perform ROS: Patient nonverbal    Physical Exam Updated Vital Signs BP (!) 96/53   Pulse (!) 55   Temp 99.9 F (37.7 C) (Rectal)   Resp 17   SpO2 92%   Physical Exam Vitals and nursing note reviewed.  Constitutional:      General: He is not in acute distress.    Appearance: He is well-developed. He  is not diaphoretic.  HENT:     Head: Normocephalic and atraumatic.     Mouth/Throat:     Mouth: Mucous membranes are dry.     Pharynx: Oropharynx is clear.  Eyes:     Conjunctiva/sclera: Conjunctivae normal.  Cardiovascular:     Rate and Rhythm: Normal rate and regular rhythm.     Pulses: Normal pulses.          Radial pulses are 2+ on the right side and 2+ on the left side.       Posterior tibial pulses are 2+ on the right side and 2+ on the left side.     Heart sounds: Normal heart sounds.     Comments: Tactile temperature  in the extremities appropriate and equal bilaterally. Pulmonary:     Effort: Pulmonary effort is normal. No respiratory distress.     Breath sounds: Normal breath sounds.  Abdominal:     Palpations: Abdomen is soft.     Tenderness: There is no abdominal tenderness. There is no guarding.  Musculoskeletal:     Cervical back: Neck supple.     Right lower leg: No edema.     Left lower leg: No edema.  Lymphadenopathy:     Cervical: No cervical adenopathy.  Skin:    General: Skin is warm and dry.     Comments: Patient does have an area of erythema to the sacral region consistent with the possible start of a pressure injury.  Neurological:     Mental Status: He is alert.     Comments: Patient is largely nonverbal, however, he can answer yes or no questions.  His responses do seem to be accurate and appropriate. He accurately follows commands.   Grip strength equal bilaterally.  Psychiatric:        Mood and Affect: Mood and affect normal.        Speech: Speech normal.        Behavior: Behavior normal.     ED Results / Procedures / Treatments   Labs (all labs ordered are listed, but only abnormal results are displayed) Labs Reviewed  COMPREHENSIVE METABOLIC PANEL - Abnormal; Notable for the following components:      Result Value   Glucose, Bld 153 (*)    Calcium 7.7 (*)    Total Protein 4.9 (*)    Albumin 2.5 (*)    Alkaline Phosphatase 34 (*)     Total Bilirubin 1.8 (*)    All other components within normal limits  CBC WITH DIFFERENTIAL/PLATELET - Abnormal; Notable for the following components:   RBC 2.68 (*)    Hemoglobin 8.6 (*)    HCT 27.4 (*)    MCV 102.2 (*)    Platelets 103 (*)    Lymphs Abs 0.6 (*)    All other components within normal limits  PROTIME-INR - Abnormal; Notable for the following components:   INR 1.3 (*)    All other components within normal limits  CBG MONITORING, ED - Abnormal; Notable for the following components:   Glucose-Capillary 142 (*)    All other components within normal limits  SARS CORONAVIRUS 2 BY RT PCR (HOSPITAL ORDER, PERFORMED IN [facility])  CULTURE, BLOOD (ROUTINE X 2)  CULTURE, BLOOD (ROUTINE X 2)  URINE CULTURE  LACTIC ACID, PLASMA  APTT  URINALYSIS, ROUTINE W REFLEX MICROSCOPIC  RAPID URINE DRUG SCREEN, HOSP PERFORMED  LACTIC ACID, PLASMA  POC OCCULT BLOOD, ED    Hemoglobin  Date Value Ref Range Status  01/30/2020 8.6 (L) 13.0 - 17.0 g/dL Final  87/68/1157 26.2 13.0 - 17.0 g/dL Final  03/55/9741 63.8 (L) 13.0 - 17.0 g/dL Final  45/36/4680 32.1 13.0 - 17.0 g/dL Final  22/48/2500 37.0 (L) 13.0 - 17.7 g/dL Final    EKG EKG Interpretation  Date/Time:  Saturday January 30 2020 14:20:18 EDT Ventricular Rate:  65 PR Interval:    QRS Duration: 167 QT Interval:  522 QTC Calculation: 543 R Axis:   -87 Text Interpretation: Atrial fibrillation Right bundle branch block Inferolateral infarct, old Baseline wander in lead(s) V2 rate slower than previous, A fib vs sinus w/ artifact, needs repeat Confirmed by Frederick Peers [phone]) on 01/30/2020 3:23:25 PM   Radiology CT Head  Wo Contrast  Result Date: 01/30/2020 CLINICAL DATA:  Altered mental status. EXAM: CT HEAD WITHOUT CONTRAST TECHNIQUE: Contiguous axial images were obtained from the base of the skull through the vertex without intravenous contrast. COMPARISON:  Brain CT 05/26/2019 FINDINGS: Brain: Interval  evolution of previously described infarct within the right cerebellar hemisphere. Redemonstrated chronic infarcts involving the right frontal and parietal lobes as well as the left temporoparietal lobe with associated volume loss. Periventricular and subcortical white matter hypodensities compatible with chronic microvascular ischemic changes. No evidence for acute intracranial hemorrhage, mass lesion, mass effect or cortically based infarct. Vascular: Unremarkable Skull: Intact. Sinuses/Orbits: Paranasal sinuses and mastoid air cells are well aerated. Other: None. IMPRESSION: 1. No acute intracranial process. 2. Atrophy and chronic microvascular ischemic changes. 3. Redemonstrated chronic bilateral cortically based infarcts. Electronically Signed   By: Annia Belt M.D.   On: 01/30/2020 15:54   DG Chest Port 1 View  Result Date: 01/30/2020 CLINICAL DATA:  Fever. EXAM: PORTABLE CHEST 1 VIEW COMPARISON:  May 27, 2019 FINDINGS: Multiple sternal wires and vascular clips are seen. A single lead ventricular pacer is noted. There is no evidence of acute infiltrate, pleural effusion or pneumothorax. The cardiac silhouette is moderately enlarged. The visualized skeletal structures are unremarkable. IMPRESSION: 1. Evidence of prior median sternotomy/CABG. 2. Single lead ventricular pacer. 3. No acute or active cardiopulmonary disease. Electronically Signed   By: Aram Candela M.D.   On: 01/30/2020 16:31    Procedures Procedures (including critical care time)  Medications Ordered in ED Medications  lactated ringers infusion ( Intravenous New Bag/Given 01/30/20 1628)  ceFEPIme (MAXIPIME) 2 g in sodium chloride 0.9 % 100 mL IVPB (has no administration in time range)  vancomycin (VANCOREADY) IVPB 1500 mg/300 mL (1,500 mg Intravenous New Bag/Given 01/30/20 1630)  vancomycin (VANCOREADY) IVPB 750 mg/150 mL (has no administration in time range)  lactated ringers bolus 500 mL (0 mLs Intravenous Stopped 01/30/20  1617)  metroNIDAZOLE (FLAGYL) IVPB 500 mg (0 mg Intravenous Stopped 01/30/20 1617)  ceFEPIme (MAXIPIME) 2 g in sodium chloride 0.9 % 100 mL IVPB (0 g Intravenous Stopped 01/30/20 1617)    ED Course  I have reviewed the triage vital signs and the nursing notes.  Pertinent labs & imaging results that were available during my care of the patient were reviewed by me and considered in my medical decision making (see chart for details).  Clinical Course as of Jan 29 1654  Sat Jan 30, 2020  1459 Spoke with patient's wife, Bonita Quin. She states patient accompanied her to the grocery store.  She tried to keep the patient home, however, he insisted on coming with her. Because his unable to ambulate well, she left him in the car.  When she returned to the vehicle about an hour and a half later, she noted the air conditioner nor the vehicle were running.  He seemed to be less responsive than normal. She brought him home and neighbors assisted getting into the front porch.  She states he had 2 shirts on because he is "always cold." She wanted the neighbors to help her get him inside, however, they insisted on calling EMS. I discussed the patient's presentation and vital sign abnormalities with her.  I attempted to convey the seriousness of the patient's current condition. Patient had a stroke affecting his mobility and speech December 2020.  She has been caring for the patient at home since January 2021.  She states patient was put on hospice in July due to decreased  oral intake and decreased overall activity. I discussed options for care for the patient.  She states, "I would have kept him at home because he is on hospice if the neighbors had not called for the paramedics."  He does not want anything artificial really to keep him alive.  If there is something that can turn him around more permanently, then that is okay. We discussed options for care and she is okay with IV fluids and antibiotics, "if it will  help." I explained medications, such as pressors.  She does not think the patient will want treatments like this.  I expressly explained that this course of treatment is okay, but it may result in the patient's death sooner rather than later.  She states she knows this is a possibility.   [SJ]    Clinical Course User Index [SJ] Vivan Vanderveer, Hillard Danker, PA-C   MDM Rules/Calculators/A&P                          Patient presented with report of initial altered mental status, hyperthermia, and hypotension. Due to the patient's initial presentation with hyperthermia and hypotension without definitive recent history to say otherwise, code sepsis was activated and antibiotics were ordered. After we were able to obtain more information, it seems as though patient's presentation probably fits more with heat exposure combined with poor oral intake/dehydration.  I personally reviewed and interpreted the patient's labs and imaging studies. He does have an acute anemia when compared with most recent values.    The abnormal findings were discussed with both the patient and his wife separately.  They both agreed on plan of care which includes no pressors, no blood transfusions, no mechanical support.  We intend to honor the patient's wishes.  He is not stable enough to discharge so he will therefore be admitted for hydration and comfort measures.  Findings and plan of care discussed with Emily Filbert, MD. Dr. Judd Lien personally evaluated and examined this patient.  End of shift patient care handoff report given to Rickey Primus, EM resident.  Plan: Admit to hospital.  Vitals:   01/30/20 1540 01/30/20 1545 01/30/20 1600 01/30/20 1615  BP: (!) 108/48 (!) 94/48 (!) 98/50 (!) 96/53  Pulse:  96 (!) 107 (!) 55  Resp:  (!) 8 12 17   Temp:      TempSrc:      SpO2:  100% 100% 92%     Final Clinical Impression(s) / ED Diagnoses Final diagnoses:  Heat exposure, initial encounter  Dehydration    Rx / DC Orders ED  Discharge Orders    None       01/30/20 1706    02/01/20, PA-C 01/30/20 1706    02/01/20, MD 01/31/20 [phone]

## 2020-01-30 NOTE — ED Notes (Signed)
Swayze Pries Wife [phone] would like an update on the pt

## 2020-01-30 NOTE — ED Provider Notes (Signed)
Transfer of Care Note I assumed care of Tristan Myers on 01/30/2020 at 1600  Briefly, JAHAZIEL FRANCOIS is a 79 y.o. male who:  Has a history of dementia currently on hospice who presents with dehydration.  Patient was actually left in in the car without air conditioning and was found to be altered by his wife.  Temperature 103 with EMS.  Labs currently pending.  Patient received 2 L of fluids.  Will follow up labs with likely admit.  The plan includes:  F/u labs and likely admit   Please refer to the original provider's note for additional information regarding the care of Tristan Myers.  Reassessment: Vital Signs:  The most current vitals were  Vitals:   01/30/20 1903 01/30/20 1905  BP: 109/62   Pulse:  (!) 56  Resp:  14  Temp:    SpO2:  (!) 78%    Hemodynamics:  The patient is hemodynamically stable. Mental Status:  The patient is Alert  Additional MDM: Previous divider spoke extensively with patient's wife who stated that even though he is a hospice patient she would like management for possible reversible causes for his altered mental status.  I feel that his mental status is likely secondary to his exposure from the heat.  Labs looked okay.  Borderline hypotensive with initial blood pressures 80s over 60s with more recent ones 100s over 60s.  Feel at this time the patient will require admission for further management.  Discussed the case with the hospitalist who evaluated the patient and agreed with admission to their service.  Patient was admitted to the hospitalist service in stable condition without further events.    Rickey Primus, MD 01/30/20 1946    Clarene Duke, Ambrose Finland, MD 01/31/20 1017

## 2020-01-31 DIAGNOSIS — F0390 Unspecified dementia without behavioral disturbance: Secondary | ICD-10-CM | POA: Diagnosis present

## 2020-01-31 DIAGNOSIS — Z952 Presence of prosthetic heart valve: Secondary | ICD-10-CM | POA: Diagnosis not present

## 2020-01-31 DIAGNOSIS — I2510 Atherosclerotic heart disease of native coronary artery without angina pectoris: Secondary | ICD-10-CM | POA: Diagnosis present

## 2020-01-31 DIAGNOSIS — Z95810 Presence of automatic (implantable) cardiac defibrillator: Secondary | ICD-10-CM | POA: Diagnosis not present

## 2020-01-31 DIAGNOSIS — I4892 Unspecified atrial flutter: Secondary | ICD-10-CM | POA: Diagnosis present

## 2020-01-31 DIAGNOSIS — E860 Dehydration: Secondary | ICD-10-CM | POA: Diagnosis present

## 2020-01-31 DIAGNOSIS — I5022 Chronic systolic (congestive) heart failure: Secondary | ICD-10-CM | POA: Diagnosis present

## 2020-01-31 DIAGNOSIS — Z833 Family history of diabetes mellitus: Secondary | ICD-10-CM | POA: Diagnosis not present

## 2020-01-31 DIAGNOSIS — Z20822 Contact with and (suspected) exposure to covid-19: Secondary | ICD-10-CM | POA: Diagnosis present

## 2020-01-31 DIAGNOSIS — Z883 Allergy status to other anti-infective agents status: Secondary | ICD-10-CM | POA: Diagnosis not present

## 2020-01-31 DIAGNOSIS — T679XXA Effect of heat and light, unspecified, initial encounter: Secondary | ICD-10-CM | POA: Diagnosis not present

## 2020-01-31 DIAGNOSIS — I69390 Apraxia following cerebral infarction: Secondary | ICD-10-CM | POA: Diagnosis not present

## 2020-01-31 DIAGNOSIS — Z8249 Family history of ischemic heart disease and other diseases of the circulatory system: Secondary | ICD-10-CM | POA: Diagnosis not present

## 2020-01-31 DIAGNOSIS — X30XXXA Exposure to excessive natural heat, initial encounter: Secondary | ICD-10-CM | POA: Diagnosis not present

## 2020-01-31 DIAGNOSIS — Z66 Do not resuscitate: Secondary | ICD-10-CM | POA: Diagnosis present

## 2020-01-31 DIAGNOSIS — I69320 Aphasia following cerebral infarction: Secondary | ICD-10-CM | POA: Diagnosis not present

## 2020-01-31 DIAGNOSIS — Z888 Allergy status to other drugs, medicaments and biological substances status: Secondary | ICD-10-CM | POA: Diagnosis not present

## 2020-01-31 DIAGNOSIS — Z951 Presence of aortocoronary bypass graft: Secondary | ICD-10-CM | POA: Diagnosis not present

## 2020-01-31 DIAGNOSIS — E1151 Type 2 diabetes mellitus with diabetic peripheral angiopathy without gangrene: Secondary | ICD-10-CM | POA: Diagnosis present

## 2020-01-31 DIAGNOSIS — E785 Hyperlipidemia, unspecified: Secondary | ICD-10-CM | POA: Diagnosis present

## 2020-01-31 DIAGNOSIS — N400 Enlarged prostate without lower urinary tract symptoms: Secondary | ICD-10-CM | POA: Diagnosis present

## 2020-01-31 DIAGNOSIS — H409 Unspecified glaucoma: Secondary | ICD-10-CM | POA: Diagnosis present

## 2020-01-31 DIAGNOSIS — I480 Paroxysmal atrial fibrillation: Secondary | ICD-10-CM | POA: Diagnosis present

## 2020-01-31 DIAGNOSIS — R269 Unspecified abnormalities of gait and mobility: Secondary | ICD-10-CM | POA: Diagnosis present

## 2020-01-31 DIAGNOSIS — Z87891 Personal history of nicotine dependence: Secondary | ICD-10-CM | POA: Diagnosis not present

## 2020-01-31 DIAGNOSIS — T6701XA Heatstroke and sunstroke, initial encounter: Secondary | ICD-10-CM | POA: Diagnosis present

## 2020-01-31 DIAGNOSIS — R4182 Altered mental status, unspecified: Secondary | ICD-10-CM | POA: Diagnosis present

## 2020-01-31 LAB — URINE CULTURE: Culture: NO GROWTH

## 2020-01-31 LAB — PHOSPHORUS: Phosphorus: 3.2 mg/dL (ref 2.5–4.6)

## 2020-01-31 LAB — CORTISOL-AM, BLOOD: Cortisol - AM: 11.7 ug/dL (ref 6.7–22.6)

## 2020-01-31 LAB — TSH: TSH: 0.918 u[IU]/mL (ref 0.350–4.500)

## 2020-01-31 LAB — CK: Total CK: 137 U/L (ref 49–397)

## 2020-01-31 LAB — MAGNESIUM: Magnesium: 1.7 mg/dL (ref 1.7–2.4)

## 2020-01-31 MED ORDER — SODIUM CHLORIDE 0.9 % IV SOLN: INTRAVENOUS | Status: DC

## 2020-01-31 NOTE — Progress Notes (Signed)
This RN was called to patient room by NT during feeding lunch tray. Patient states "It won't go down". Patient offered oral liquids after he removed food from mouth, but refused; states he can't swallow well. Rhona Leavens, MD paged for speech consult- orders received.

## 2020-01-31 NOTE — Evaluation (Signed)
Clinical/Bedside Swallow Evaluation Patient Details  Name: ELIO HADEN MRN: [id number] Date of Birth: 1941-06-14  Today's Date: 01/31/2020 Time: SLP Start Time (ACUTE ONLY): 1514 SLP Stop Time (ACUTE ONLY): 1530 SLP Time Calculation (min) (ACUTE ONLY): 16 min  Past Medical History:  Past Medical History:  Diagnosis Date  . Atrial fibrillation and flutter (HCC)    on coumadin  . CAD (coronary artery disease)    s/p ICD implant in 1997  . CVA (cerebral vascular accident) (HCC) 12/2009   LEFT PARIETAL WITH APHASIA / APRAXIA  . 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 Diabetes mellitus, type 2 (HCC)   . DVT (deep venous thrombosis) (HCC)    OF LEFT ARM FOLLOWING ICD PLACEMENT  . Glaucoma   . Hyperlipidemia   . ICD (implantable cardioverter-defibrillator)[employer]   . 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 Obesity   . PVD (peripheral vascular disease) (HCC)   . S/P CABG x 1   . Stroke [facility])    left brain CVA with aphasia 01/03/10  . Ventricular tachycardia (HCC)    Hx of it   Past Surgical History:  Past Surgical History:  Procedure Laterality Date  . AORTIC VALVE REPLACEMENT (AVR)/CORONARY ARTERY BYPASS GRAFTING (CABG)     W LIMA TO LAD, SVG TO OM1, AND SVG TO RCA 09/03/1995 DR. [id number], AICD IMPLANT MARCH 1997, AICD GENERATOR REPLACEMENT 12/2001  . CARDIAC CATHETERIZATION Left 08/1995  . CATARACT EXTRACTION     OD   HPI:  Pt is a 79 yo male presenting with heat stroke. Previous swallow evaluation in December 2020 [facility]. PMH includes: stroke, DM, CAD, afib, dementia, on home hospice since July 2021.   Assessment / Plan / Recommendation Clinical Impression  Pt coughed on his first sip of thin liquid, but showed no other overt s/s of aspiration across intake. His granddaughter says that he will occasionally have trouble taking pills with thin liquids at home, c/b  coughing. He has an impulsive rate with self-feeding, but with Min cues, he can slow himself down and clear food from his mouth completely in between bites. Recommend continuing regular solids and thin liquids but with assistance during meals to monitor for rate of intake and oral clearance in between bites. Would administer meds whole in puree.   SLP Visit Diagnosis: Dysphagia, unspecified (R13.10)    Aspiration Risk  Mild aspiration risk;Moderate aspiration risk    Diet Recommendation Regular;Thin liquid   Liquid Administration via: Cup;Straw Medication Administration: Whole meds with puree Supervision: Patient able to self feed;Full supervision/cueing for compensatory strategies Compensations: Slow rate;Small sips/bites;Other (Comment) (clear mouth in between bites) Postural Changes: Seated upright at 90 degrees    Other  Recommendations Oral Care Recommendations: Oral care BID   Follow up Recommendations 24 hour supervision/assistance      Frequency and Duration            Prognosis        Swallow Study   General HPI: Pt is a 79 yo male presenting with heat stroke. Previous swallow evaluation in December 2020 [facility]. PMH includes: stroke, DM, CAD, afib, dementia, on home hospice since July 2021. Type of Study: Bedside Swallow Evaluation Previous Swallow Assessment: see HPI Diet Prior to this Study: Regular;Thin liquids Temperature Spikes Noted: No Respiratory Status: Room air History of Recent Intubation: No Behavior/Cognition: Alert;Cooperative;Requires cueing;Impulsive Oral Cavity Assessment: Within Functional Limits Oral Care Completed by SLP: No Oral  Cavity - Dentition: Missing dentition Vision: Functional for self-feeding Self-Feeding Abilities: Able to feed self Patient Positioning: Upright in chair Baseline Vocal Quality: Normal    Oral/Motor/Sensory Function     Ice Chips Ice chips: Not tested   Thin Liquid Thin Liquid: Impaired Presentation: Cup;Self  Fed;Straw Pharyngeal  Phase Impairments: Cough - Immediate (x1)    Nectar Thick Nectar Thick Liquid: Not tested   Honey Thick Honey Thick Liquid: Not tested   Puree Puree: Within functional limits Presentation: Self Fed;Spoon   Solid     Solid: Impaired Presentation: Self Fed;Spoon Oral Phase Functional Implications: Oral residue      Mahala Menghini., M.A. CCC-SLP Acute Rehabilitation Services Pager [phone] Office [phone]  01/31/2020,3:53 PM
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
## 2020-01-31 NOTE — Progress Notes (Signed)
Patient's wife, Bonita Quin, called requesting an update on patient. Bonita Quin was update on current plan of care, all questions and concerns were answered. Linda requests to be called by MD with any updates.

## 2020-01-31 NOTE — Plan of Care (Signed)
Pt is alert and oriented X1. Arrived to the unit with ED staff. Skin warm, incontinent of B/B. VS stable. Pt is comfortably in bed. Call bell within reach. No distress noted at this time. Will continue monitoring the patient.
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
## 2020-02-01 DIAGNOSIS — E860 Dehydration: Secondary | ICD-10-CM | POA: Diagnosis not present

## 2020-02-01 DIAGNOSIS — T679XXA Effect of heat and light, unspecified, initial encounter: Secondary | ICD-10-CM | POA: Diagnosis not present

## 2020-02-01 LAB — CBC
HCT: 30.2 % — ABNORMAL LOW (ref 39.0–52.0)
Hemoglobin: 9.6 g/dL — ABNORMAL LOW (ref 13.0–17.0)
MCH: 32.1 pg (ref 26.0–34.0)
MCHC: 31.8 g/dL (ref 30.0–36.0)
MCV: 101 fL — ABNORMAL HIGH (ref 80.0–100.0)
Platelets: 142 10*3/uL — ABNORMAL LOW (ref 150–400)
RBC: 2.99 MIL/uL — ABNORMAL LOW (ref 4.22–5.81)
RDW: 13.8 % (ref 11.5–15.5)
WBC: 9.6 10*3/uL (ref 4.0–10.5)
nRBC: 0 % (ref 0.0–0.2)

## 2020-02-01 LAB — COMPREHENSIVE METABOLIC PANEL
ALT: 14 U/L (ref 0–44)
AST: 17 U/L (ref 15–41)
Albumin: 2.6 g/dL — ABNORMAL LOW (ref 3.5–5.0)
Alkaline Phosphatase: 40 U/L (ref 38–126)
Anion gap: 9 (ref 5–15)
BUN: 13 mg/dL (ref 8–23)
CO2: 26 mmol/L (ref 22–32)
Calcium: 8.7 mg/dL — ABNORMAL LOW (ref 8.9–10.3)
Chloride: 108 mmol/L (ref 98–111)
Creatinine, Ser: 0.69 mg/dL (ref 0.61–1.24)
GFR calc Af Amer: >60 mL/min (ref >60)
GFR calc non Af Amer: >60 mL/min (ref >60)
Glucose, Bld: 116 mg/dL — ABNORMAL HIGH (ref 70–99)
Potassium: 4.1 mmol/L (ref 3.5–5.1)
Sodium: 143 mmol/L (ref 135–145)
Total Bilirubin: 1.2 mg/dL (ref 0.3–1.2)
Total Protein: 5.1 g/dL — ABNORMAL LOW (ref 6.5–8.1)

## 2020-02-01 MED ORDER — LORAZEPAM 0.5 MG PO TABS
0.5000 mg | ORAL_TABLET | ORAL | Status: DC | PRN
  Administered 2020-02-01: 0.5 mg via ORAL
  Filled 2020-02-01: qty 1

## 2020-02-01 MED ORDER — LORAZEPAM 2 MG/ML IJ SOLN: 0.5000 mg | INTRAMUSCULAR | Status: DC | PRN

## 2020-02-01 NOTE — Progress Notes (Signed)
Nurse spoke with pt's wife regarding plan of care. Questions and concerns answered.  Pt is currently sitting in chair with bed alarm on,call bell within reach, and listening to music.  Nurse provided pt with graham crackers and sips of water.  Nurse will continue to monitor pt.

## 2020-02-01 NOTE — Progress Notes (Signed)
Nurse called to room by lab. Pt pulled his PIV. Pt stated 'No more. No more"   RN and NT attempted to take vital signs. Pt reports BP cuff hurts. Pt refused temperature to be taken.

## 2020-02-01 NOTE — Progress Notes (Signed)
[employer] engineer, contracting [facility]) Hopsital Liaison Note     This patient is currently a hospice patient with [facility].  The Hospital Liaison Team will continue to follow for any discharge planning needs and to coordinate continuation of hospice care.    If you have questions or need assistance, please call [phone] or contact the hospital Liaison listed on AMION.     Thank you for the opportunity to participate in this patient's care.     Gillian Scarce, BSN, RN [facility] Liaison   [phone]

## 2020-02-01 NOTE — Progress Notes (Signed)
Palliative Medicine RN Note: Consult order noted for GOC. Upon chart review, PMT NP noted FYI that pt is under hospice care.  I called hospice liaison listed in Amion for [facility], aka [facility] ([facility]) and spoke with [employer]. They were unaware of pt's transfer/admission and will see him later today. At this time, they feel like they can handle GOC conversations, as they are familiar with the patient. Their team will call PMT if they need our help, otherwise, we will not engage.  Margret Chance [place] Alfredo, RN, BSN, [facility] Palliative Medicine Team 02/01/2020 1:10 PM Office [phone]
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
## 2020-02-01 NOTE — Progress Notes (Signed)
This morning nurse attempted to administer morning medications. Patient refused.   Patient refused morning vital signs. Nurse explained importance of having an IV and patient refused. Nurse will continue to monitor. MD aware.
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
## 2020-02-02 DIAGNOSIS — E860 Dehydration: Secondary | ICD-10-CM | POA: Diagnosis not present

## 2020-02-02 DIAGNOSIS — T679XXA Effect of heat and light, unspecified, initial encounter: Secondary | ICD-10-CM | POA: Diagnosis not present
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
## 2020-02-02 NOTE — TOC Transition Note (Signed)
Transition of Care [facility]) - CM/SW Discharge Note   Patient Details  Name: Tristan Myers MRN: [id number] Date of Birth: Jan 01, 1941  Transition of Care [facility]) CM/SW Contact:  Leone Haven, RN Phone Number: 02/02/2020, 12:35 PM   Clinical Narrative:    NCM spoke with wife, to inform her patient is for dc today back home with hospice via [facility].  NCM notified Melissa with [facility].    Final next level of care: Home w Hospice Care Barriers to Discharge: No Barriers Identified   Patient Goals and CMS Choice Patient states their goals for this hospitalization and ongoing recovery are:: home with hospice      Discharge Placement                       Discharge Plan and Services                  DME Agency: NA       HH Arranged: NA          Social Determinants of Health (SDOH) Interventions     Readmission Risk Interventions No flowsheet data found.

## 2020-02-04 LAB — CULTURE, BLOOD (ROUTINE X 2)
Culture: NO GROWTH
Culture: NO GROWTH
Special Requests: ADEQUATE
Special Requests: ADEQUATE

## 2020-02-18 ENCOUNTER — Telehealth (INDEPENDENT_AMBULATORY_CARE_PROVIDER_SITE_OTHER): Admitting: Internal Medicine

## 2020-02-18 ENCOUNTER — Other Ambulatory Visit: Payer: Self-pay

## 2020-02-18 VITALS — BP 120/60 | Temp 96.2°F | Ht 67.0 in | Wt 100.0 lb

## 2020-02-18 DIAGNOSIS — I472 Ventricular tachycardia, unspecified: Secondary | ICD-10-CM

## 2020-02-18 DIAGNOSIS — Z95810 Presence of automatic (implantable) cardiac defibrillator: Secondary | ICD-10-CM | POA: Diagnosis not present

## 2020-02-18 NOTE — Progress Notes (Signed)
Electrophysiology TeleHealth Note   Due to national recommendations of social distancing due to COVID 19, an audio/video telehealth visit is felt to be most appropriate for this patient at this time.  See MyChart message from today for the patient's consent to telehealth for [facility].   Date:  02/18/2020   ID:  Tristan Myers, DOB [birth year], MRN [id number]  Location: patient's home  Provider location: [address] [place]  Evaluation Performed: Follow-up visit  PCP:  Kaleen Mask, MD  Cardiologist:     Electrophysiologist:  SK   Chief Complaint: ICM and VT  History of Present Illness:    Tristan Myers is a 79 y.o. male who presents via audio/video conferencing for a telehealth visit today.  Since last being seen in our clinic for ischemic cardiomyopathy,VT and sotalol  the patient reports major weight loss. >40 lbs at 4 months ( now 100 lbs  Now on Hospice care  Unable to communicate, not eating  The patient denies symptoms of fevers, chills, cough, or new SOB worrisome for COVID 19.    Past Medical History:  Diagnosis Date  . Atrial fibrillation and flutter (HCC)    on coumadin  . CAD (coronary artery disease)    s/p ICD implant in 1997  . CVA (cerebral vascular accident) (HCC) 12/2009   LEFT PARIETAL WITH APHASIA / APRAXIA  . 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 Diabetes mellitus, type 2 (HCC)   . DVT (deep venous thrombosis) (HCC)    OF LEFT ARM FOLLOWING ICD PLACEMENT  . Glaucoma   . Hyperlipidemia   . ICD (implantable cardioverter-defibrillator)[employer]   . 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 Obesity   . PVD (peripheral vascular disease) (HCC)   . S/P CABG x 1   . Stroke [facility])    left brain CVA with aphasia 01/03/10  . Ventricular tachycardia (HCC)    Hx of it    Past Surgical History:  Procedure Laterality  Date  . AORTIC VALVE REPLACEMENT (AVR)/CORONARY ARTERY BYPASS GRAFTING (CABG)     W LIMA TO LAD, SVG TO OM1, AND SVG TO RCA 09/03/1995 DR. [id number], AICD IMPLANT MARCH 1997, AICD GENERATOR REPLACEMENT 12/2001  . CARDIAC CATHETERIZATION Left 08/1995  . CATARACT EXTRACTION     OD    Current Outpatient Medications  Medication Sig Dispense Refill  . aspirin EC 81 MG tablet Take 81 mg by mouth daily.    Marland Kitchen losartan (COZAAR) 25 MG tablet Take 12.5 mg by mouth daily. Half tab qd    . metoprolol succinate (TOPROL-XL) 25 MG 24 hr tablet TAKE 1 TABLET BY MOUTH  DAILY (Patient taking differently: Take 12.5 mg by mouth daily. ) 90 tablet 2  . Multiple Vitamins-Minerals (PRESERVISION AREDS PO) Take 1 tablet by mouth daily.    . sotalol (BETAPACE) 80 MG tablet Take 1  1/2 tab 2 times daily. Please make yearly appt with Dr. Graciela Husbands for August before anymore refills. 1st attempt (Patient taking differently: Take 80 mg by mouth 2 (two) times daily. Take 1  2 times daily.) 270 tablet 0  . triamcinolone cream (KENALOG) 0.1 % Apply 1 application topically 4 (four) times daily as needed for rash.    . oxybutynin (DITROPAN XL) 15 MG 24 hr tablet Take 30 mg by mouth at bedtime. (Patient not taking: Reported on 02/18/2020)    . tamsulosin (FLOMAX)  0.4 MG CAPS capsule Take 1 capsule (0.4 mg total) by mouth daily. (Patient not taking: Reported on 02/18/2020) 30 capsule    No current facility-administered medications for this visit.    Allergies:   Ace inhibitors, Betadine [povidone iodine], Warfarin and related, and Adhesive [tape]   Social History:  The patient  reports that he has quit smoking. He has never used smokeless tobacco. He reports that he does not drink alcohol and does not use drugs.   Family History:  The patient's   family history includes Coronary artery disease in an other family member; Diabetes in his mother and another family member; Heart attack in his father; Other in his sister.   ROS:  Please see  the history of present illness.   All other systems are personally reviewed and negative.    Exam:    Vital Signs:  BP 120/60   Temp (!) 96.2 F (35.7 C)   Ht 5\' 7"  (1.702 m)   Wt 100 lb (45.4 kg)   BMI 15.66 kg/m        Labs/Other Tests and Data Reviewed:    Recent Labs: 01/31/2020: Magnesium 1.7; TSH 0.918 02/01/2020: ALT 14; BUN 13; Creatinine, Ser 0.69; Hemoglobin 9.6; Platelets 142; Potassium 4.1; Sodium 143   Wt Readings from Last 3 Encounters:  02/18/20 100 lb (45.4 kg)  02/02/20 124 lb 12.5 oz (56.6 kg)  07/28/19 144 lb 3.2 oz (65.4 kg)     Other studies personally reviewed:    Last device remote is reviewed from PaceART PDF dated 7/21 which reveals normal device function,   arrhythmias -  With VF and shock 7/21    ASSESSMENT & PLAN:   Ischemic cardiomyopathy  ICD  [employer]        VT remote  Atrial tachy with inappropriate shock   CVA x2, embolic and hemorrhagic  30% weight loss since 4/21  Hospice care  Have discussed with Mrs Lippard about the role of ICD given his HOSPICE status, both in terms of the suffering that could caused and the goals of care.  He has also lost a great deal of weight, a marker of underlying severe illness which does not seem to be relenting and thus would continue its course in the event of successful resuscitation  Device interrogation reports a shock 7/21 but egrams are not available  Will look for   She has asked a little time to consider inactivation of HV therapy for his ICD given his hospice/DNR status       COVID 19 screen The patient denies symptoms of COVID 19 at this time.  The importance of social distancing was discussed today.  Follow-up:  Tuesday telehealth visit  9/7 ( add on) Next remote: As Scheduled   Current medicines are reviewed at length with the patient today.   The patient does not have concerns regarding his medicines.  The following changes were made today:  none  Labs/ tests  ordered today include:   No orders of the defined types were placed in this encounter.   Future tests ( post COVID )     Patient Risk:  after full review of this patients clinical status, I feel that they are at moderate risk at this time.  Today, I have spent 15 minutes with the patient with telehealth technology discussing the above.  Signed, 11/7, MD  02/18/2020 2:34 PM     [facility] [address] [place] [phone] (  office) [phone] (fax)

## 2020-02-18 NOTE — Patient Instructions (Signed)
Called and discussed the following recommendations with patient's spouse.  Medication Instructions:  Your physician recommends that you continue on your current medications as directed. Please refer to the Current Medication list given to you today.  Labwork: None ordered.  Testing/Procedures: None ordered.  Follow-Up: Your physician recommends that you schedule a follow-up appointment in:   Tues Sept 7 @ 1:30 with Dr. Graciela Husbands- Telephone visit  Any Other Special Instructions Will Be Listed Below (If Applicable).     If you need a refill on your cardiac medications before your next appointment, please call your pharmacy.

## 2020-02-23 ENCOUNTER — Other Ambulatory Visit: Payer: Self-pay

## 2020-02-23 ENCOUNTER — Telehealth: Payer: Self-pay | Admitting: Internal Medicine

## 2020-02-23 ENCOUNTER — Telehealth (INDEPENDENT_AMBULATORY_CARE_PROVIDER_SITE_OTHER): Admitting: Internal Medicine

## 2020-02-23 DIAGNOSIS — I472 Ventricular tachycardia, unspecified: Secondary | ICD-10-CM

## 2020-02-23 DIAGNOSIS — Z95810 Presence of automatic (implantable) cardiac defibrillator: Secondary | ICD-10-CM | POA: Diagnosis not present

## 2020-02-23 DIAGNOSIS — I255 Ischemic cardiomyopathy: Secondary | ICD-10-CM

## 2020-02-23 NOTE — Progress Notes (Signed)
Electrophysiology TeleHealth Note   Due to national recommendations of social distancing due to COVID 19, an audio/video telehealth visit is felt to be most appropriate for this patient at this time.  See MyChart message from today for the patient's consent to telehealth for Tristan Myers.   Date:  02/23/2020   ID:  Tristan Myers, DOB 07/14/1940, MRN [id number]  Location: patient's home  Provider location: [address] [place]  Evaluation Performed: Follow-up visit  PCP:  Kaleen Mask, MD  Cardiologist:     Electrophysiologist:  SK   Chief Complaint: ICM and VT  History of Present Illness:    Tristan Myers is a 79 y.o. male who presents via audio/video conferencing for a telehealth visit today.  Since last being seen in our clinic for ischemic cardiomyopathy,VT and sotalol  the patient reports major weight loss. >40 lbs at 4 months ( now 100 lbs  Now on Hospice care  Unable to communicate, not eating  Had discussed last week the issues of ICD inactivation   The patient denies symptoms of fevers, chills, cough, or new SOB worrisome for COVID 19.    Past Medical History:  Diagnosis Date  . Atrial fibrillation and flutter (HCC)    on coumadin  . CAD (coronary artery disease)    s/p ICD implant in 1997  . CVA (cerebral vascular accident) (HCC) 12/2009   LEFT PARIETAL WITH APHASIA / APRAXIA  . 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 Diabetes mellitus, type 2 (HCC)   . DVT (deep venous thrombosis) (HCC)    OF LEFT ARM FOLLOWING ICD PLACEMENT  . Glaucoma   . Hyperlipidemia   . ICD (implantable cardioverter-defibrillator)[employer]   . 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 Obesity   . PVD (peripheral vascular disease) (HCC)   . S/P CABG x 1   . Stroke [facility])    left brain CVA with aphasia 01/03/10  . Ventricular tachycardia (HCC)    Hx  of it    Past Surgical History:  Procedure Laterality Date  . AORTIC VALVE REPLACEMENT (AVR)/CORONARY ARTERY BYPASS GRAFTING (CABG)     W LIMA TO LAD, SVG TO OM1, AND SVG TO RCA 09/03/1995 DR. [id number], AICD IMPLANT MARCH 1997, AICD GENERATOR REPLACEMENT 12/2001  . CARDIAC CATHETERIZATION Left 08/1995  . CATARACT EXTRACTION     OD    Current Outpatient Medications  Medication Sig Dispense Refill  . aspirin EC 81 MG tablet Take 81 mg by mouth daily.    Marland Kitchen losartan (COZAAR) 25 MG tablet Take 12.5 mg by mouth daily. Half tab qd    . metoprolol succinate (TOPROL-XL) 25 MG 24 hr tablet TAKE 1 TABLET BY MOUTH  DAILY (Patient taking differently: Take 12.5 mg by mouth daily. ) 90 tablet 2  . Multiple Vitamins-Minerals (PRESERVISION AREDS PO) Take 1 tablet by mouth daily.    . sotalol (BETAPACE) 80 MG tablet Take 1  1/2 tab 2 times daily. Please make yearly appt with Dr. Graciela Husbands for August before anymore refills. 1st attempt (Patient taking differently: Take 80 mg by mouth 2 (two) times daily. Take 1  2 times daily.) 270 tablet 0  . tamsulosin (FLOMAX) 0.4 MG CAPS capsule Take 1 capsule (0.4 mg total) by mouth daily. 30 capsule   . triamcinolone cream (KENALOG) 0.1 % Apply 1 application topically 4 (four) times daily as needed for rash.    Marland Kitchen  oxybutynin (DITROPAN XL) 15 MG 24 hr tablet Take 30 mg by mouth at bedtime. (Patient not taking: Reported on 02/23/2020)     No current facility-administered medications for this visit.    Allergies:   Ace inhibitors, Betadine [povidone iodine], Warfarin and related, and Adhesive [tape]   Social History:  The patient  reports that he has quit smoking. He has never used smokeless tobacco. He reports that he does not drink alcohol and does not use drugs.   Family History:  The patient's   family history includes Coronary artery disease in an other family member; Diabetes in his mother and another family member; Heart attack in his father; Other in his sister.    ROS:  Please see the history of present illness.   All other systems are personally reviewed and negative.    Exam:    Vital Signs:  There were no vitals taken for this visit.       Labs/Other Tests and Data Reviewed:    Recent Labs: 01/31/2020: Magnesium 1.7; TSH 0.918 02/01/2020: ALT 14; BUN 13; Creatinine, Ser 0.69; Hemoglobin 9.6; Platelets 142; Potassium 4.1; Sodium 143   Wt Readings from Last 3 Encounters:  02/18/20 100 lb (45.4 kg)  02/02/20 124 lb 12.5 oz (56.6 kg)  07/28/19 144 lb 3.2 oz (65.4 kg)     Other studies personally reviewed:    Last device remote is reviewed from [employer] PDF dated 7/21 which reveals normal device function,   arrhythmias -  With VF and shock 7/21    ASSESSMENT & PLAN:   Ischemic cardiomyopathy  ICD  [employer]        VT remote  Atrial tachy with inappropriate shock   CVA x2, embolic and hemorrhagic  30% weight loss since 4/21  Hospice care  Have discussed with Mrs Wanner   Have decided to inactivate HV therapy   Will go BSx to do       COVID 19 screen The patient denies symptoms of COVID 19 at this time.  The importance of social distancing was discussed today.  Follow-up:  None Next remote: As Scheduled   Current medicines are reviewed at length with the patient today.   The patient does not have concerns regarding his medicines.  The following changes were made today:  none  Labs/ tests ordered today include:   No orders of the defined types were placed in this encounter.   Future tests ( post COVID )     Patient Risk:  after full review of this patients clinical status, I feel that they are at moderate risk at this time.  Today, I have spent 10 minutes with the patient with telehealth technology discussing the above.  Signed, Sherryl Manges, MD  02/23/2020 2:02 PM     [facility] [address] [place] [address] [phone] (office) [phone] (fax)

## 2020-02-23 NOTE — Telephone Encounter (Signed)
Patient's daughter called and stated that she is calling to give patient's vitals. Please call back

## 2020-02-24 NOTE — Telephone Encounter (Signed)
Received vital signs from Joey, ICD industry rep who was at pt's house for assistance with ICD.

## 2020-03-13 ENCOUNTER — Other Ambulatory Visit: Payer: Self-pay | Admitting: Internal Medicine

## 2020-03-26 ENCOUNTER — Other Ambulatory Visit: Payer: Self-pay | Admitting: Internal Medicine

## 2020-05-04 ENCOUNTER — Other Ambulatory Visit: Payer: Self-pay | Admitting: Internal Medicine

## 2020-08-16 DEATH — deceased
